# Patient Record
Sex: Female | Born: 1990 | Race: Black or African American | Hispanic: No | Marital: Married | Smoking: Never smoker
Health system: Southern US, Community
[De-identification: ages and names within clinical notes are randomized; demographics above are authoritative.]

## PROBLEM LIST (undated history)

## (undated) DIAGNOSIS — O039 Complete or unspecified spontaneous abortion without complication: Secondary | ICD-10-CM

## (undated) DIAGNOSIS — F419 Anxiety disorder, unspecified: Secondary | ICD-10-CM

## (undated) DIAGNOSIS — D649 Anemia, unspecified: Secondary | ICD-10-CM

## (undated) DIAGNOSIS — R7303 Prediabetes: Secondary | ICD-10-CM

## (undated) DIAGNOSIS — I10 Essential (primary) hypertension: Secondary | ICD-10-CM

## (undated) HISTORY — DX: Complete or unspecified spontaneous abortion without complication: O03.9

## (undated) HISTORY — DX: Anemia, unspecified: D64.9

## (undated) HISTORY — PX: TOOTH EXTRACTION: SUR596

## (undated) HISTORY — DX: Essential (primary) hypertension: I10

---

## 2012-05-14 ENCOUNTER — Emergency Department (HOSPITAL_COMMUNITY)
Admission: EM | Admit: 2012-05-14 | Discharge: 2012-05-15 | Disposition: A | Discharge status: Home / Self Care | Payer: BC Managed Care – PPO | Attending: Emergency Medicine | Admitting: Emergency Medicine

## 2012-05-14 ENCOUNTER — Emergency Department (HOSPITAL_COMMUNITY): Payer: BC Managed Care – PPO

## 2012-05-14 ENCOUNTER — Encounter (HOSPITAL_COMMUNITY): Payer: Self-pay

## 2012-05-14 DIAGNOSIS — R52 Pain, unspecified: Secondary | ICD-10-CM | POA: Insufficient documentation

## 2012-05-14 DIAGNOSIS — R079 Chest pain, unspecified: Secondary | ICD-10-CM | POA: Insufficient documentation

## 2012-05-14 DIAGNOSIS — J069 Acute upper respiratory infection, unspecified: Secondary | ICD-10-CM

## 2012-05-14 DIAGNOSIS — J029 Acute pharyngitis, unspecified: Secondary | ICD-10-CM | POA: Insufficient documentation

## 2012-05-14 DIAGNOSIS — M542 Cervicalgia: Secondary | ICD-10-CM | POA: Insufficient documentation

## 2012-05-14 DIAGNOSIS — J3489 Other specified disorders of nose and nasal sinuses: Secondary | ICD-10-CM | POA: Insufficient documentation

## 2012-05-14 LAB — BASIC METABOLIC PANEL
BUN: 6 mg/dL (ref 6–23)
Chloride: 101 mEq/L (ref 96–112)
GFR calc non Af Amer: >90 mL/min (ref >90)
Glucose, Bld: 72 mg/dL (ref 70–99)
Potassium: 3.9 mEq/L (ref 3.5–5.1)
Sodium: 135 mEq/L (ref 135–145)

## 2012-05-14 LAB — CBC
HCT: 34.2 % — ABNORMAL LOW (ref 36.0–46.0)
Hemoglobin: 11.1 g/dL — ABNORMAL LOW (ref 12.0–15.0)
RBC: 4.79 MIL/uL (ref 3.87–5.11)
WBC: 6.3 10*3/uL (ref 4.0–10.5)

## 2012-05-14 MED ORDER — IBUPROFEN 800 MG PO TABS
800.0000 mg | ORAL_TABLET | ORAL | Status: AC
  Administered 2012-05-14: 800 mg via ORAL
  Filled 2012-05-14: qty 1

## 2012-05-14 MED ORDER — IBUPROFEN 800 MG PO TABS: 800.0000 mg | ORAL_TABLET | Freq: Three times a day (TID) | ORAL | Status: DC

## 2012-05-14 MED ORDER — ACETAMINOPHEN-CODEINE 120-12 MG/5ML PO SUSP
5.0000 mL | Freq: Four times a day (QID) | ORAL | Status: DC | PRN
Start: 2012-05-14 — End: 2016-12-19

## 2012-05-14 MED ORDER — ACETAMINOPHEN-CODEINE 120-12 MG/5ML PO SUSP: 5.0000 mL | Freq: Four times a day (QID) | ORAL | Status: DC | PRN

## 2012-05-14 MED ORDER — ACETAMINOPHEN 325 MG PO TABS
650.0000 mg | ORAL_TABLET | Freq: Once | ORAL | Status: AC
  Administered 2012-05-14: 650 mg via ORAL
  Filled 2012-05-14: qty 2

## 2012-05-14 NOTE — ED Notes (Signed)
Patient presents with c/o neck pain that radiates down back and left knee as well as a productive cough x 3 days. Reports green sputum. Unsure of any fevers. Endorses sweats, chills and chest tightness. Denies any N/V, diarrhea, constipation, abd pain, headaches or dizziness.

## 2012-05-14 NOTE — ED Provider Notes (Addendum)
History     CSN: [id number]  Arrival date & time 05/14/12  1921   None     Chief Complaint  Patient presents with  . Cough  . Neck Pain    (Consider location/radiation/quality/duration/timing/severity/associated sxs/prior treatment) HPI HX per PT.  Sick x 3 days with sore throat, chills, body caches and not feeling well. Mod in severity. Dry cough, no sputum production.  Has chest pain sharp with cough only.  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 No pertinent past medical history.  History reviewed. No pertinent past surgical history.  No family history on file.  History  Substance Use Topics  . Smoking status: Never Smoker   . Smokeless tobacco: Never Used  . Alcohol Use: Yes    OB History   Grav Para Term Preterm Abortions TAB SAB Ect Mult Living                  Review of Systems  Constitutional: Positive for fever and chills.  HENT: Positive for congestion and sore throat. Negative for neck pain and neck stiffness.   Eyes: Negative for pain.  Respiratory: Positive for cough. Negative for shortness of breath and wheezing.   Cardiovascular: Positive for chest pain.  Gastrointestinal: Negative for abdominal pain.  Genitourinary: Negative for dysuria.  Musculoskeletal: Negative for back pain.  Skin: Negative for rash.  Neurological: Negative for headaches.  All other systems reviewed and are negative.    Allergies  Review of patient's allergies indicates no known allergies.  Home Medications  No current outpatient prescriptions on file.  BP 154/100  Pulse 100  Temp(Src) 101.2 F (38.4 C) (Oral)  Resp 18  SpO2 100%  LMP 05/09/2012  Physical Exam  Constitutional: She is oriented to person, place, and time. She appears well-developed and well-nourished.  HENT:  Head: Normocephalic and atraumatic.  Mouth/Throat: Oropharynx is clear and moist. No oropharyngeal exudate.  Uvula midline, TMS clear, nasal  congestion  Eyes: Conjunctivae and EOM are normal. Pupils are equal, round, and reactive to light. No scleral icterus.  Neck: Normal range of motion. Neck supple. No tracheal deviation present. No thyromegaly present.  Cardiovascular: Normal rate, regular rhythm and intact distal pulses.   Pulmonary/Chest: Effort normal and breath sounds normal. No respiratory distress. She exhibits no tenderness.  Abdominal: Soft. Bowel sounds are normal. She exhibits no distension. There is no tenderness.  Musculoskeletal: Normal range of motion. She exhibits no edema and no tenderness.  Lymphadenopathy:    She has no cervical adenopathy.  Neurological: She is alert and oriented to person, place, and time.  Skin: Skin is warm and dry. No rash noted.    ED Course  Procedures (including critical care time)  Results for orders placed during the hospital encounter of 05/14/12  CBC      Result Value Range   WBC 6.3  4.0 - 10.5 K/uL   RBC 4.79  3.87 - 5.11 MIL/uL   Hemoglobin 11.1 (*) 12.0 - 15.0 g/dL   HCT 57.8 (*) 46.9 - 62.9 %   MCV 71.4 (*) 78.0 - 100.0 fL   MCH 23.2 (*) 26.0 - 34.0 pg   MCHC 32.5  30.0 - 36.0 g/dL   RDW 52.8  41.3 - 24.4 %   Platelets 353  150 - 400 K/uL  BASIC METABOLIC PANEL      Result Value Range   Sodium 135  135 - 145 mEq/L   Potassium 3.9  3.5 - 5.1 mEq/L   Chloride 101  96 - 112 mEq/L   CO2 21  19 - 32 mEq/L   Glucose, Bld 72  70 - 99 mg/dL   BUN 6  6 - 23 mg/dL   Creatinine, Ser 1.61  0.50 - 1.10 mg/dL   Calcium 9.0  8.4 - 09.6 mg/dL   GFR calc non Af Amer >90  >90 mL/min   GFR calc Af Amer >90  >90 mL/min   Dg Chest 2 View  05/14/2012  *RADIOLOGY REPORT*  Clinical Data:  Cough, chest pain and back pain.  CHEST - 2 VIEW  Comparison: None  Findings: The heart size and mediastinal contours are within normal limits.  Both lungs are clear.  The visualized skeletal structures are unremarkable.  IMPRESSION: No active disease.   Original Report Authenticated By: Irish Lack, M.D.       Date: 05/14/2012  Rate: 92  Rhythm: normal sinus rhythm  QRS Axis: normal  Intervals: normal  ST/T Wave abnormalities: nonspecific ST changes  Conduction Disutrbances:none  Narrative Interpretation:   Old EKG Reviewed: none available  Motrin and tylenol  MDM  URI symptoms  Medications provided  CXR, labs  WN and precautions provided  VS and nursing notes reviewed        Sunnie Nielsen, MD 05/14/12 2324  Sunnie Nielsen, MD 05/16/12 [phone]

## 2012-05-15 NOTE — ED Notes (Signed)
Rx x 2.  Pt voiced understanding to f/u with PCP and return for worsening condition.  

## 2014-01-27 ENCOUNTER — Other Ambulatory Visit: Payer: Self-pay | Admitting: Obstetrics and Gynecology

## 2014-01-27 DIAGNOSIS — N644 Mastodynia: Secondary | ICD-10-CM

## 2014-01-29 ENCOUNTER — Ambulatory Visit
Admission: RE | Admit: 2014-01-29 | Discharge: 2014-01-29 | Disposition: A | Discharge status: Home / Self Care | Payer: BC Managed Care – PPO | Source: Ambulatory Visit | Attending: Obstetrics and Gynecology | Admitting: Obstetrics and Gynecology

## 2014-01-29 DIAGNOSIS — N644 Mastodynia: Secondary | ICD-10-CM

## 2014-12-29 ENCOUNTER — Encounter (HOSPITAL_COMMUNITY): Payer: Self-pay | Admitting: *Deleted

## 2014-12-29 ENCOUNTER — Emergency Department (HOSPITAL_COMMUNITY)
Admission: EM | Admit: 2014-12-29 | Discharge: 2014-12-29 | Disposition: A | Discharge status: Home / Self Care | Payer: Self-pay | Attending: Emergency Medicine | Admitting: Emergency Medicine

## 2014-12-29 DIAGNOSIS — S99922A Unspecified injury of left foot, initial encounter: Secondary | ICD-10-CM | POA: Insufficient documentation

## 2014-12-29 DIAGNOSIS — Y9389 Activity, other specified: Secondary | ICD-10-CM | POA: Insufficient documentation

## 2014-12-29 DIAGNOSIS — S3991XA Unspecified injury of abdomen, initial encounter: Secondary | ICD-10-CM | POA: Insufficient documentation

## 2014-12-29 DIAGNOSIS — Y999 Unspecified external cause status: Secondary | ICD-10-CM | POA: Insufficient documentation

## 2014-12-29 DIAGNOSIS — Y92410 Unspecified street and highway as the place of occurrence of the external cause: Secondary | ICD-10-CM | POA: Insufficient documentation

## 2014-12-29 DIAGNOSIS — E669 Obesity, unspecified: Secondary | ICD-10-CM | POA: Insufficient documentation

## 2014-12-29 MED ORDER — METHOCARBAMOL 500 MG PO TABS: 500.0000 mg | ORAL_TABLET | Freq: Two times a day (BID) | ORAL | Status: DC

## 2014-12-29 MED ORDER — IBUPROFEN 600 MG PO TABS: 600.0000 mg | ORAL_TABLET | Freq: Four times a day (QID) | ORAL | Status: DC | PRN
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
## 2014-12-29 NOTE — ED Notes (Signed)
Cartner, PA at bedside.
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
## 2016-12-18 NOTE — Progress Notes (Signed)
Subjective:    Patient ID: Shelby Chandler, female    DOB: [birth year], 26 y.o.   MRN: [id number] Chief Complaint  Patient presents with  . Annual Exam    HPI  Shelby Chandler is a 26 yo woman who is new to our office (and Epic system) here today for a complete physical. Moved here from [place], [place] 6 yrs ago and never found a dr and has not had a dr since. Is not fasting - had a smoothie.  Primary Preventative Screenings: Cervical Cancer:  No h/o abnml - last pap was 2016 (off of [facility]) Family Planning: currently not active, uses condoms.  Used OCPs when mom made her around 89-16 yo for <1 mo but none since. Periods reg -4d heavy, 5th light, scheduled. No vag d/c.  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 Does not have rx for valtrex at home avail but would like to discuss. Would like STI screening today. Breast Cancer: NA due to age Colorectal Cancer: NA due to age Tobacco use/EtOH/substances: none, occ glass of wine. 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 BMI Readings from Last 3 Encounters:  12/29/14 59.88 kg/m   No results found for: HGBA1C OTC/Vit/Supp/Herbal: b12, biotin. Gets plentry of calcium sources in her diet.  Dentist/Optho: due for optho in next few months. Dentist in ~ 1 yr Immunizations: unsure if she received the HPV series - got all standard vaccines up until 11-20 yo. Remembers getting a vaccine at her pre-college CPE - likely TDaP.   There is  no immunization history on file for this patient.  Father w/ DM and CAD. Mother w/ lupus, RA, and mental illness.  Chronic Medical Conditions: ? IDA - pt not sure. Is vegetarian. H/o low iron when dontes blood but never started supplementing.   History reviewed. No pertinent past medical history. History reviewed. No pertinent surgical history. No current outpatient medications on file prior to visit.   No current facility-administered medications on file prior to visit.    No Known Allergies History reviewed. No pertinent family history. Social History   Socioeconomic History  . Marital status: Single    Spouse name: None  . Number of children: None  . Years of education: None  . Highest education level: None  Social Needs  . Financial resource strain: None  . Food insecurity - worry: None  . Food insecurity - inability: None  . Transportation needs - medical: None  . Transportation needs - non-medical: None  Occupational History  . None  Tobacco Use  . Smoking status: Never Smoker  . Smokeless tobacco: Never Used  Substance and Sexual Activity  . Alcohol use: Yes    Comment: social  . Drug use: No  . Sexual activity: Not Currently    Birth control/protection: None  Other Topics Concern  . None  Social History Narrative  . None   No flowsheet data found.    Review of Systems See  hpi    Objective:   Physical Exam  Constitutional: She is oriented to person, place, and time. She appears well-developed and well-nourished. No distress.  HENT:  Head: Normocephalic and atraumatic.  Right Ear: Tympanic membrane, external ear and ear canal normal.  Left Ear: Tympanic membrane, external ear and ear canal normal.  Nose: Mucosal edema present. No rhinorrhea.  Mouth/Throat: Uvula is midline, oropharynx is clear and moist and mucous membranes are normal. No posterior oropharyngeal erythema.  Eyes: Conjunctivae and EOM are normal. Pupils are equal, round, and reactive  to light. Right eye exhibits no discharge. Left eye exhibits no discharge. No scleral icterus.  Neck: Normal range of motion. Neck supple. No thyromegaly present.  Cardiovascular: Normal rate, regular rhythm, normal heart sounds and intact distal pulses.  Pulmonary/Chest: Effort normal and breath sounds normal. No respiratory distress.  Abdominal: Soft. Bowel sounds are normal. There is no tenderness.  Genitourinary: Vagina normal and uterus normal. No breast swelling, tenderness, discharge or bleeding. Cervix exhibits no motion tenderness and no friability. Right adnexum displays no mass and no tenderness. Left adnexum displays no mass and no tenderness.  Musculoskeletal: She exhibits no edema.  Lymphadenopathy:    She has no cervical adenopathy.  Neurological: She is alert and oriented to person, place, and time. She has normal reflexes.  Skin: Skin is warm and dry. She is not diaphoretic. No erythema.  Psychiatric: She has a normal mood and affect. Her behavior is normal.      Pulse 80   Temp 99 F (37.2 C)   Resp 16   Ht 5\' 6"  (1.676 m)   Wt (!) 383 lb (173.7 kg)   SpO2 100%   BMI 61.82 kg/m      Assessment & Plan:  Ua, tsh, cbc, cmp, lipid, a1c, ferritin  Pap w/ reflex to HPV, std testing Refuses Flu shot, but will do tdap - pt did not get today - left before it was done - do next visit.  1. Annual physical exam   2. Routine screening for STI (sexually transmitted infection)   3. Screening for cardiovascular, respiratory, and genitourinary diseases   4. Screening for cervical cancer   5. Screening for deficiency anemia   6. Screening for thyroid disorder   7. Class 3 severe obesity due to excess calories without serious comorbidity with body mass index (BMI) of 60.0 to 69.9 in adult ([place])   8. Anemia, unspecified type   9. HSV-2 infection     Orders Placed This Encounter  Procedures  . Tdap vaccine greater than or equal to 7yo IM  . TSH  . CBC with  Differential/Platelet  . Comprehensive metabolic panel    Order Specific Question:   Has the patient fasted?    Answer:   Yes  . Ferritin  . Hemoglobin A1c  . HIV antibody  . HCV Ab w/Rflx to Verification  . RPR  . RPR  . HIV antibody  . HCV Ab w/Rflx to Verification  . Interpretation:  . POCT urinalysis dipstick    Meds ordered this encounter  Medications  . valACYclovir (VALTREX) 1000 MG tablet    Sig: Take 1 tablet (1,000 mg total) daily for 5 days by mouth. At first sign of outbreak    Dispense:  20 tablet    Refill:  1  . triamterene-hydrochlorothiazide (MAXZIDE-25) 37.5-25 MG tablet    Sig: Take 1 tablet daily by mouth.    Dispense:  30 tablet    Refill:  [place], M.D.  [facility] [address] [place], [place] [address] [phone] phone [phone] fax  12/21/16 11:11 PM

## 2016-12-19 ENCOUNTER — Other Ambulatory Visit: Payer: Self-pay

## 2016-12-19 ENCOUNTER — Ambulatory Visit (INDEPENDENT_AMBULATORY_CARE_PROVIDER_SITE_OTHER): Payer: Managed Care, Other (non HMO) | Admitting: Family Medicine

## 2016-12-19 ENCOUNTER — Encounter: Payer: Self-pay | Admitting: Family Medicine

## 2016-12-19 VITALS — BP 164/108 | HR 80 | Temp 99.0°F | Resp 16 | Ht 66.0 in | Wt 383.0 lb

## 2016-12-19 DIAGNOSIS — Z1389 Encounter for screening for other disorder: Secondary | ICD-10-CM

## 2016-12-19 DIAGNOSIS — Z0000 Encounter for general adult medical examination without abnormal findings: Secondary | ICD-10-CM | POA: Diagnosis not present

## 2016-12-19 DIAGNOSIS — Z1329 Encounter for screening for other suspected endocrine disorder: Secondary | ICD-10-CM

## 2016-12-19 DIAGNOSIS — Z130 Encounter for screening for diseases of the blood and blood-forming organs and certain disorders involving the immune mechanism: Secondary | ICD-10-CM

## 2016-12-19 DIAGNOSIS — Z6841 Body Mass Index (BMI) 40.0 and over, adult: Secondary | ICD-10-CM

## 2016-12-19 DIAGNOSIS — Z1383 Encounter for screening for respiratory disorder NEC: Secondary | ICD-10-CM | POA: Diagnosis not present

## 2016-12-19 DIAGNOSIS — Z124 Encounter for screening for malignant neoplasm of cervix: Secondary | ICD-10-CM

## 2016-12-19 DIAGNOSIS — E66813 Obesity, class 3: Secondary | ICD-10-CM

## 2016-12-19 DIAGNOSIS — Z113 Encounter for screening for infections with a predominantly sexual mode of transmission: Secondary | ICD-10-CM | POA: Diagnosis not present

## 2016-12-19 DIAGNOSIS — Z136 Encounter for screening for cardiovascular disorders: Secondary | ICD-10-CM | POA: Diagnosis not present

## 2016-12-19 DIAGNOSIS — D649 Anemia, unspecified: Secondary | ICD-10-CM

## 2016-12-19 DIAGNOSIS — B009 Herpesviral infection, unspecified: Secondary | ICD-10-CM

## 2016-12-19 LAB — POCT URINALYSIS DIP (MANUAL ENTRY)
BILIRUBIN UA: NEGATIVE mg/dL
Bilirubin, UA: NEGATIVE
GLUCOSE UA: NEGATIVE mg/dL
Leukocytes, UA: NEGATIVE
Nitrite, UA: NEGATIVE
PROTEIN UA: NEGATIVE mg/dL
RBC UA: NEGATIVE
Spec Grav, UA: 1.02 (ref 1.010–1.025)
UROBILINOGEN UA: 0.2 U/dL
pH, UA: 6.5 (ref 5.0–8.0)

## 2016-12-19 MED ORDER — TRIAMTERENE-HCTZ 37.5-25 MG PO TABS: 1.0000 | ORAL_TABLET | Freq: Every day | ORAL | 1 refills | Status: DC

## 2016-12-19 MED ORDER — VALACYCLOVIR HCL 1 G PO TABS: 1000.0000 mg | ORAL_TABLET | Freq: Every day | ORAL | 1 refills | Status: AC

## 2016-12-19 NOTE — Patient Instructions (Addendum)
If you would like to talk more and address contraception, your back pain, weight loss or anything else we BRIEFLY touched on/mentioned today - come back to see me at any time. We do have evening and weekend appointments available.   Start the fluid pill once a day in the morning. Recheck with me 3 weeks to discuss your response, make sure it is the right pill for you in the right dose and check an EKG to ensure there has been no signs of strain in your heart. Please come FASTING to that appointment - nothing to eat for 8-12 hours before hand, but DO take your medication that morning with water.   Hypertension Hypertension, commonly called high blood pressure, is when the force of blood pumping through the arteries is too strong. The arteries are the blood vessels that carry blood from the heart throughout the body. Hypertension forces the heart to work harder to pump blood and may cause arteries to become narrow or stiff. Having untreated or uncontrolled hypertension can cause heart attacks, strokes, kidney disease, and other problems. A blood pressure reading consists of a higher number over a lower number. Ideally, your blood pressure should be below 120/80. The first ("top") number is called the systolic pressure. It is a measure of the pressure in your arteries as your heart beats. The second ("bottom") number is called the diastolic pressure. It is a measure of the pressure in your arteries as the heart relaxes. What are the causes? The cause of this condition is not known. What increases the risk? Some risk factors for high blood pressure are under your control. Others are not. Factors you can change  Smoking.  Having type 2 diabetes mellitus, high cholesterol, or both.  Not getting enough exercise or physical activity.  Being overweight.  Having too much fat, sugar, calories, or salt (sodium) in your diet.  Drinking too much alcohol. Factors that are difficult or impossible to  change  Having chronic kidney disease.  Having a family history of high blood pressure.  Age. Risk increases with age.  Race. You may be at higher risk if you are African-American.  Gender. Men are at higher risk than women before age 94. After age 46, women are at higher risk than men.  Having obstructive sleep apnea.  Stress. What are the signs or symptoms? Extremely high blood pressure (hypertensive crisis) may cause:  Headache.  Anxiety.  Shortness of breath.  Nosebleed.  Nausea and vomiting.  Severe chest pain.  Jerky movements you cannot control (seizures).  How is this diagnosed? This condition is diagnosed by measuring your blood pressure while you are seated, with your arm resting on a surface. The cuff of the blood pressure monitor will be placed directly against the skin of your upper arm at the level of your heart. It should be measured at least twice using the same arm. Certain conditions can cause a difference in blood pressure between your right and left arms. Certain factors can cause blood pressure readings to be lower or higher than normal (elevated) for a short period of time:  When your blood pressure is higher when you are in a health care provider's office than when you are at home, this is called white coat hypertension. Most people with this condition do not need medicines.  When your blood pressure is higher at home than when you are in a health care provider's office, this is called masked hypertension. Most people with this condition may need  medicines to control blood pressure.  If you have a high blood pressure reading during one visit or you have normal blood pressure with other risk factors:  You may be asked to return on a different day to have your blood pressure checked again.  You may be asked to monitor your blood pressure at home for 1 week or longer.  If you are diagnosed with hypertension, you may have other blood or imaging tests  to help your health care provider understand your overall risk for other conditions. How is this treated? This condition is treated by making healthy lifestyle changes, such as eating healthy foods, exercising more, and reducing your alcohol intake. Your health care provider may prescribe medicine if lifestyle changes are not enough to get your blood pressure under control, and if:  Your systolic blood pressure is above 130.  Your diastolic blood pressure is above 80.  Your personal target blood pressure may vary depending on your medical conditions, your age, and other factors. Follow these instructions at home: Eating and drinking  Eat a diet that is high in fiber and potassium, and low in sodium, added sugar, and fat. An example eating plan is called the DASH (Dietary Approaches to Stop Hypertension) diet. To eat this way: ? Eat plenty of fresh fruits and vegetables. Try to fill half of your plate at each meal with fruits and vegetables. ? Eat whole grains, such as whole wheat pasta, brown rice, or whole grain bread. Fill about one quarter of your plate with whole grains. ? Eat or drink low-fat dairy products, such as skim milk or low-fat yogurt. ? Avoid fatty cuts of meat, processed or cured meats, and poultry with skin. Fill about one quarter of your plate with lean proteins, such as fish, chicken without skin, beans, eggs, and tofu. ? Avoid premade and processed foods. These tend to be higher in sodium, added sugar, and fat.  Reduce your daily sodium intake. Most people with hypertension should eat less than 1,500 mg of sodium a day.  Limit alcohol intake to no more than 1 drink a day for nonpregnant women and 2 drinks a day for men. One drink equals 12 oz of beer, 5 oz of wine, or 1 oz of hard liquor. Lifestyle  Work with your health care provider to maintain a healthy body weight or to lose weight. Ask what an ideal weight is for you.  Get at least 30 minutes of exercise that  causes your heart to beat faster (aerobic exercise) most days of the week. Activities may include walking, swimming, or biking.  Include exercise to strengthen your muscles (resistance exercise), such as pilates or lifting weights, as part of your weekly exercise routine. Try to do these types of exercises for 30 minutes at least 3 days a week.  Do not use any products that contain nicotine or tobacco, such as cigarettes and e-cigarettes. If you need help quitting, ask your health care provider.  Monitor your blood pressure at home as told by your health care provider.  Keep all follow-up visits as told by your health care provider. This is important. Medicines  Take over-the-counter and prescription medicines only as told by your health care provider. Follow directions carefully. Blood pressure medicines must be taken as prescribed.  Do not skip doses of blood pressure medicine. Doing this puts you at risk for problems and can make the medicine less effective.  Ask your health care provider about side effects or reactions to medicines  that you should watch for. Contact a health care provider if:  You think you are having a reaction to a medicine you are taking.  You have headaches that keep coming back (recurring).  You feel dizzy.  You have swelling in your ankles.  You have trouble with your vision. Get help right away if:  You develop a severe headache or confusion.  You have unusual weakness or numbness.  You feel faint.  You have severe pain in your chest or abdomen.  You vomit repeatedly.  You have trouble breathing. Summary  Hypertension is when the force of blood pumping through your arteries is too strong. If this condition is not controlled, it may put you at risk for serious complications.  Your personal target blood pressure may vary depending on your medical conditions, your age, and other factors. For most people, a normal blood pressure is less than  120/80.  Hypertension is treated with lifestyle changes, medicines, or a combination of both. Lifestyle changes include weight loss, eating a healthy, low-sodium diet, exercising more, and limiting alcohol. This information is not intended to replace advice given to you by your health care provider. Make sure you discuss any questions you have with your health care provider. Document Released: 01/24/2005 Document Revised: 12/23/2015 Document Reviewed: 12/23/2015 Elsevier Interactive Patient Education  2018 [place] Maintenance, Female Adopting a healthy lifestyle and getting preventive care can go a long way to promote health and wellness. Talk with your health care provider about what schedule of regular examinations is right for you. This is a good chance for you to check in with your provider about disease prevention and staying healthy. In between checkups, there are plenty of things you can do on your own. Experts have done a lot of research about which lifestyle changes and preventive measures are most likely to keep you healthy. Ask your health care provider for more information. Weight and diet Eat a healthy diet  Be sure to include plenty of vegetables, fruits, low-fat dairy products, and lean protein.  Do not eat a lot of foods high in solid fats, added sugars, or salt.  Get regular exercise. This is one of the most important things you can do for your health. ? Most adults should exercise for at least 150 minutes each week. The exercise should increase your heart rate and make you sweat (moderate-intensity exercise). ? Most adults should also do strengthening exercises at least twice a week. This is in addition to the moderate-intensity exercise.  Maintain a healthy weight  Body mass index (BMI) is a measurement that can be used to identify possible weight problems. It estimates body fat based on height and weight. Your health care provider can help determine your  BMI and help you achieve or maintain a healthy weight.  For females 76 years of age and older: ? A BMI below 18.5 is considered underweight. ? A BMI of 18.5 to 24.9 is normal. ? A BMI of 25 to 29.9 is considered overweight. ? A BMI of 30 and above is considered obese.  Watch levels of cholesterol and blood lipids  You should start having your blood tested for lipids and cholesterol at 27 years of age, then have this test every 5 years.  You may need to have your cholesterol levels checked more often if: ? Your lipid or cholesterol levels are high. ? You are older than 27 years of age. ? You are at high risk for heart disease.  Cancer screening Lung Cancer  Lung cancer screening is recommended for adults 55-28 years old who are at high risk for lung cancer because of a history of smoking.  A yearly low-dose CT scan of the lungs is recommended for people who: ? Currently smoke. ? Have quit within the past 15 years. ? Have at least a 30-pack-year history of smoking. A pack year is smoking an average of one pack of cigarettes a day for 1 year.  Yearly screening should continue until it has been 15 years since you quit.  Yearly screening should stop if you develop a health problem that would prevent you from having lung cancer treatment.  Breast Cancer  Practice breast self-awareness. This means understanding how your breasts normally appear and feel.  It also means doing regular breast self-exams. Let your health care provider know about any changes, no matter how small.  If you are in your 20s or 30s, you should have a clinical breast exam (CBE) by a health care provider every 1-3 years as part of a regular health exam.  If you are 30 or older, have a CBE every year. Also consider having a breast X-ray (mammogram) every year.  If you have a family history of breast cancer, talk to your health care provider about genetic screening.  If you are at high risk for breast cancer,  talk to your health care provider about having an MRI and a mammogram every year.  Breast cancer gene (BRCA) assessment is recommended for women who have family members with BRCA-related cancers. BRCA-related cancers include: ? Breast. ? Ovarian. ? Tubal. ? Peritoneal cancers.  Results of the assessment will determine the need for genetic counseling and BRCA1 and BRCA2 testing.  Cervical Cancer Your health care provider may recommend that you be screened regularly for cancer of the pelvic organs (ovaries, uterus, and vagina). This screening involves a pelvic examination, including checking for microscopic changes to the surface of your cervix (Pap test). You may be encouraged to have this screening done every 3 years, beginning at age 62.  For women ages 1-65, health care providers may recommend pelvic exams and Pap testing every 3 years, or they may recommend the Pap and pelvic exam, combined with testing for human papilloma virus (HPV), every 5 years. Some types of HPV increase your risk of cervical cancer. Testing for HPV may also be done on women of any age with unclear Pap test results.  Other health care providers may not recommend any screening for nonpregnant women who are considered low risk for pelvic cancer and who do not have symptoms. Ask your health care provider if a screening pelvic exam is right for you.  If you have had past treatment for cervical cancer or a condition that could lead to cancer, you need Pap tests and screening for cancer for at least 20 years after your treatment. If Pap tests have been discontinued, your risk factors (such as having a new sexual partner) need to be reassessed to determine if screening should resume. Some women have medical problems that increase the chance of getting cervical cancer. In these cases, your health care provider may recommend more frequent screening and Pap tests.  Colorectal Cancer  This type of cancer can be detected and often  prevented.  Routine colorectal cancer screening usually begins at 27 years of age and continues through 27 years of age.  Your health care provider may recommend screening at an earlier age if you have risk factors  for colon cancer.  Your health care provider may also recommend using home test kits to check for hidden blood in the stool.  A small camera at the end of a tube can be used to examine your colon directly (sigmoidoscopy or colonoscopy). This is done to check for the earliest forms of colorectal cancer.  Routine screening usually begins at age 19.  Direct examination of the colon should be repeated every 5-10 years through 27 years of age. However, you may need to be screened more often if early forms of precancerous polyps or small growths are found.  Skin Cancer  Check your skin from head to toe regularly.  Tell your health care provider about any new moles or changes in moles, especially if there is a change in a mole's shape or color.  Also tell your health care provider if you have a mole that is larger than the size of a pencil eraser.  Always use sunscreen. Apply sunscreen liberally and repeatedly throughout the day.  Protect yourself by wearing long sleeves, pants, a wide-brimmed hat, and sunglasses whenever you are outside.  Heart disease, diabetes, and high blood pressure  High blood pressure causes heart disease and increases the risk of stroke. High blood pressure is more likely to develop in: ? People who have blood pressure in the high end of the normal range (130-139/85-89 mm Hg). ? People who are overweight or obese. ? People who are African American.  If you are 76-46 years of age, have your blood pressure checked every 3-5 years. If you are 64 years of age or older, have your blood pressure checked every year. You should have your blood pressure measured twice-once when you are at a hospital or clinic, and once when you are not at a hospital or clinic.  Record the average of the two measurements. To check your blood pressure when you are not at a hospital or clinic, you can use: ? An automated blood pressure machine at a pharmacy. ? A home blood pressure monitor.  If you are between 60 years and 54 years old, ask your health care provider if you should take aspirin to prevent strokes.  Have regular diabetes screenings. This involves taking a blood sample to check your fasting blood sugar level. ? If you are at a normal weight and have a low risk for diabetes, have this test once every three years after 27 years of age. ? If you are overweight and have a high risk for diabetes, consider being tested at a younger age or more often. Preventing infection Hepatitis B  If you have a higher risk for hepatitis B, you should be screened for this virus. You are considered at high risk for hepatitis B if: ? You were born in a country where hepatitis B is common. Ask your health care provider which countries are considered high risk. ? Your parents were born in a high-risk country, and you have not been immunized against hepatitis B (hepatitis B vaccine). ? You have HIV or AIDS. ? You use needles to inject street drugs. ? You live with someone who has hepatitis B. ? You have had sex with someone who has hepatitis B. ? You get hemodialysis treatment. ? You take certain medicines for conditions, including cancer, organ transplantation, and autoimmune conditions.  Hepatitis C  Blood testing is recommended for: ? Everyone born from [birth year] through 1966. ? Anyone with known risk factors for hepatitis C.  Sexually transmitted infections (STIs)  You  should be screened for sexually transmitted infections (STIs) including gonorrhea and chlamydia if: ? You are sexually active and are younger than 27 years of age. ? You are older than 27 years of age and your health care provider tells you that you are at risk for this type of infection. ? Your sexual  activity has changed since you were last screened and you are at an increased risk for chlamydia or gonorrhea. Ask your health care provider if you are at risk.  If you do not have HIV, but are at risk, it may be recommended that you take a prescription medicine daily to prevent HIV infection. This is called pre-exposure prophylaxis (PrEP). You are considered at risk if: ? You are sexually active and do not regularly use condoms or know the HIV status of your partner(s). ? You take drugs by injection. ? You are sexually active with a partner who has HIV.  Talk with your health care provider about whether you are at high risk of being infected with HIV. If you choose to begin PrEP, you should first be tested for HIV. You should then be tested every 3 months for as long as you are taking PrEP. Pregnancy  If you are premenopausal and you may become pregnant, ask your health care provider about preconception counseling.  If you may become pregnant, take 400 to 800 micrograms (mcg) of folic acid every day.  If you want to prevent pregnancy, talk to your health care provider about birth control (contraception). Osteoporosis and menopause  Osteoporosis is a disease in which the bones lose minerals and strength with aging. This can result in serious bone fractures. Your risk for osteoporosis can be identified using a bone density scan.  If you are 44 years of age or older, or if you are at risk for osteoporosis and fractures, ask your health care provider if you should be screened.  Ask your health care provider whether you should take a calcium or vitamin D supplement to lower your risk for osteoporosis.  Menopause may have certain physical symptoms and risks.  Hormone replacement therapy may reduce some of these symptoms and risks. Talk to your health care provider about whether hormone replacement therapy is right for you. Follow these instructions at home:  Schedule regular health, dental,  and eye exams.  Stay current with your immunizations.  Do not use any tobacco products including cigarettes, chewing tobacco, or electronic cigarettes.  If you are pregnant, do not drink alcohol.  If you are breastfeeding, limit how much and how often you drink alcohol.  Limit alcohol intake to no more than 1 drink per day for nonpregnant women. One drink equals 12 ounces of beer, 5 ounces of wine, or 1 ounces of hard liquor.  Do not use street drugs.  Do not share needles.  Ask your health care provider for help if you need support or information about quitting drugs.  Tell your health care provider if you often feel depressed.  Tell your health care provider if you have ever been abused or do not feel safe at home. This information is not intended to replace advice given to you by your health care provider. Make sure you discuss any questions you have with your health care provider. Document Released: 08/09/2010 Document Revised: 07/02/2015 Document Reviewed: 10/28/2014 Elsevier Interactive Patient Education  2018 [employer]. Contraception Choices Contraception (birth control) is the use of any methods or devices to prevent pregnancy. Below are some methods to help  avoid pregnancy. Hormonal methods  Contraceptive implant. This is a thin, plastic tube containing progesterone hormone. It does not contain estrogen hormone. Your health care provider inserts the tube in the inner part of the upper arm. The tube can remain in place for up to 3 years. After 3 years, the implant must be removed. The implant prevents the ovaries from releasing an egg (ovulation), thickens the cervical mucus to prevent sperm from entering the uterus, and thins the lining of the inside of the uterus.  Progesterone-only injections. These injections are given every 3 months by your health care provider to prevent pregnancy. This synthetic progesterone hormone stops the ovaries from releasing eggs. It also  thickens cervical mucus and changes the uterine lining. This makes it harder for sperm to survive in the uterus.  Birth control pills. These pills contain estrogen and progesterone hormone. They work by preventing the ovaries from releasing eggs (ovulation). They also cause the cervical mucus to thicken, preventing the sperm from entering the uterus. Birth control pills are prescribed by a health care provider.Birth control pills can also be used to treat heavy periods.  Minipill. This type of birth control pill contains only the progesterone hormone. They are taken every day of each month and must be prescribed by your health care provider.  Birth control patch. The patch contains hormones similar to those in birth control pills. It must be changed once a week and is prescribed by a health care provider.  Vaginal ring. The ring contains hormones similar to those in birth control pills. It is left in the vagina for 3 weeks, removed for 1 week, and then a new one is put back in place. The patient must be comfortable inserting and removing the ring from the vagina.A health care provider's prescription is necessary.  Emergency contraception. Emergency contraceptives prevent pregnancy after unprotected sexual intercourse. This pill can be taken right after sex or up to 5 days after unprotected sex. It is most effective the sooner you take the pills after having sexual intercourse. Most emergency contraceptive pills are available without a prescription. Check with your pharmacist. Do not use emergency contraception as your only form of birth control. Barrier methods  Female condom. This is a thin sheath (latex or rubber) that is worn over the penis during sexual intercourse. It can be used with spermicide to increase effectiveness.  Female condom. This is a soft, loose-fitting sheath that is put into the vagina before sexual intercourse.  Diaphragm. This is a soft, latex, dome-shaped barrier that must be  fitted by a health care provider. It is inserted into the vagina, along with a spermicidal jelly. It is inserted before intercourse. The diaphragm should be left in the vagina for 6 to 8 hours after intercourse.  Cervical cap. This is a round, soft, latex or plastic cup that fits over the cervix and must be fitted by a health care provider. The cap can be left in place for up to 48 hours after intercourse.  Sponge. This is a soft, circular piece of polyurethane foam. The sponge has spermicide in it. It is inserted into the vagina after wetting it and before sexual intercourse.  Spermicides. These are chemicals that kill or block sperm from entering the cervix and uterus. They come in the form of creams, jellies, suppositories, foam, or tablets. They do not require a prescription. They are inserted into the vagina with an applicator before having sexual intercourse. The process must be repeated every time you have  sexual intercourse. Intrauterine contraception  Intrauterine device (IUD). This is a T-shaped device that is put in a woman's uterus during a menstrual period to prevent pregnancy. There are 2 types: ? Copper IUD. This type of IUD is wrapped in copper wire and is placed inside the uterus. Copper makes the uterus and fallopian tubes produce a fluid that kills sperm. It can stay in place for 10 years. ? Hormone IUD. This type of IUD contains the hormone progestin (synthetic progesterone). The hormone thickens the cervical mucus and prevents sperm from entering the uterus, and it also thins the uterine lining to prevent implantation of a fertilized egg. The hormone can weaken or kill the sperm that get into the uterus. It can stay in place for 3-5 years, depending on which type of IUD is used. Permanent methods of contraception  Female tubal ligation. This is when the woman's fallopian tubes are surgically sealed, tied, or blocked to prevent the egg from traveling to the uterus.  Hysteroscopic  sterilization. This involves placing a small coil or insert into each fallopian tube. Your doctor uses a technique called hysteroscopy to do the procedure. The device causes scar tissue to form. This results in permanent blockage of the fallopian tubes, so the sperm cannot fertilize the egg. It takes about 3 months after the procedure for the tubes to become blocked. You must use another form of birth control for these 3 months.  Female sterilization. This is when the female has the tubes that carry sperm tied off (vasectomy).This blocks sperm from entering the vagina during sexual intercourse. After the procedure, the man can still ejaculate fluid (semen). Natural planning methods  Natural family planning. This is not having sexual intercourse or using a barrier method (condom, diaphragm, cervical cap) on days the woman could become pregnant.  Calendar method. This is keeping track of the length of each menstrual cycle and identifying when you are fertile.  Ovulation method. This is avoiding sexual intercourse during ovulation.  Symptothermal method. This is avoiding sexual intercourse during ovulation, using a thermometer and ovulation symptoms.  Post-ovulation method. This is timing sexual intercourse after you have ovulated. Regardless of which type or method of contraception you choose, it is important that you use condoms to protect against the transmission of sexually transmitted infections (STIs). Talk with your health care provider about which form of contraception is most appropriate for you. This information is not intended to replace advice given to you by your health care provider. Make sure you discuss any questions you have with your health care provider. Document Released: 01/24/2005 Document Revised: 07/02/2015 Document Reviewed: 07/19/2012 Elsevier Interactive Patient Education  2017 [employer].  Exercising to [employer] Exercising can help you to lose weight. In order to lose  weight through exercise, you need to do vigorous-intensity exercise. You can tell that you are exercising with vigorous intensity if you are breathing very hard and fast and cannot hold a conversation while exercising. Moderate-intensity exercise helps to maintain your current weight. You can tell that you are exercising at a moderate level if you have a higher heart rate and faster breathing, but you are still able to hold a conversation. How often should I exercise? Choose an activity that you enjoy and set realistic goals. Your health care provider can help you to make an activity plan that works for you. Exercise regularly as directed by your health care provider. This may include:  Doing resistance training twice each week, such as: ? Push-ups. ?  Sit-ups. ? Lifting weights. ? Using resistance bands.  Doing a given intensity of exercise for a given amount of time. Choose from these options: ? 150 minutes of moderate-intensity exercise every week. ? 75 minutes of vigorous-intensity exercise every week. ? A mix of moderate-intensity and vigorous-intensity exercise every week.  Children, pregnant women, people who are out of shape, people who are overweight, and older adults may need to consult a health care provider for individual recommendations. If you have any sort of medical condition, be sure to consult your health care provider before starting a new exercise program. What are some activities that can help me to lose weight?  Walking at a rate of at least 4.5 miles an hour.  Jogging or running at a rate of 5 miles per hour.  Biking at a rate of at least 10 miles per hour.  Lap swimming.  Roller-skating or in-line skating.  Cross-country skiing.  Vigorous competitive sports, such as football, basketball, and soccer.  Jumping rope.  Aerobic dancing. How can I be more active in my day-to-day activities?  Use the stairs instead of the elevator.  Take a walk during your  lunch break.  If you drive, park your car farther away from work or school.  If you take public transportation, get off one stop early and walk the rest of the way.  Make all of your phone calls while standing up and walking around.  Get up, stretch, and walk around every 30 minutes throughout the day. What guidelines should I follow while exercising?  Do not exercise so much that you hurt yourself, feel dizzy, or get very short of breath.  Consult your health care provider prior to starting a new exercise program.  Wear comfortable clothes and shoes with good support.  Drink plenty of water while you exercise to prevent dehydration or heat stroke. Body water is lost during exercise and must be replaced.  Work out until you breathe faster and your heart beats faster. This information is not intended to replace advice given to you by your health care provider. Make sure you discuss any questions you have with your health care provider. Document Released: 02/26/2010 Document Revised: 07/02/2015 Document Reviewed: 06/27/2013 Elsevier Interactive Patient Education  [employer].

## 2016-12-20 LAB — HEMOGLOBIN A1C
Est. average glucose Bld gHb Est-mCnc: 111 mg/dL
Hgb A1c MFr Bld: 5.5 % (ref 4.8–5.6)

## 2016-12-20 LAB — COMPREHENSIVE METABOLIC PANEL
A/G RATIO: 1.2 (ref 1.2–2.2)
ALK PHOS: 61 IU/L (ref 39–117)
ALT: 9 IU/L (ref 0–32)
AST: 11 IU/L (ref 0–40)
Albumin: 4.2 g/dL (ref 3.5–5.5)
BILIRUBIN TOTAL: 0.2 mg/dL (ref 0.0–1.2)
BUN/Creatinine Ratio: 8 — ABNORMAL LOW (ref 9–23)
BUN: 6 mg/dL (ref 6–20)
CO2: 24 mmol/L (ref 20–29)
Calcium: 9.3 mg/dL (ref 8.7–10.2)
Chloride: 100 mmol/L (ref 96–106)
Creatinine, Ser: 0.79 mg/dL (ref 0.57–1.00)
GFR calc Af Amer: 119 mL/min/{1.73_m2} (ref >59)
GFR, EST NON AFRICAN AMERICAN: 103 mL/min/{1.73_m2} (ref >59)
GLOBULIN, TOTAL: 3.6 g/dL (ref 1.5–4.5)
Glucose: 77 mg/dL (ref 65–99)
POTASSIUM: 3.9 mmol/L (ref 3.5–5.2)
SODIUM: 141 mmol/L (ref 134–144)
Total Protein: 7.8 g/dL (ref 6.0–8.5)

## 2016-12-20 LAB — CBC WITH DIFFERENTIAL/PLATELET
BASOS: 0 %
Basophils Absolute: 0 10*3/uL (ref 0.0–0.2)
EOS (ABSOLUTE): 0.1 10*3/uL (ref 0.0–0.4)
EOS: 1 %
HEMATOCRIT: 33.7 % — AB (ref 34.0–46.6)
Hemoglobin: 10.6 g/dL — ABNORMAL LOW (ref 11.1–15.9)
Immature Grans (Abs): 0 10*3/uL (ref 0.0–0.1)
Immature Granulocytes: 0 %
LYMPHS ABS: 2.1 10*3/uL (ref 0.7–3.1)
Lymphs: 37 %
MCH: 21.2 pg — AB (ref 26.6–33.0)
MCHC: 31.5 g/dL (ref 31.5–35.7)
MCV: 68 fL — AB (ref 79–97)
MONOS ABS: 0.5 10*3/uL (ref 0.1–0.9)
Monocytes: 8 %
Neutrophils Absolute: 3 10*3/uL (ref 1.4–7.0)
Neutrophils: 54 %
Platelets: 512 10*3/uL — ABNORMAL HIGH (ref 150–379)
RBC: 4.99 x10E6/uL (ref 3.77–5.28)
RDW: 17 % — AB (ref 12.3–15.4)
WBC: 5.6 10*3/uL (ref 3.4–10.8)

## 2016-12-20 LAB — RPR: RPR: NONREACTIVE

## 2016-12-20 LAB — FERRITIN: FERRITIN: 9 ng/mL — AB (ref 15–150)

## 2016-12-20 LAB — HIV ANTIBODY (ROUTINE TESTING W REFLEX): HIV Screen 4th Generation wRfx: NONREACTIVE

## 2016-12-20 LAB — HCV AB W/RFLX TO VERIFICATION: HCV Ab: <0.1 s/co ratio (ref 0.0–0.9)

## 2016-12-20 LAB — TSH: TSH: 2.5 u[IU]/mL (ref 0.450–4.500)

## 2016-12-20 LAB — HCV INTERPRETATION

## 2016-12-21 LAB — PAP IG, CT-NG, RFX HPV ASCU
CHLAMYDIA, NUC. ACID AMP: NEGATIVE
GONOCOCCUS BY NUCLEIC ACID AMP: NEGATIVE
PAP Smear Comment: 0

## 2016-12-26 ENCOUNTER — Ambulatory Visit: Payer: Self-pay | Admitting: *Deleted

## 2016-12-26 NOTE — Telephone Encounter (Signed)
I was in on [place] (Nov 12th) and had a pelvic exam by Dr. Brigitte Pulse.   During the exam I felt something pop and I was hurting.   Ever since then about every day I get a sharp abd pain that radiates from left to right across my abd that doubles me over.  It lasts about 5 minutes then goes away. I got an appt with Ivar Drape, PA-C for Nov. 21st (at pt request) at 9:20am.  Instructed to call back if she becomes worse.   She verbalized understanding.  Reason for Disposition . [1] MODERATE pain (e.g., interferes with normal activities) AND [2] pain comes and goes (cramps) AND [3] present > 24 hours  (Exception: pain with Vomiting or Diarrhea - see that Guideline)  Answer Assessment - Initial Assessment Questions 1. LOCATION: "Where does it hurt?"      I had a PAP smear done on Nov. 12th.  Ever since she did the 2 finger exam I felt like something popped and it has hurt on and off since.   It's a sharp pain that goes across my abd and makes me double over in pain.   2. RADIATION: "Does the pain shoot anywhere else?" (e.g., chest, back)     Just in my abd from left to right across my abd. 3. ONSET: "When did the pain begin?" (e.g., minutes, hours or days ago)      Nov 12th during my vaginal exam 4. SUDDEN: "Gradual or sudden onset?"     Sudden pain that comes and goes.  It's sharp for about 5 minutes then it goes away. 5. PATTERN "Does the pain come and go, or is it constant?"    - If constant: "Is it getting better, staying the same, or worsening?"      (Note: Constant means the pain never goes away completely; most serious pain is constant and it progresses)     - If intermittent: "How long does it last?" "Do you have pain now?"     (Note: Intermittent means the pain goes away completely between bouts)     Comes and and go  Happens about once a day since the exam.  It like a twisting pain. 6. SEVERITY: "How bad is the pain?"  (e.g., Scale 1-10; mild, moderate, or severe)   - MILD (1-3):  doesn't interfere with normal activities, abdomen soft and not tender to touch    - MODERATE (4-7): interferes with normal activities or awakens from sleep, tender to touch    - SEVERE (8-10): excruciating pain, doubled over, unable to do any normal activities      Doubled over with pain a 10 for about 5 minutes then gradually subsides. 7. RECURRENT SYMPTOM: "Have you ever had this type of abdominal pain before?" If so, ask: "When was the last time?" and "What happened that time?"      No 8. CAUSE: "What do you think is causing the abdominal pain?"     The pelvic exam 9. RELIEVING/AGGRAVATING FACTORS: "What makes it better or worse?" (e.g., movement, antacids, bowel movement)     If I'm standing straight it hurts worse.  If I bend over it helps it not to hurt so bad.    10. OTHER SYMPTOMS: "Has there been any vomiting, diarrhea, constipation, or urine problems?"       No symptoms 11. PREGNANCY: "Is there any chance you are pregnant?" "When was your last menstrual period?"       I don't  think so.  Slime chance.   Oct 29th last period.  Protocols used: ABDOMINAL PAIN [facility]

## 2016-12-28 ENCOUNTER — Ambulatory Visit: Payer: Managed Care, Other (non HMO) | Admitting: Physician Assistant

## 2016-12-28 ENCOUNTER — Encounter: Payer: Self-pay | Admitting: Physician Assistant

## 2016-12-28 ENCOUNTER — Other Ambulatory Visit: Payer: Self-pay

## 2016-12-28 ENCOUNTER — Ambulatory Visit (HOSPITAL_COMMUNITY)
Admission: RE | Admit: 2016-12-28 | Discharge: 2016-12-28 | Disposition: A | Discharge status: Home / Self Care | Payer: 59 | Source: Ambulatory Visit | Attending: Physician Assistant | Admitting: Physician Assistant

## 2016-12-28 ENCOUNTER — Other Ambulatory Visit: Payer: Self-pay | Admitting: Physician Assistant

## 2016-12-28 VITALS — BP 127/85 | HR 71 | Temp 98.0°F | Resp 16 | Ht 65.5 in | Wt 377.2 lb

## 2016-12-28 DIAGNOSIS — R1033 Periumbilical pain: Secondary | ICD-10-CM | POA: Diagnosis not present

## 2016-12-28 DIAGNOSIS — R109 Unspecified abdominal pain: Secondary | ICD-10-CM | POA: Diagnosis not present

## 2016-12-28 DIAGNOSIS — R82998 Other abnormal findings in urine: Secondary | ICD-10-CM

## 2016-12-28 LAB — POCT CBC
GRANULOCYTE PERCENT: 50.9 % (ref 37–80)
HEMATOCRIT: 34 % — AB (ref 37.7–47.9)
Hemoglobin: 10.8 g/dL — AB (ref 12.2–16.2)
Lymph, poc: 2 (ref 0.6–3.4)
MCH: 21.7 pg — AB (ref 27–31.2)
MCHC: 31.7 g/dL — AB (ref 31.8–35.4)
MCV: 68.7 fL — AB (ref 80–97)
MID (CBC): 0.5 (ref 0–0.9)
MPV: 7.2 fL (ref 0–99.8)
PLATELET COUNT, POC: 499 10*3/uL — AB (ref 142–424)
POC Granulocyte: 2.5 (ref 2–6.9)
POC LYMPH %: 39.8 % (ref 10–50)
POC MID %: 9.3 %M (ref 0–12)
RBC: 4.95 M/uL (ref 4.04–5.48)
RDW, POC: 16.3 %
WBC: 5 10*3/uL (ref 4.6–10.2)

## 2016-12-28 LAB — POCT URINALYSIS DIP (MANUAL ENTRY)
BILIRUBIN UA: NEGATIVE
BILIRUBIN UA: NEGATIVE mg/dL
Blood, UA: NEGATIVE
GLUCOSE UA: NEGATIVE mg/dL
NITRITE UA: NEGATIVE
Protein Ur, POC: NEGATIVE mg/dL
Spec Grav, UA: 1.015 (ref 1.010–1.025)
Urobilinogen, UA: 0.2 E.U./dL
pH, UA: 7 (ref 5.0–8.0)

## 2016-12-28 LAB — POC MICROSCOPIC URINALYSIS (UMFC): Mucus: ABSENT

## 2016-12-28 LAB — POCT URINE PREGNANCY: Preg Test, Ur: NEGATIVE

## 2016-12-28 NOTE — Patient Instructions (Addendum)
You are scheduled for an Ultrasound at [facility]. Please go directly to the main entrance of the hospital and they will guide you to the radiology department to have your scan.     IF you received an x-ray today, you will receive an invoice from [facility]. Please contact [facility] at [phone] with questions or concerns regarding your invoice.   IF you received labwork today, you will receive an invoice from [place]. Please contact [facility] at [phone] with questions or concerns regarding your invoice.   Our billing staff will not be able to assist you with questions regarding bills from these companies.  You will be contacted with the lab results as soon as they are available. The fastest way to get your results is to activate your My Chart account. Instructions are located on the last page of this paperwork. If you have not heard from [place] regarding the results in 2 weeks, please contact this office.

## 2016-12-28 NOTE — Progress Notes (Signed)
PRIMARY CARE AT [facility] [address], [place] [address] [phone]  Date:  12/28/2016   Name:  Shelby Chandler   DOB:  May 05, 1990   MRN:  [id number]  PCP:  Darcus Austin, RN    History of Present Illness:  Shelby Chandler is a 26 y.o. female patient who presents to PCP with  Chief Complaint  Patient presents with  . Abdominal Pain    "sharp pain" straight across belly button, since last vs on 12/19/16      She states that during the bi-manual exam, she felt a sharp pain.  She felt a pain on the left side when applied pressure during the bimanual exam.  The next day she developed. Sharp pain one minute, and then doubled over with pain for 5 minutes, that then goes away.  10/10 when it is its worse.  Starts at 5-10/10.  Pressure or bedning over will help the relief.  No nausea.  No swelling to the abdomen.  No abnormal vaginal bleeding.  No dysuria, hematuria, or frequency.  No trouble with bowel movements--blood or melena.   Sexually active protected 3-4 weeks ago.  12/05/2016 She has no abominal cramps generally with her periods.  She has    There are no active problems to display for this patient.   History reviewed. No pertinent past medical history.  History reviewed. No pertinent surgical history.  Social History   Tobacco Use  . Smoking status: Never Smoker  . Smokeless tobacco: Never Used  Substance Use Topics  . Alcohol use: Yes    Comment: social  . Drug use: No    History reviewed. No pertinent family history.  No Known Allergies  Medication list has been reviewed and updated.  Current Outpatient Medications on File Prior to Visit  Medication Sig Dispense Refill  . triamterene-hydrochlorothiazide (MAXZIDE-25) 37.5-25 MG tablet Take 1 tablet daily by mouth. 30 tablet 1   No current facility-administered medications on file prior to visit.     ROS ROS otherwise unremarkable unless listed above.  Physical Examination: BP 127/85    Pulse 71   Temp 98 F (36.7 C) (Oral)   Resp 16   Ht 5' 5.5" (1.664 m)   Wt (!) 377 lb 3.2 oz (171.1 kg)   LMP 12/05/2016   SpO2 100%   BMI 61.81 kg/m  Ideal Body Weight: Weight in (lb) to have BMI = 25: 152.2  Physical Exam  Results for orders placed or performed in visit on 12/28/16  POCT urine pregnancy  Result Value Ref Range   Preg Test, Ur Negative Negative  POCT urinalysis dipstick  Result Value Ref Range   Color, UA yellow yellow   Clarity, UA clear clear   Glucose, UA negative negative mg/dL   Bilirubin, UA negative negative   Ketones, POC UA negative negative mg/dL   Spec Grav, UA 1.015 1.010 - 1.025   Blood, UA negative negative   pH, UA 7.0 5.0 - 8.0   Protein Ur, POC negative negative mg/dL   Urobilinogen, UA 0.2 0.2 or 1.0 E.U./dL   Nitrite, UA Negative Negative   Leukocytes, UA Trace (A) Negative  POCT CBC  Result Value Ref Range   WBC 5.0 4.6 - 10.2 K/uL   Lymph, poc 2.0 0.6 - 3.4   POC LYMPH PERCENT 39.8 10 - 50 %L   MID (cbc) 0.5 0 - 0.9   POC MID % 9.3 0 - 12 %M   POC Granulocyte 2.5  2 - 6.9   Granulocyte percent 50.9 37 - 80 %G   RBC 4.95 4.04 - 5.48 M/uL   Hemoglobin 10.8 (A) 12.2 - 16.2 g/dL   HCT, POC 34.0 (A) 37.7 - 47.9 %   MCV 68.7 (A) 80 - 97 fL   MCH, POC 21.7 (A) 27 - 31.2 pg   MCHC 31.7 (A) 31.8 - 35.4 g/dL   RDW, POC 16.3 %   Platelet Count, POC 499 (A) 142 - 424 K/uL   MPV 7.2 0 - 99.8 fL    Assessment and Plan: Shelby Chandler is a 26 y.o. female who is here today for cc of  Chief Complaint  Patient presents with  . Abdominal Pain    "sharp pain" straight across belly button, since last vs on 12/19/16  ultrasound obtained.  Normal.  Advised anti-inflammatory Abdominal cramping - Plan: POCT urine pregnancy, POCT Microscopic Urinalysis ([facility])  Periumbilical abdominal pain - Plan: POCT urinalysis dipstick, POCT CBC, CANCELED: US Abdomen Complete  Leukocytes in urine - Plan: POCT Microscopic Urinalysis  ([facility])  Ivar Drape, PA-C [facility] 11/27/20187:25 AM

## 2017-01-03 ENCOUNTER — Telehealth: Payer: Self-pay | Admitting: Physician Assistant

## 2017-01-03 NOTE — Telephone Encounter (Signed)
Copied from [place] #[id number]. Topic: Quick Communication - See Telephone Encounter >> Jan 03, 2017 11:43 AM Ahmed Prima L wrote: CRM for notification. See Telephone encounter for Patient said she would like someone to call her with her results from the 11/21 appt. Call back @ [phone]  01/03/17.

## 2017-01-03 NOTE — Telephone Encounter (Signed)
Please advise The imaging was not telling. Advise ibuprofen 600 my every 8 hours. If she still had pain I will obtain a cat scan, the labs that were obtained at our visit were given during visit. If she still has pain, she should return

## 2017-01-03 NOTE — Telephone Encounter (Signed)
pl

## 2017-01-03 NOTE — Telephone Encounter (Signed)
Please review

## 2017-01-04 ENCOUNTER — Encounter: Payer: Self-pay | Admitting: Physician Assistant

## 2017-01-04 ENCOUNTER — Ambulatory Visit: Payer: Managed Care, Other (non HMO) | Admitting: Physician Assistant

## 2017-01-04 ENCOUNTER — Ambulatory Visit (INDEPENDENT_AMBULATORY_CARE_PROVIDER_SITE_OTHER): Payer: Managed Care, Other (non HMO)

## 2017-01-04 ENCOUNTER — Other Ambulatory Visit: Payer: Self-pay

## 2017-01-04 VITALS — BP 130/78 | HR 85 | Temp 98.5°F | Resp 18 | Ht 65.5 in | Wt 376.8 lb

## 2017-01-04 DIAGNOSIS — R1084 Generalized abdominal pain: Secondary | ICD-10-CM | POA: Diagnosis not present

## 2017-01-04 LAB — POCT URINALYSIS DIP (MANUAL ENTRY)
Bilirubin, UA: NEGATIVE
Glucose, UA: NEGATIVE mg/dL
Ketones, POC UA: NEGATIVE mg/dL
NITRITE UA: NEGATIVE
PH UA: 6.5 (ref 5.0–8.0)
PROTEIN UA: NEGATIVE mg/dL
Spec Grav, UA: 1.015 (ref 1.010–1.025)
Urobilinogen, UA: 0.2 E.U./dL

## 2017-01-04 MED ORDER — POLYETHYLENE GLYCOL 3350 17 GM/SCOOP PO POWD: 17.0000 g | Freq: Two times a day (BID) | ORAL | 1 refills | Status: DC | PRN

## 2017-01-04 NOTE — Progress Notes (Signed)
PRIMARY CARE AT [facility] [address] [place] [address] [phone]  Date:  01/04/2017   Name:  Shelby Chandler   DOB:  October 29, 1990   MRN:  [id number]  PCP:  Darcus Austin, RN    History of Present Illness:  Shelby Chandler is a 26 y.o. female patient who presents to PCP with  Chief Complaint  Patient presents with  . Abdominal Pain    follow up pt states it is getting worse      Patient continues to have the sharp abdominal pain from last week.  She complains of a left sided periumbical pain that radiates toward the right.  wihtout associated symptoms of blood in stool, abnormal vaginal bleeding, fever, dysuria.  She reports that the pain still comes on intermittently.  She has noticed that the pain increased in duration when they do end Reaching the end of menstrual cycle at this time Reports  A normal bowel movement about 2 hours ago. She is a vegetarian.  Drinks <64oz.  Gets a lot of greens. There are no active problems to display for this patient.   No past medical history on file.  No past surgical history on file.  Social History   Tobacco Use  . Smoking status: Never Smoker  . Smokeless tobacco: Never Used  Substance Use Topics  . Alcohol use: Yes    Comment: social  . Drug use: No    No family history on file.  No Known Allergies  Medication list has been reviewed and updated.  Current Outpatient Medications on File Prior to Visit  Medication Sig Dispense Refill  . triamterene-hydrochlorothiazide (MAXZIDE-25) 37.5-25 MG tablet Take 1 tablet daily by mouth. 30 tablet 1   No current facility-administered medications on file prior to visit.     ROS ROS otherwise unremarkable unless listed above.  Physical Examination: BP 130/78   Pulse 85   Temp 98.5 F (36.9 C) (Oral)   Resp 18   Ht 5' 5.5" (1.664 m)   Wt (!) 376 lb 12.8 oz (170.9 kg)   LMP 12/05/2016   SpO2 100%   BMI 61.75 kg/m  Ideal Body Weight: Weight in (lb) to have  BMI = 25: 152.2  Physical Exam  Constitutional: She is oriented to person, place, and time. She appears well-developed and well-nourished. No distress.  HENT:  Head: Normocephalic and atraumatic.  Right Ear: External ear normal.  Left Ear: External ear normal.  Eyes: Conjunctivae and EOM are normal. Pupils are equal, round, and reactive to light.  Cardiovascular: Normal rate.  Pulmonary/Chest: Effort normal. No respiratory distress.  Abdominal: Soft. Normal appearance and bowel sounds are normal. There is tenderness in the periumbilical area and left upper quadrant.  Neurological: She is alert and oriented to person, place, and time.  Skin: She is not diaphoretic.  Psychiatric: She has a normal mood and affect. Her behavior is normal.   Dg Abd 1 View  Result Date: 01/04/2017 CLINICAL DATA:  Abdominal pain for 2 weeks EXAM: ABDOMEN - 1 VIEW COMPARISON:  None. FINDINGS: There are no disproportionally dilated loops of bowel. There is no obvious free intraperitoneal gas. No definitive abnormal calcifications. IMPRESSION: Nonobstructive bowel gas pattern. Electronically Signed   By: Marybelle Killings M.D.   On: 01/04/2017 08:49   US Abdomen Limited  Result Date: 12/28/2016 CLINICAL DATA:  Periumbilical abdominal pain. Palpable mass with tenderness. EXAM: ULTRASOUND ABDOMEN LIMITED COMPARISON:  None. FINDINGS: No ultrasound evidence of mass or hernia at  the site of periumbilical left-sided abdominal pain. IMPRESSION: No ultrasound correlate for the area of pain. Electronically Signed   By: Abigail Miyamoto M.D.   On: 12/28/2016 12:43     Assessment and Plan: Shelby Chandler is a 26 y.o. female who is here today for cc of  Chief Complaint  Patient presents with  . Abdominal Pain    follow up pt states it is getting worse   bowel gas apparent at the area of the abdominal pain. I have advised using miralax at this time.  She will do less than 1 week.  Follow up with Dr. Brigitte Pulse on Monday.  I  have advised her to return for alarming symptoms which we had discussed.  Generalized abdominal pain - Plan: DG Abd 1 View, POCT urinalysis dipstick, polyethylene glycol powder (GLYCOLAX/MIRALAX) powder  Ivar Drape, PA-C [facility] 11/28/20186:25 PM

## 2017-01-04 NOTE — Patient Instructions (Addendum)
   Constipation, Adult Constipation is when a person:  Poops (has a bowel movement) fewer times in a week than normal.  Has a hard time pooping.  Has poop that is dry, hard, or bigger than normal.  Follow these instructions at home: Eating and drinking   Eat foods that have a lot of fiber, such as: ? Fresh fruits and vegetables. ? Whole grains. ? Beans.  Eat less of foods that are high in fat, low in fiber, or overly processed, such as: ? [place] fries. ? Hamburgers. ? Cookies. ? Candy. ? Soda.  Drink enough fluid to keep your pee (urine) clear or pale yellow. General instructions  Exercise regularly or as told by your doctor.  Go to the restroom when you feel like you need to poop. Do not hold it in.  Take over-the-counter and prescription medicines only as told by your doctor. These include any fiber supplements.  Do pelvic floor retraining exercises, such as: ? Doing deep breathing while relaxing your lower belly (abdomen). ? Relaxing your pelvic floor while pooping.  Watch your condition for any changes.  Keep all follow-up visits as told by your doctor. This is important. Contact a doctor if:  You have pain that gets worse.  You have a fever.  You have not pooped for 4 days.  You throw up (vomit).  You are not hungry.  You lose weight.  You are bleeding from the anus.  You have thin, pencil-like poop (stool). Get help right away if:  You have a fever, and your symptoms suddenly get worse.  You leak poop or have blood in your poop.  Your belly feels hard or bigger than normal (is bloated).  You have very bad belly pain.  You feel dizzy or you faint. This information is not intended to replace advice given to you by your health care provider. Make sure you discuss any questions you have with your health care provider. Document Released: 07/13/2007 Document Revised: 08/14/2015 Document Reviewed: 07/15/2015 Elsevier Interactive Patient Education   2017 [employer].    IF you received an x-ray today, you will receive an invoice from [facility]. Please contact [facility] at [phone] with questions or concerns regarding your invoice.   IF you received labwork today, you will receive an invoice from [place]. Please contact [facility] at [phone] with questions or concerns regarding your invoice.   Our billing staff will not be able to assist you with questions regarding bills from these companies.  You will be contacted with the lab results as soon as they are available. The fastest way to get your results is to activate your My Chart account. Instructions are located on the last page of this paperwork. If you have not heard from [place] regarding the results in 2 weeks, please contact this office.

## 2017-01-06 ENCOUNTER — Encounter: Payer: Self-pay | Admitting: Physician Assistant

## 2017-01-09 ENCOUNTER — Ambulatory Visit: Payer: Managed Care, Other (non HMO) | Admitting: Family Medicine

## 2017-01-09 ENCOUNTER — Encounter: Payer: Self-pay | Admitting: Family Medicine

## 2017-01-09 ENCOUNTER — Telehealth: Payer: Self-pay | Admitting: Family Medicine

## 2017-01-09 ENCOUNTER — Other Ambulatory Visit: Payer: Self-pay

## 2017-01-09 VITALS — BP 162/108 | HR 80 | Temp 98.3°F | Resp 16 | Ht 65.5 in | Wt 377.4 lb

## 2017-01-09 DIAGNOSIS — I10 Essential (primary) hypertension: Secondary | ICD-10-CM | POA: Diagnosis not present

## 2017-01-09 DIAGNOSIS — R1031 Right lower quadrant pain: Secondary | ICD-10-CM | POA: Diagnosis not present

## 2017-01-09 DIAGNOSIS — D473 Essential (hemorrhagic) thrombocythemia: Secondary | ICD-10-CM | POA: Diagnosis not present

## 2017-01-09 DIAGNOSIS — Z5181 Encounter for therapeutic drug level monitoring: Secondary | ICD-10-CM

## 2017-01-09 DIAGNOSIS — Z23 Encounter for immunization: Secondary | ICD-10-CM | POA: Diagnosis not present

## 2017-01-09 DIAGNOSIS — R3129 Other microscopic hematuria: Secondary | ICD-10-CM | POA: Diagnosis not present

## 2017-01-09 DIAGNOSIS — D500 Iron deficiency anemia secondary to blood loss (chronic): Secondary | ICD-10-CM | POA: Insufficient documentation

## 2017-01-09 DIAGNOSIS — D75839 Thrombocytosis, unspecified: Secondary | ICD-10-CM

## 2017-01-09 DIAGNOSIS — Z6841 Body Mass Index (BMI) 40.0 and over, adult: Secondary | ICD-10-CM | POA: Diagnosis not present

## 2017-01-09 LAB — POC MICROSCOPIC URINALYSIS (UMFC): Mucus: ABSENT

## 2017-01-09 LAB — POCT URINALYSIS DIP (MANUAL ENTRY)
BILIRUBIN UA: NEGATIVE mg/dL
Bilirubin, UA: NEGATIVE
Glucose, UA: NEGATIVE mg/dL
LEUKOCYTES UA: NEGATIVE
Nitrite, UA: NEGATIVE
PH UA: 7 (ref 5.0–8.0)
PROTEIN UA: NEGATIVE mg/dL
RBC UA: NEGATIVE
SPEC GRAV UA: 1.015 (ref 1.010–1.025)
Urobilinogen, UA: 0.2 E.U./dL

## 2017-01-09 LAB — POCT CBC
GRANULOCYTE PERCENT: 54.3 % (ref 37–80)
HCT, POC: 32.8 % — AB (ref 37.7–47.9)
HEMOGLOBIN: 10.5 g/dL — AB (ref 12.2–16.2)
Lymph, poc: 2.2 (ref 0.6–3.4)
MCH: 21.4 pg — AB (ref 27–31.2)
MCHC: 32 g/dL (ref 31.8–35.4)
MCV: 67 fL — AB (ref 80–97)
MID (cbc): 0.6 (ref 0–0.9)
MPV: 7.3 fL (ref 0–99.8)
PLATELET COUNT, POC: 559 10*3/uL — AB (ref 142–424)
POC Granulocyte: 3.4 (ref 2–6.9)
POC LYMPH PERCENT: 36 %L (ref 10–50)
POC MID %: 9.7 % (ref 0–12)
RBC: 4.89 M/uL (ref 4.04–5.48)
RDW, POC: 16.9 %
WBC: 6.2 10*3/uL (ref 4.6–10.2)

## 2017-01-09 MED ORDER — AMLODIPINE BESYLATE 5 MG PO TABS: 5.0000 mg | ORAL_TABLET | Freq: Every day | ORAL | 0 refills | Status: DC

## 2017-01-09 NOTE — Progress Notes (Signed)
[name]   

## 2017-01-09 NOTE — Telephone Encounter (Addendum)
THIS MESSAGE IS FOR DR. SHAW.  PATIENT SAW YOU ON 01/09/2017. I TOLD HER ABOUT A $25.00 BALANCE SHE HAD FROM 12/19/2016. SHE SAID SHE HAD HER PHYSICAL DONE THAT DAY AND THAT THERE SHOULD NOT BE A CHARGE. SHE SAID DR. SHAW DID NOT DO ANYTHING OUTSIDE OF HER NORMAL ANNUAL. PAUL IN BILLING LOOKED AT HER CHART AND SAW THAT DR. SHAW DID DISCUSS OTHER THINGS WITH HER THAT DAY. SHE PAID THE $25.00 CO-PAY, BUT SHE SAID SHE DID NOT WANT TO SCHEDULE HER FOLLOW-UP VISIT FOR SAT. 01/21/2017. DR. Brigitte Pulse -  I JUST WANTED YOU TO BE AWARE. [place]

## 2017-01-09 NOTE — Telephone Encounter (Signed)
Number is disconnected.

## 2017-01-09 NOTE — Progress Notes (Signed)
Subjective:    Patient ID: Shelby Chandler, female    DOB: 05/10/1990, 26 y.o.   MRN: [id number] Chief Complaint  Patient presents with  . Follow-up    3 wk f/u from physical     HPI  Started on maxzide at last visit Still haven't pooped normally but can feel it moving down - 1 before work and 1 sev hrs before bed - and hasn't really noticed anything bid. Is vegetarian so eats a lot of cheese which is likely making things worse - does do a lot of yogurt, propel water -> trying to swtich to regular water.  Is regular - food runs right trhough her usually but since the 12th pelvic exam she has been more bloated - abd more wide - usually 4-5x/d at her baseline which has improved since she became a vegetarian.  Her stomach is cramping and she has the urge to defeicate but nothing happened despite 30 min of attempt.  Consider drinking coffee.  Is faasting today. Drank 4 bottles of water and yest just had a bag of cheezeit - no nausea but no appetite. Can hear the gas in her stomach but not passing any, no burping, no  No pain with pooping, no hemorrhoids, no blood, no melena.   Used to wake w/ a severe HA and those resolved since starting the maxzide but those resolved - used to feel like head was going to explose.   History reviewed. No pertinent past medical history. History reviewed. No pertinent surgical history. Current Outpatient Medications on File Prior to Visit  Medication Sig Dispense Refill  . polyethylene glycol powder (GLYCOLAX/MIRALAX) powder Take 17 g by mouth 2 (two) times daily as needed. 289 g 1   No current facility-administered medications on file prior to visit.    No Known Allergies History reviewed. No pertinent family history. Social History   Socioeconomic History  . Marital status: Single    Spouse name: None  . Number of children: None  . Years of education: None  . Highest education level: None  Social Needs  . Financial resource strain: None    . Food insecurity - worry: None  . Food insecurity - inability: None  . Transportation needs - medical: None  . Transportation needs - non-medical: None  Occupational History  . None  Tobacco Use  . Smoking status: Never Smoker  . Smokeless tobacco: Never Used  Substance and Sexual Activity  . Alcohol use: Yes    Comment: social  . Drug use: No  . Sexual activity: Not Currently    Birth control/protection: None  Other Topics Concern  . None  Social History Narrative  . None   Depression screen [facility] 2/9 01/09/2017 12/28/2016  Decreased Interest 0 0  Down, Depressed, Hopeless 0 0  PHQ - 2 Score 0 0    Review of Systems See hpi    Objective:   Physical Exam  Constitutional: She is oriented to person, place, and time. She appears well-developed and well-nourished. No distress.  HENT:  Head: Normocephalic and atraumatic.  Right Ear: External ear normal.  Left Ear: External ear normal.  Eyes: Conjunctivae are normal. No scleral icterus.  Neck: Normal range of motion. Neck supple. No thyromegaly present.  Cardiovascular: Normal rate, regular rhythm, normal heart sounds and intact distal pulses.  Pulmonary/Chest: Effort normal and breath sounds normal. No respiratory distress.  Abdominal: Soft. Bowel sounds are normal. She exhibits no mass. There is no tenderness.  Musculoskeletal: She  exhibits no edema.  Lymphadenopathy:    She has no cervical adenopathy.  Neurological: She is alert and oriented to person, place, and time.  Skin: Skin is warm and dry. She is not diaphoretic. No erythema.  Psychiatric: She has a normal mood and affect. Her behavior is normal.         BP (!) 142/78   Pulse 80   Temp 98.3 F (36.8 C)   Resp 16   Ht 5' 5.5" (1.664 m)   Wt (!) 377 lb 6.4 oz (171.2 kg)   SpO2 100%   BMI 61.85 kg/m   Results for orders placed or performed in visit on 01/09/17  POCT urinalysis dipstick  Result Value Ref Range   Color, UA yellow yellow   Clarity, UA  cloudy (A) clear   Glucose, UA negative negative mg/dL   Bilirubin, UA negative negative   Ketones, POC UA negative negative mg/dL   Spec Grav, UA 1.015 1.010 - 1.025   Blood, UA negative negative   pH, UA 7.0 5.0 - 8.0   Protein Ur, POC negative negative mg/dL   Urobilinogen, UA 0.2 0.2 or 1.0 E.U./dL   Nitrite, UA Negative Negative   Leukocytes, UA Negative Negative  POCT Microscopic Urinalysis ([facility])  Result Value Ref Range   WBC,UR,HPF,POC None None WBC/hpf   RBC,UR,HPF,POC None None RBC/hpf   Bacteria Many (A) None, Too numerous to count   Mucus Absent Absent   Epithelial Cells, UR Per Microscopy Moderate (A) None, Too numerous to count cells/hpf  POCT CBC  Result Value Ref Range   WBC 6.2 4.6 - 10.2 K/uL   Lymph, poc 2.2 0.6 - 3.4   POC LYMPH PERCENT 36.0 10 - 50 %L   MID (cbc) 0.6 0 - 0.9   POC MID % 9.7 0 - 12 %M   POC Granulocyte 3.4 2 - 6.9   Granulocyte percent 54.3 37 - 80 %G   RBC 4.89 4.04 - 5.48 M/uL   Hemoglobin 10.5 (A) 12.2 - 16.2 g/dL   HCT, POC 32.8 (A) 37.7 - 47.9 %   MCV 67.0 (A) 80 - 97 fL   MCH, POC 21.4 (A) 27 - 31.2 pg   MCHC 32.0 31.8 - 35.4 g/dL   RDW, POC 16.9 %   Platelet Count, POC 559 (A) 142 - 424 K/uL   MPV 7.3 0 - 99.8 fL    Assessment & Plan:  Thrombocytosis  1. Essential hypertension   2. Other microscopic hematuria   3. Need for tetanus, diphtheria, and acellular pertussis (Tdap) vaccine in patient of adolescent age or older   60. Medication monitoring encounter   5. Iron deficiency anemia due to chronic blood loss   6. Right lower quadrant abdominal pain   7. Class 3 severe obesity due to excess calories without serious comorbidity with body mass index (BMI) of 60.0 to 69.9 in adult ([place])   8. Thrombocytosis ([place])    Poss maxzide causing constipation so will d/c and try amlodipine insteead. Avoid acei/arb since childbearing age and birth control is condoms.  Wonder if she could have had an ovarian cyst rupture and cause a  little ilius. Try mag citrate bottle today, the a miralax cleanout this weekend.  FOR SECOND VISIT IN A ROW WITH ME, TDAP WAS ORDERED BUT APPEARS TO HAVE NOT BEEN ADMINISTERED BEFORE PT LEFT.  Orders Placed This Encounter  Procedures  . Tdap vaccine greater than or equal to 7yo IM  . Basic metabolic  panel    Order Specific Question:   Has the patient fasted?    Answer:   No  . Lipid panel    Order Specific Question:   Has the patient fasted?    Answer:   Yes  . Lipid panel  . POCT urinalysis dipstick  . POCT Microscopic Urinalysis ([facility])  . POCT CBC    Meds ordered this encounter  Medications  . amLODipine (NORVASC) 5 MG tablet    Sig: Take 1 tablet (5 mg total) by mouth daily.    Dispense:  30 tablet    Refill:  0    Delman Cheadle, M.D.  Primary Care at [facility] [address] [place], [place] [address] [phone] phone [phone] fax  01/12/17 6:50 AM

## 2017-01-09 NOTE — Patient Instructions (Addendum)
Drink a bottle of magnesium citrate today.  Continue the twice daily miralax.  Go ahead a try a cup of coffee and some prunes - I think that is a great idea.  On Saturday morning- right before, during, after work, I recommend doing a miralax clean-out. Put 14 (not kidding) doses of miralax (polyethylene glycol) into 64 oz of any clear, non-carbonated liquid (apple juice, gatorade, water) and drink this WITHIN 24 HOURS!!!! For the next 2d, plan to stay home and just hang around the toilet as you will hopefully be having 8 BM/day of LIQUID stool.  If the diarrhea occurs soon after starting process without passing a significant stool volume or if you are having any fecal incontinence you should keep going as this may be the initial miralax washing around the larger stools.  Stop the maxzide. Start amlodipine.   IF you received an x-ray today, you will receive an invoice from [facility]. Please contact [facility] at [phone] with questions or concerns regarding your invoice.   IF you received labwork today, you will receive an invoice from [facility]. Please contact [facility] at [phone] with questions or concerns regarding your invoice.   Our billing staff will not be able to assist you with questions regarding bills from these companies.  You will be contacted with the lab results as soon as they are available. The fastest way to get your results is to activate your My Chart account. Instructions are located on the last page of this paperwork. If you have not heard from [place] regarding the results in 2 weeks, please contact this office.      Constipation, Adult Constipation is when a person has fewer bowel movements in a week than normal, has difficulty having a bowel movement, or has stools that are dry, hard, or larger than normal. Constipation may be caused by an underlying condition. It may become worse with age if a person takes certain medicines and does not take in  enough fluids. Follow these instructions at home: Eating and drinking   Eat foods that have a lot of fiber, such as fresh fruits and vegetables, whole grains, and beans.  Limit foods that are high in fat, low in fiber, or overly processed, such as french fries, hamburgers, cookies, candies, and soda.  Drink enough fluid to keep your urine clear or pale yellow. General instructions  Exercise regularly or as told by your health care provider.  Go to the restroom when you have the urge to go. Do not hold it in.  Take over-the-counter and prescription medicines only as told by your health care provider. These include any fiber supplements.  Practice pelvic floor retraining exercises, such as deep breathing while relaxing the lower abdomen and pelvic floor relaxation during bowel movements.  Watch your condition for any changes.  Keep all follow-up visits as told by your health care provider. This is important. Contact a health care provider if:  You have pain that gets worse.  You have a fever.  You do not have a bowel movement after 4 days.  You vomit.  You are not hungry.  You lose weight.  You are bleeding from the anus.  You have thin, pencil-like stools. Get help right away if:  You have a fever and your symptoms suddenly get worse.  You leak stool or have blood in your stool.  Your abdomen is bloated.  You have severe pain in your abdomen.  You feel dizzy or you faint. This information is  not intended to replace advice given to you by your health care provider. Make sure you discuss any questions you have with your health care provider. Document Released: 10/23/2003 Document Revised: 08/14/2015 Document Reviewed: 07/15/2015 Elsevier Interactive Patient Education  2017 [employer].  About Constipation  Constipation Overview Constipation is the most common gastrointestinal complaint - about 4 million Americans experience constipation and make 2.5 million  physician visits a year to get help for the problem.  Constipation can occur when the colon absorbs too much water, the colon's muscle contraction is slow or sluggish, and/or there is delayed transit time through the colon.  The result is stool that is hard and dry.  Indicators of constipation include straining during bowel movements greater than 25% of the time, having fewer than three bowel movements per week, and/or the feeling of incomplete evacuation.  There are established guidelines ([place] II ) for defining constipation. A person needs to have two or more of the following symptoms for at least 12 weeks (not necessarily consecutive) in the preceding 12 months: . Straining in  greater than 25% of bowel movements . Lumpy or hard stools in greater than 25% of bowel movements . Sensation of incomplete emptying in greater than 25% of bowel movements . Sensation of anorectal obstruction/blockade in greater than 25% of bowel movements . Manual maneuvers to help empty greater than 25% of bowel movements (e.g., digital evacuation, support of the pelvic floor)  . Less than  3 bowel movements/week . Loose stools are not present, and criteria for irritable bowel syndrome are insufficient  Common Causes of Constipation . Lack of fiber in your diet . Lack of physical activity . Medications, including iron and calcium supplements  . Dairy intake . Dehydration . Abuse of laxatives  Travel  Irritable Bowel Syndrome  Pregnancy  Luteal phase of menstruation (after ovulation and before menses)  Colorectal problems  Intestinal Dysfunction  Treating Constipation  There are several ways of treating constipation, including changes to diet and exercise, use of laxatives, adjustments to the pelvic floor, and scheduled toileting.  These treatments include: . increasing fiber and fluids in the diet  . increasing physical activity . learning muscle coordination   learning proper toileting techniques  and toileting modifications   designing and sticking  to a toileting schedule     2007, [employer] Doc.22

## 2017-01-10 LAB — BASIC METABOLIC PANEL
BUN/Creatinine Ratio: 10 (ref 9–23)
BUN: 9 mg/dL (ref 6–20)
CALCIUM: 9.7 mg/dL (ref 8.7–10.2)
CO2: 23 mmol/L (ref 20–29)
CREATININE: 0.94 mg/dL (ref 0.57–1.00)
Chloride: 100 mmol/L (ref 96–106)
GFR calc Af Amer: 96 mL/min/{1.73_m2} (ref >59)
GFR, EST NON AFRICAN AMERICAN: 83 mL/min/{1.73_m2} (ref >59)
GLUCOSE: 70 mg/dL (ref 65–99)
Potassium: 4.3 mmol/L (ref 3.5–5.2)
SODIUM: 141 mmol/L (ref 134–144)

## 2017-01-10 LAB — LIPID PANEL
CHOL/HDL RATIO: 3.4 ratio (ref 0.0–4.4)
Cholesterol, Total: 174 mg/dL (ref 100–199)
HDL: 51 mg/dL (ref >39)
LDL Calculated: 109 mg/dL — ABNORMAL HIGH (ref 0–99)
TRIGLYCERIDES: 72 mg/dL (ref 0–149)
VLDL Cholesterol Cal: 14 mg/dL (ref 5–40)

## 2017-01-12 ENCOUNTER — Ambulatory Visit: Payer: Managed Care, Other (non HMO) | Admitting: Family Medicine

## 2017-02-05 ENCOUNTER — Other Ambulatory Visit: Payer: Self-pay | Admitting: Family Medicine

## 2017-02-14 ENCOUNTER — Telehealth: Payer: Self-pay | Admitting: Family Medicine

## 2017-02-14 ENCOUNTER — Other Ambulatory Visit: Payer: Self-pay | Admitting: *Deleted

## 2017-02-14 ENCOUNTER — Other Ambulatory Visit: Payer: Self-pay | Admitting: Family Medicine

## 2017-02-14 NOTE — Telephone Encounter (Signed)
Was not sure whether to approve this or not.   Says it was discontinued on 01/09/17 but the notes indicate she was changed over to this medication on 01/09/17.

## 2017-02-14 NOTE — Telephone Encounter (Signed)
Copied from [place] [id number]. Topic: Inquiry >> Feb 14, 2017  8:14 AM Pricilla Handler wrote: Reason for CRM: Patient called requesting a refill of AmLODipine (NORVASC) 5 MG tablet. Patient is completely out of the medication. Patient's preferred pharmacy is [id number] - [place], [place] [phone] (Phone)  [phone] (Fax).       Thank You!!!

## 2017-02-14 NOTE — Telephone Encounter (Signed)
Copied from [place] [id number]. Topic: Quick Communication - Rx Refill/Question >> Feb 14, 2017  3:00 PM Patrice Paradise wrote: Has the patient contacted their pharmacy? Yes.   amLODipine (NORVASC) 5 MG tablet (PATIENT HAVE BEEN OUT OF HER MED FOR 3-5 DAYS)  (Agent: If no, request that the patient contact the pharmacy for the refill.)  Preferred Pharmacy (with phone number or street name):  [id number] [address], [place] [address] Phone: [phone] Fax: [phone]   Agent: Please be advised that RX refills may take up to 3 business days. We ask that you follow-up with your pharmacy.

## 2017-02-15 NOTE — Telephone Encounter (Signed)
Called pt to get clarification on which pharmacy she is using. Let message for her to give [place] a call back.

## 2017-02-16 NOTE — Telephone Encounter (Signed)
Med refill for [facility]lodipine [facility]  [place]. Pt should contact pharm

## 2017-03-14 ENCOUNTER — Other Ambulatory Visit: Payer: Self-pay

## 2017-03-14 MED ORDER — AMLODIPINE BESYLATE 5 MG PO TABS: 5.0000 mg | ORAL_TABLET | Freq: Every day | ORAL | 0 refills | Status: DC

## 2017-03-15 NOTE — Telephone Encounter (Signed)
I called the pharmacy due to multiple faxes requesting 90 day supply for Norvasc. They said to disregard it because they have a 30 day supply from 03/14/17 that's been filled and hasn't been picked up yet

## 2017-04-19 ENCOUNTER — Other Ambulatory Visit: Payer: Self-pay | Admitting: Family Medicine

## 2017-04-19 NOTE — Telephone Encounter (Signed)
Amlodipine refill request  LOV 01/09/17     Dr. Brigitte Pulse  No refills ordered.  No future appts seen.   Has note no refills until seen at office even though LOV was 01/09/17.

## 2017-05-10 ENCOUNTER — Encounter: Payer: Self-pay | Admitting: Physician Assistant

## 2017-08-28 ENCOUNTER — Ambulatory Visit (INDEPENDENT_AMBULATORY_CARE_PROVIDER_SITE_OTHER): Payer: 59 | Admitting: Family Medicine

## 2017-08-28 ENCOUNTER — Encounter: Payer: Self-pay | Admitting: Family Medicine

## 2017-08-28 VITALS — BP 142/98 | HR 64 | Temp 98.3°F | Ht 65.0 in | Wt 399.0 lb

## 2017-08-28 DIAGNOSIS — F329 Major depressive disorder, single episode, unspecified: Secondary | ICD-10-CM | POA: Diagnosis not present

## 2017-08-28 DIAGNOSIS — R635 Abnormal weight gain: Secondary | ICD-10-CM

## 2017-08-28 DIAGNOSIS — Z7689 Persons encountering health services in other specified circumstances: Secondary | ICD-10-CM | POA: Diagnosis not present

## 2017-08-28 DIAGNOSIS — Z131 Encounter for screening for diabetes mellitus: Secondary | ICD-10-CM

## 2017-08-28 DIAGNOSIS — R5383 Other fatigue: Secondary | ICD-10-CM

## 2017-08-28 DIAGNOSIS — I10 Essential (primary) hypertension: Secondary | ICD-10-CM

## 2017-08-28 DIAGNOSIS — M545 Low back pain, unspecified: Secondary | ICD-10-CM

## 2017-08-28 DIAGNOSIS — Z862 Personal history of diseases of the blood and blood-forming organs and certain disorders involving the immune mechanism: Secondary | ICD-10-CM

## 2017-08-28 DIAGNOSIS — G8929 Other chronic pain: Secondary | ICD-10-CM

## 2017-08-28 DIAGNOSIS — F419 Anxiety disorder, unspecified: Secondary | ICD-10-CM | POA: Diagnosis not present

## 2017-08-28 DIAGNOSIS — F32A Depression, unspecified: Secondary | ICD-10-CM

## 2017-08-28 LAB — CBC WITH DIFFERENTIAL/PLATELET
Basophils Absolute: 0 10*3/uL (ref 0.0–0.1)
Basophils Relative: 0.9 % (ref 0.0–3.0)
EOS PCT: 1.4 % (ref 0.0–5.0)
Eosinophils Absolute: 0.1 10*3/uL (ref 0.0–0.7)
HCT: 33.2 % — ABNORMAL LOW (ref 36.0–46.0)
HEMOGLOBIN: 10.4 g/dL — AB (ref 12.0–15.0)
LYMPHS ABS: 1.9 10*3/uL (ref 0.7–4.0)
Lymphocytes Relative: 40.7 % (ref 12.0–46.0)
MCHC: 31.5 g/dL (ref 30.0–36.0)
MONO ABS: 0.4 10*3/uL (ref 0.1–1.0)
MONOS PCT: 8.1 % (ref 3.0–12.0)
Neutro Abs: 2.3 10*3/uL (ref 1.4–7.7)
Neutrophils Relative %: 48.9 % (ref 43.0–77.0)
Platelets: 489 10*3/uL — ABNORMAL HIGH (ref 150.0–400.0)
RBC: 4.96 Mil/uL (ref 3.87–5.11)
RDW: 18.3 % — ABNORMAL HIGH (ref 11.5–15.5)
WBC: 4.8 10*3/uL (ref 4.0–10.5)

## 2017-08-28 LAB — BASIC METABOLIC PANEL
BUN: 5 mg/dL — ABNORMAL LOW (ref 6–23)
CALCIUM: 9.3 mg/dL (ref 8.4–10.5)
CO2: 26 mEq/L (ref 19–32)
CREATININE: 0.85 mg/dL (ref 0.40–1.20)
Chloride: 100 mEq/L (ref 96–112)
GFR: 102.41 mL/min (ref >60.00)
Glucose, Bld: 80 mg/dL (ref 70–99)
Potassium: 4.2 mEq/L (ref 3.5–5.1)
SODIUM: 136 meq/L (ref 135–145)

## 2017-08-28 LAB — VITAMIN D 25 HYDROXY (VIT D DEFICIENCY, FRACTURES): VITD: 5.81 ng/mL — AB (ref 30.00–100.00)

## 2017-08-28 LAB — T4, FREE: Free T4: 0.82 ng/dL (ref 0.60–1.60)

## 2017-08-28 LAB — HEMOGLOBIN A1C: Hgb A1c MFr Bld: 5.7 % (ref 4.6–6.5)

## 2017-08-28 LAB — TSH: TSH: 2.6 u[IU]/mL (ref 0.35–4.50)

## 2017-08-28 MED ORDER — AMLODIPINE BESYLATE 5 MG PO TABS: 5.0000 mg | ORAL_TABLET | Freq: Every day | ORAL | 1 refills | Status: DC
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
## 2017-08-28 NOTE — Patient Instructions (Signed)
Back Exercises If you have pain in your back, do these exercises 2-3 times each day or as told by your doctor. When the pain goes away, do the exercises once each day, but repeat the steps more times for each exercise (do more repetitions). If you do not have pain in your back, do these exercises once each day or as told by your doctor. Exercises Single Knee to Chest  Do these steps 3-5 times in a row for each leg: 1. Lie on your back on a firm bed or the floor with your legs stretched out. 2. Bring one knee to your chest. 3. Hold your knee to your chest by grabbing your knee or thigh. 4. Pull on your knee until you feel a gentle stretch in your lower back. 5. Keep doing the stretch for 10-30 seconds. 6. Slowly let go of your leg and straighten it.  Pelvic Tilt  Do these steps 5-10 times in a row: 1. Lie on your back on a firm bed or the floor with your legs stretched out. 2. Bend your knees so they point up to the ceiling. Your feet should be flat on the floor. 3. Tighten your lower belly (abdomen) muscles to press your lower back against the floor. This will make your tailbone point up to the ceiling instead of pointing down to your feet or the floor. 4. Stay in this position for 5-10 seconds while you gently tighten your muscles and breathe evenly.  Cat-Cow  Do these steps until your lower back bends more easily: 1. Get on your hands and knees on a firm surface. Keep your hands under your shoulders, and keep your knees under your hips. You may put padding under your knees. 2. Let your head hang down, and make your tailbone point down to the floor so your lower back is round like the back of a cat. 3. Stay in this position for 5 seconds. 4. Slowly lift your head and make your tailbone point up to the ceiling so your back hangs low (sags) like the back of a cow. 5. Stay in this position for 5 seconds.  Press-Ups  Do these steps 5-10 times in a row: 1. Lie on your belly (face-down)  on the floor. 2. Place your hands near your head, about shoulder-width apart. 3. While you keep your back relaxed and keep your hips on the floor, slowly straighten your arms to raise the top half of your body and lift your shoulders. Do not use your back muscles. To make yourself more comfortable, you may change where you place your hands. 4. Stay in this position for 5 seconds. 5. Slowly return to lying flat on the floor.  Bridges  Do these steps 10 times in a row: 1. Lie on your back on a firm surface. 2. Bend your knees so they point up to the ceiling. Your feet should be flat on the floor. 3. Tighten your butt muscles and lift your butt off of the floor until your waist is almost as high as your knees. If you do not feel the muscles working in your butt and the back of your thighs, slide your feet 1-2 inches farther away from your butt. 4. Stay in this position for 3-5 seconds. 5. Slowly lower your butt to the floor, and let your butt muscles relax.  If this exercise is too easy, try doing it with your arms crossed over your chest. Belly Crunches  Do these steps 5-10 times in   a row: 1. Lie on your back on a firm bed or the floor with your legs stretched out. 2. Bend your knees so they point up to the ceiling. Your feet should be flat on the floor. 3. Cross your arms over your chest. 4. Tip your chin a little bit toward your chest but do not bend your neck. 5. Tighten your belly muscles and slowly raise your chest just enough to lift your shoulder blades a tiny bit off of the floor. 6. Slowly lower your chest and your head to the floor.  Back Lifts Do these steps 5-10 times in a row: 1. Lie on your belly (face-down) with your arms at your sides, and rest your forehead on the floor. 2. Tighten the muscles in your legs and your butt. 3. Slowly lift your chest off of the floor while you keep your hips on the floor. Keep the back of your head in line with the curve in your back. Look at  the floor while you do this. 4. Stay in this position for 3-5 seconds. 5. Slowly lower your chest and your face to the floor.  Contact a doctor if:  Your back pain gets a lot worse when you do an exercise.  Your back pain does not lessen 2 hours after you exercise. If you have any of these problems, stop doing the exercises. Do not do them again unless your doctor says it is okay. Get help right away if:  You have sudden, very bad back pain. If this happens, stop doing the exercises. Do not do them again unless your doctor says it is okay. This information is not intended to replace advice given to you by your health care provider. Make sure you discuss any questions you have with your health care provider. Document Released: 02/26/2010 Document Revised: 07/02/2015 Document Reviewed: 03/20/2014 Elsevier Interactive Patient Education  2018 [employer].  Exercising to [employer] Exercising can help you to lose weight. In order to lose weight through exercise, you need to do vigorous-intensity exercise. You can tell that you are exercising with vigorous intensity if you are breathing very hard and fast and cannot hold a conversation while exercising. Moderate-intensity exercise helps to maintain your current weight. You can tell that you are exercising at a moderate level if you have a higher heart rate and faster breathing, but you are still able to hold a conversation. How often should I exercise? Choose an activity that you enjoy and set realistic goals. Your health care provider can help you to make an activity plan that works for you. Exercise regularly as directed by your health care provider. This may include:  Doing resistance training twice each week, such as: ? Push-ups. ? Sit-ups. ? Lifting weights. ? Using resistance bands.  Doing a given intensity of exercise for a given amount of time. Choose from these options: ? 150 minutes of moderate-intensity exercise every  week. ? 75 minutes of vigorous-intensity exercise every week. ? A mix of moderate-intensity and vigorous-intensity exercise every week.  Children, pregnant women, people who are out of shape, people who are overweight, and older adults may need to consult a health care provider for individual recommendations. If you have any sort of medical condition, be sure to consult your health care provider before starting a new exercise program. What are some activities that can help me to lose weight?  Walking at a rate of at least 4.5 miles an hour.  Jogging or running at a  rate of 5 miles per hour.  Biking at a rate of at least 10 miles per hour.  Lap swimming.  Roller-skating or in-line skating.  Cross-country skiing.  Vigorous competitive sports, such as football, basketball, and soccer.  Jumping rope.  Aerobic dancing. How can I be more active in my day-to-day activities?  Use the stairs instead of the elevator.  Take a walk during your lunch break.  If you drive, park your car farther away from work or school.  If you take public transportation, get off one stop early and walk the rest of the way.  Make all of your phone calls while standing up and walking around.  Get up, stretch, and walk around every 30 minutes throughout the day. What guidelines should I follow while exercising?  Do not exercise so much that you hurt yourself, feel dizzy, or get very short of breath.  Consult your health care provider prior to starting a new exercise program.  Wear comfortable clothes and shoes with good support.  Drink plenty of water while you exercise to prevent dehydration or heat stroke. Body water is lost during exercise and must be replaced.  Work out until you breathe faster and your heart beats faster. This information is not intended to replace advice given to you by your health care provider. Make sure you discuss any questions you have with your health care  provider. Document Released: 02/26/2010 Document Revised: 07/02/2015 Document Reviewed: 06/27/2013 Elsevier Interactive Patient Education  2018 [employer].  How to Take Your Blood Pressure You can take your blood pressure at home with a machine. You may need to check your blood pressure at home:  To check if you have high blood pressure (hypertension).  To check your blood pressure over time.  To make sure your blood pressure medicine is working.  Supplies needed: You will need a blood pressure machine, or monitor. You can buy one at a drugstore or online. When choosing one:  Choose one with an arm cuff.  Choose one that wraps around your upper arm. Only one finger should fit between your arm and the cuff.  Do not choose one that measures your blood pressure from your wrist or finger.  Your doctor can suggest a monitor. How to prepare Avoid these things for 30 minutes before checking your blood pressure:  Drinking caffeine.  Drinking alcohol.  Eating.  Smoking.  Exercising.  Five minutes before checking your blood pressure:  Pee.  Sit in a dining chair. Avoid sitting in a soft couch or armchair.  Be quiet. Do not talk.  How to take your blood pressure Follow the instructions that came with your machine. If you have a digital blood pressure monitor, these may be the instructions: 1. Sit up straight. 2. Place your feet on the floor. Do not cross your ankles or legs. 3. Rest your left arm at the level of your heart. You may rest it on a table, desk, or chair. 4. Pull up your shirt sleeve. 5. Wrap the blood pressure cuff around the upper part of your left arm. The cuff should be 1 inch (2.5 cm) above your elbow. It is best to wrap the cuff around bare skin. 6. Fit the cuff snugly around your arm. You should be able to place only one finger between the cuff and your arm. 7. Put the cord inside the groove of your elbow. 8. Press the power button. 9. Sit quietly  while the cuff fills with air and  loses air. 10. Write down the numbers on the screen. 11. Wait 2-3 minutes and then repeat steps 1-10.  What do the numbers mean? Two numbers make up your blood pressure. The first number is called systolic pressure. The second is called diastolic pressure. An example of a blood pressure reading is "120 over 80" (or 120/80). If you are an adult and do not have a medical condition, use this guide to find out if your blood pressure is normal: Normal  First number: below 120.  Second number: below 80. Elevated  First number: 120-129.  Second number: below 80. Hypertension stage 1  First number: 130-139.  Second number: 80-89. Hypertension stage 2  First number: 140 or above.  Second number: 31 or above. Your blood pressure is above normal even if only the top or bottom number is above normal. Follow these instructions at home:  Check your blood pressure as often as your doctor tells you to.  Take your monitor to your next doctor's appointment. Your doctor will: ? Make sure you are using it correctly. ? Make sure it is working right.  Make sure you understand what your blood pressure numbers should be.  Tell your doctor if your medicines are causing side effects. Contact a doctor if:  Your blood pressure keeps being high. Get help right away if:  Your first blood pressure number is higher than 180.  Your second blood pressure number is higher than 120. This information is not intended to replace advice given to you by your health care provider. Make sure you discuss any questions you have with your health care provider. Document Released: 01/07/2008 Document Revised: 12/23/2015 Document Reviewed: 07/03/2015 Elsevier Interactive Patient Education  2018 [place] After being diagnosed with an anxiety disorder, you may be relieved to know why you have felt or behaved a certain way. It is natural to also feel  overwhelmed about the treatment ahead and what it will mean for your life. With care and support, you can manage this condition and recover from it. How to cope with anxiety Dealing with stress Stress is your body's reaction to life changes and events, both good and bad. Stress can last just a few hours or it can be ongoing. Stress can play a major role in anxiety, so it is important to learn both how to cope with stress and how to think about it differently. Talk with your health care provider or a counselor to learn more about stress reduction. He or she may suggest some stress reduction techniques, such as:  Music therapy. This can include creating or listening to music that you enjoy and that inspires you.  Mindfulness-based meditation. This involves being aware of your normal breaths, rather than trying to control your breathing. It can be done while sitting or walking.  Centering prayer. This is a kind of meditation that involves focusing on a word, phrase, or sacred image that is meaningful to you and that brings you peace.  Deep breathing. To do this, expand your stomach and inhale slowly through your nose. Hold your breath for 3-5 seconds. Then exhale slowly, allowing your stomach muscles to relax.  Self-talk. This is a skill where you identify thought patterns that lead to anxiety reactions and correct those thoughts.  Muscle relaxation. This involves tensing muscles then relaxing them.  Choose a stress reduction technique that fits your lifestyle and personality. Stress reduction techniques take time and practice. Set aside 5-15 minutes a day  to do them. Therapists can offer training in these techniques. The training may be covered by some insurance plans. Other things you can do to manage stress include:  Keeping a stress diary. This can help you learn what triggers your stress and ways to control your response.  Thinking about how you respond to certain situations. You may not be  able to control everything, but you can control your reaction.  Making time for activities that help you relax, and not feeling guilty about spending your time in this way.  Therapy combined with coping and stress-reduction skills provides the best chance for successful treatment. Medicines Medicines can help ease symptoms. Medicines for anxiety include:  Anti-anxiety drugs.  Antidepressants.  Beta-blockers.  Medicines may be used as the main treatment for anxiety disorder, along with therapy, or if other treatments are not working. Medicines should be prescribed by a health care provider. Relationships Relationships can play a big part in helping you recover. Try to spend more time connecting with trusted friends and family members. Consider going to couples counseling, taking family education classes, or going to family therapy. Therapy can help you and others better understand the condition. How to recognize changes in your condition Everyone has a different response to treatment for anxiety. Recovery from anxiety happens when symptoms decrease and stop interfering with your daily activities at home or work. This may mean that you will start to:  Have better concentration and focus.  Sleep better.  Be less irritable.  Have more energy.  Have improved memory.  It is important to recognize when your condition is getting worse. Contact your health care provider if your symptoms interfere with home or work and you do not feel like your condition is improving. Where to find help and support: You can get help and support from these sources:  Self-help groups.  Online and [employer].  A trusted spiritual leader.  Couples counseling.  Family education classes.  Family therapy.  Follow these instructions at home:  Eat a healthy diet that includes plenty of vegetables, fruits, whole grains, low-fat dairy products, and lean protein. Do not eat a lot of foods that  are high in solid fats, added sugars, or salt.  Exercise. Most adults should do the following: ? Exercise for at least 150 minutes each week. The exercise should increase your heart rate and make you sweat (moderate-intensity exercise). ? Strengthening exercises at least twice a week.  Cut down on caffeine, tobacco, alcohol, and other potentially harmful substances.  Get the right amount and quality of sleep. Most adults need 7-9 hours of sleep each night.  Make choices that simplify your life.  Take over-the-counter and prescription medicines only as told by your health care provider.  Avoid caffeine, alcohol, and certain over-the-counter cold medicines. These may make you feel worse. Ask your pharmacist which medicines to avoid.  Keep all follow-up visits as told by your health care provider. This is important. Questions to ask your health care provider  Would I benefit from therapy?  How often should I follow up with a health care provider?  How long do I need to take medicine?  Are there any long-term side effects of my medicine?  Are there any alternatives to taking medicine? Contact a health care provider if:  You have a hard time staying focused or finishing daily tasks.  You spend many hours a day feeling worried about everyday life.  You become exhausted by worry.  You start to have  headaches, feel tense, or have nausea.  You urinate more than normal.  You have diarrhea. Get help right away if:  You have a racing heart and shortness of breath.  You have thoughts of hurting yourself or others. If you ever feel like you may hurt yourself or others, or have thoughts about taking your own life, get help right away. You can go to your nearest emergency department or call:  Your local emergency services (911 in the [place]  A suicide crisis helpline, such as the [place] at 406-831-6036. This is open 24-hours a  day.  Summary  Taking steps to deal with stress can help calm you.  Medicines cannot cure anxiety disorders, but they can help ease symptoms.  Family, friends, and partners can play a big part in helping you recover from an anxiety disorder. This information is not intended to replace advice given to you by your health care provider. Make sure you discuss any questions you have with your health care provider. Document Released: 01/19/2016 Document Revised: 01/19/2016 Document Reviewed: 01/19/2016 Elsevier Interactive Patient Education  [employer].

## 2017-08-29 ENCOUNTER — Other Ambulatory Visit: Payer: Self-pay | Admitting: Family Medicine

## 2017-08-29 DIAGNOSIS — D509 Iron deficiency anemia, unspecified: Secondary | ICD-10-CM

## 2017-08-29 DIAGNOSIS — E559 Vitamin D deficiency, unspecified: Secondary | ICD-10-CM

## 2017-08-29 LAB — VITAMIN B12: VITAMIN B 12: 611 pg/mL (ref 211–911)

## 2017-08-29 MED ORDER — FERROUS SULFATE 325 (65 FE) MG PO TABS: 325.0000 mg | ORAL_TABLET | Freq: Every day | ORAL | 3 refills | Status: DC

## 2017-08-29 MED ORDER — VITAMIN D (ERGOCALCIFEROL) 1.25 MG (50000 UNIT) PO CAPS: 50000.0000 [IU] | ORAL_CAPSULE | ORAL | 0 refills | Status: DC

## 2017-09-28 ENCOUNTER — Ambulatory Visit: Payer: Managed Care, Other (non HMO) | Admitting: Family Medicine

## 2017-09-28 ENCOUNTER — Encounter: Payer: Self-pay | Admitting: Family Medicine

## 2017-09-28 VITALS — BP 132/80 | HR 82 | Temp 98.7°F | Wt >= 6400 oz

## 2017-09-28 DIAGNOSIS — I10 Essential (primary) hypertension: Secondary | ICD-10-CM | POA: Diagnosis not present

## 2017-09-28 DIAGNOSIS — R635 Abnormal weight gain: Secondary | ICD-10-CM | POA: Diagnosis not present

## 2017-09-28 NOTE — Progress Notes (Signed)
Subjective:    Patient ID: Shelby Chandler, female    DOB: Jul 26, 1990, 27 y.o.   MRN: [id number]  No chief complaint on file.   HPI Patient was seen today for follow-up on BP.  Since taking Norvasc 5 mg daily patient states she feels much better.  Pt no longer having headaches.  Pt is hopeful she will be able to get dental work done now that her blood pressure is better.  Pt notes weight gain.  States she wants to get control with this.  Pt to start working out with a trainer next week.  She is being more mindful of her sodium intake and drinking more water.  Past Medical History:  Diagnosis Date  . Hypertension     No Known Allergies  ROS General: Denies fever, chills, night sweats, changes in appetite   +weight gain  HEENT: Denies headaches, ear pain, changes in vision, rhinorrhea, sore throat CV: Denies CP, palpitations, SOB, orthopnea Pulm: Denies SOB, cough, wheezing GI: Denies abdominal pain, nausea, vomiting, diarrhea, constipation GU: Denies dysuria, hematuria, frequency, vaginal discharge Msk: Denies muscle cramps, joint pains Neuro: Denies weakness, numbness, tingling Skin: Denies rashes, bruising Psych: Denies depression, anxiety, hallucinations     Objective:    Blood pressure 132/80, pulse 82, temperature 98.7 F (37.1 C), temperature source Oral, weight (!) 406 lb (184.2 kg), SpO2 96 %.   Gen. Pleasant, well-nourished, in no distress, normal affect   HEENT: [place]/AT, face symmetric, no scleral icterus, PERRLA, nares patent without drainage Lungs: no accessory muscle use, CTAB, no wheezes or rales Cardiovascular: RRR, no m/r/g, no peripheral edema Neuro:  A&Ox3, CN II-XII intact, normal gait    Wt Readings from Last 3 Encounters:  09/28/17 (!) 406 lb (184.2 kg)  08/28/17 (!) 399 lb (181 kg)  01/09/17 (!) 377 lb 6.4 oz (171.2 kg)    Lab Results  Component Value Date   WBC 4.8 08/28/2017   HGB 10.4 (L) 08/28/2017   HCT 33.2 (L) 08/28/2017   PLT  489.0 (H) 08/28/2017   GLUCOSE 80 08/28/2017   CHOL 174 01/09/2017   TRIG 72 01/09/2017   HDL 51 01/09/2017   LDLCALC 109 (H) 01/09/2017   ALT 9 12/19/2016   AST 11 12/19/2016   NA 136 08/28/2017   K 4.2 08/28/2017   CL 100 08/28/2017   CREATININE 0.85 08/28/2017   BUN 5 (L) 08/28/2017   CO2 26 08/28/2017   TSH 2.60 08/28/2017   HGBA1C 5.7 08/28/2017    Assessment/Plan:  Essential hypertension -Improving -Now 132/80 was 142/98 -Lifestyle modifications encouraged including monitoring sodium intake and increasing physical activity -Continue Norvasc 5 mg daily  Weight gain -Discussed making lifestyle modifications -Patient to start working out with a trainer next week -Advised to be mindful of portions, sodium intake, calories. -Might be beneficial to eat several small meals per day or 3 meals with healthy snacks in between. -Given handout  Follow-up PRN in the next few months.  Grier Mitts, MD

## 2017-11-26 ENCOUNTER — Other Ambulatory Visit: Payer: Self-pay | Admitting: Family Medicine

## 2017-11-26 DIAGNOSIS — E559 Vitamin D deficiency, unspecified: Secondary | ICD-10-CM

## 2018-04-10 ENCOUNTER — Other Ambulatory Visit: Payer: Self-pay | Admitting: Family Medicine

## 2018-04-10 DIAGNOSIS — I10 Essential (primary) hypertension: Secondary | ICD-10-CM

## 2018-05-04 ENCOUNTER — Telehealth: Payer: Self-pay

## 2018-05-04 MED ORDER — SULFAMETHOXAZOLE-TRIMETHOPRIM 800-160 MG PO TABS: 1.0000 | ORAL_TABLET | Freq: Two times a day (BID) | ORAL | 0 refills | Status: AC

## 2018-05-04 NOTE — Telephone Encounter (Signed)
Antibiotic for Bactrim 800 mg sent to pt pharmacy, pt is aware

## 2018-05-04 NOTE — Telephone Encounter (Signed)
Resolved, nothing further needed

## 2018-06-14 ENCOUNTER — Other Ambulatory Visit: Payer: Self-pay

## 2018-06-14 ENCOUNTER — Ambulatory Visit (INDEPENDENT_AMBULATORY_CARE_PROVIDER_SITE_OTHER): Payer: Managed Care, Other (non HMO) | Admitting: Family Medicine

## 2018-06-14 ENCOUNTER — Encounter: Payer: Self-pay | Admitting: Family Medicine

## 2018-06-14 DIAGNOSIS — Z113 Encounter for screening for infections with a predominantly sexual mode of transmission: Secondary | ICD-10-CM | POA: Diagnosis not present

## 2018-06-14 DIAGNOSIS — R591 Generalized enlarged lymph nodes: Secondary | ICD-10-CM | POA: Diagnosis not present

## 2018-06-14 LAB — POCT RAPID STREP A (OFFICE): Rapid Strep A Screen: NEGATIVE

## 2018-06-14 LAB — POCT MONO (EPSTEIN BARR VIRUS): Mono, POC: NEGATIVE

## 2018-06-14 NOTE — Progress Notes (Signed)
Virtual Visit via Video Note  I connected with Shelby Chandler on 06/14/18 at  2:30 PM EDT by a video enabled telemedicine application and verified that I am speaking with the correct person using two identifiers.  Location patient: home Location provider:work or home office Persons participating in the virtual visit: patient, provider  I discussed the limitations of evaluation and management by telemedicine and the availability of in person appointments. The patient expressed understanding and agreed to proceed.   HPI: Pt requesting STD testing.  Endorses recent oral sex with a known partner several days ago.  States swallowing feels uncomfortable, "like wearing a turtle neck", and lymphadenopathy.  Pt states the back of her throat looks "lumpy, like a reptile's skin".  Pt denies d/c or lesions on partner.  Taking advil.   Denies fever, chills, cough, HAs, fatigue, vaginal irritation or d/c.  Pt notes h/o HSV.  ROS: See pertinent positives and negatives per HPI.  Past Medical History:  Diagnosis Date  . Hypertension     No past surgical history on file.  Family History  Problem Relation Age of Onset  . Alcohol abuse Mother   . Arthritis Mother   . Depression Mother   . Drug abuse Mother   . Heart attack Mother   . Hypertension Mother   . Hyperlipidemia Mother   . Alcohol abuse Father   . Diabetes Father   . Drug abuse Father   . Hypertension Father   . Heart attack Father   . Stroke Father   . Hypertension Sister   . Arthritis Maternal Grandmother   . Arthritis Maternal Grandfather   . Diabetes Paternal Grandmother     SOCIAL HX:    Current Outpatient Medications:  .  amLODipine (NORVASC) 5 MG tablet, TAKE 1 TABLET BY MOUTH EVERY DAY, Disp: 90 tablet, Rfl: 1 .  ferrous sulfate 325 (65 FE) MG tablet, Take 1 tablet (325 mg total) by mouth daily with breakfast., Disp: 90 tablet, Rfl: 3 .  polyethylene glycol powder (GLYCOLAX/MIRALAX) powder, Take 17 g by mouth 2 (two)  times daily as needed., Disp: 289 g, Rfl: 1 .  Vitamin D, Ergocalciferol, (DRISDOL) 50000 units CAPS capsule, Take 1 capsule (50,000 Units total) by mouth every 7 (seven) days., Disp: 12 capsule, Rfl: 0  EXAM:  VITALS per patient if applicable: RR between 63-14 bpm  GENERAL: alert, oriented, appears well and in no acute distress  HEENT: atraumatic, conjunctiva clear, no obvious abnormalities on inspection of external nose and ears.  Mild enlargement of R tonsil.  Unable to see clearly given lighting.  NECK: normal movements of the head and neck  LUNGS: on inspection no signs of respiratory distress, breathing rate appears normal, no obvious gross SOB, gasping or wheezing  CV: no obvious cyanosis  MS: moves all visible extremities without noticeable abnormality  PSYCH/NEURO: pleasant and cooperative, no obvious depression or anxiety, speech and thought processing grossly intact  ASSESSMENT AND PLAN:  Discussed the following assessment and plan:  Routine screening for STI (sexually transmitted infection)  -counseled on safe sex practices.   - Plan: HIV antibody (with reflex), RPR, GC/CT Probe, Amp (Throat)  Lymphadenopathy  - Plan: CBC with Differential/Platelet, TSH, POC Mono (Epstein Barr Virus), POC Rapid Strep A, CBC with Differential/Platelet    I discussed the assessment and treatment plan with the patient. The patient was provided an opportunity to ask questions and all were answered. The patient agreed with the plan and demonstrated an understanding of the  instructions.   The patient was advised to call back or seek an in-person evaluation if the symptoms worsen or if the condition fails to improve as anticipated.   Billie Ruddy, MD

## 2018-06-15 LAB — CBC WITH DIFFERENTIAL/PLATELET
Basophils Absolute: 0.1 10*3/uL (ref 0.0–0.1)
Basophils Relative: 1.2 % (ref 0.0–3.0)
Eosinophils Absolute: 0.1 10*3/uL (ref 0.0–0.7)
Eosinophils Relative: 2.8 % (ref 0.0–5.0)
HCT: 31.2 % — ABNORMAL LOW (ref 36.0–46.0)
Hemoglobin: 9.9 g/dL — ABNORMAL LOW (ref 12.0–15.0)
Lymphocytes Relative: 38.8 % (ref 12.0–46.0)
Lymphs Abs: 1.9 10*3/uL (ref 0.7–4.0)
MCHC: 31.6 g/dL (ref 30.0–36.0)
MCV: 67.5 fl — ABNORMAL LOW (ref 78.0–100.0)
Monocytes Absolute: 0.4 10*3/uL (ref 0.1–1.0)
Monocytes Relative: 8.8 % (ref 3.0–12.0)
Neutro Abs: 2.4 10*3/uL (ref 1.4–7.7)
Neutrophils Relative %: 48.4 % (ref 43.0–77.0)
Platelets: 550 10*3/uL — ABNORMAL HIGH (ref 150.0–400.0)
RBC: 4.63 Mil/uL (ref 3.87–5.11)
RDW: 18.2 % — ABNORMAL HIGH (ref 11.5–15.5)
WBC: 4.9 10*3/uL (ref 4.0–10.5)

## 2018-06-15 LAB — TSH: TSH: 3.6 u[IU]/mL (ref 0.35–4.50)

## 2018-06-16 LAB — GC/CHLAMYDIA PROBE, AMP (THROAT)
Chlamydia trachomatis RNA: NOT DETECTED
Neisseria gonorrhoeae RNA: NOT DETECTED

## 2018-06-16 LAB — HIV ANTIBODY (ROUTINE TESTING W REFLEX): HIV 1&2 Ab, 4th Generation: NONREACTIVE

## 2018-06-16 LAB — RPR: RPR Ser Ql: NONREACTIVE

## 2018-06-19 ENCOUNTER — Telehealth: Payer: Self-pay | Admitting: Family Medicine

## 2018-06-19 NOTE — Telephone Encounter (Signed)
Result note read to patient. Pt is questioning if there is a possibility of B12 injections instead of po supplements. States it is hard for her to remember to take supplements and get to pharmacy. Also requesting Iron injections if possible. States may respond via [place].  Telephone Encounter as result note was not routed to [facility].

## 2018-06-20 NOTE — Telephone Encounter (Signed)
Please Advise

## 2018-06-20 NOTE — Telephone Encounter (Signed)
Pt can set an alarm in her phone and use a pill organizer to help her to remember to take the supplements.  She can also try taking them in the evening if that is easier.

## 2018-06-25 NOTE — Telephone Encounter (Signed)
Replied to pt through MyChart portal.

## 2018-06-25 NOTE — Telephone Encounter (Signed)
Pt calling back to check status. Would like a call back from the nurse. Please advise   [phone]

## 2019-04-02 ENCOUNTER — Ambulatory Visit: Payer: Managed Care, Other (non HMO)

## 2019-04-03 ENCOUNTER — Ambulatory Visit: Payer: Managed Care, Other (non HMO) | Attending: Internal Medicine

## 2019-04-03 DIAGNOSIS — Z20822 Contact with and (suspected) exposure to covid-19: Secondary | ICD-10-CM

## 2019-04-04 LAB — NOVEL CORONAVIRUS, NAA: SARS-CoV-2, NAA: NOT DETECTED

## 2019-04-06 ENCOUNTER — Ambulatory Visit: Payer: Managed Care, Other (non HMO) | Attending: Internal Medicine

## 2019-04-06 DIAGNOSIS — Z23 Encounter for immunization: Secondary | ICD-10-CM

## 2019-04-06 NOTE — Progress Notes (Signed)
   Covid-19 Vaccination Clinic  Name:  Shelby Chandler    MRN: [id number] DOB: 05-Feb-1991  04/06/2019  Shelby Chandler was observed post Covid-19 immunization for 15 minutes without incidence. She was provided with Vaccine Information Sheet and instruction to access the V-Safe system.   Shelby Chandler was instructed to call 911 with any severe reactions post vaccine: Marland Kitchen Difficulty breathing  . Swelling of your face and throat  . A fast heartbeat  . A bad rash all over your body  . Dizziness and weakness    Immunizations Administered    Name Date Dose VIS Date Route   Pfizer COVID-19 Vaccine 04/06/2019 12:00 PM 0.3 mL 01/18/2019 Intramuscular   Manufacturer: [place]   Lot: [id number]   [place]: [id number]

## 2019-04-27 ENCOUNTER — Ambulatory Visit: Payer: Managed Care, Other (non HMO) | Attending: Internal Medicine

## 2019-04-27 DIAGNOSIS — Z23 Encounter for immunization: Secondary | ICD-10-CM

## 2019-04-27 NOTE — Progress Notes (Signed)
   Covid-19 Vaccination Clinic  Name:  Shelby Chandler    MRN: [id number] DOB: 07-29-1990  04/27/2019  [name] was observed post Covid-19 immunization for 15 minutes without incident. She was provided with Vaccine Information Sheet and instruction to access the V-Safe system.   [name] was instructed to call 911 with any severe reactions post vaccine: Marland Kitchen Difficulty breathing  . Swelling of face and throat  . A fast heartbeat  . A bad rash all over body  . Dizziness and weakness   Immunizations Administered    Name Date Dose VIS Date Route   Pfizer COVID-19 Vaccine 04/27/2019 10:50 AM 0.3 mL 01/18/2019 Intramuscular   Manufacturer: [place]   Lot: [id number]   [place]: [id number]

## 2019-05-01 ENCOUNTER — Ambulatory Visit: Payer: Managed Care, Other (non HMO)

## 2019-05-15 ENCOUNTER — Encounter: Payer: Self-pay | Admitting: Family Medicine

## 2019-05-15 ENCOUNTER — Telehealth (INDEPENDENT_AMBULATORY_CARE_PROVIDER_SITE_OTHER): Payer: Managed Care, Other (non HMO) | Admitting: Family Medicine

## 2019-05-15 DIAGNOSIS — B009 Herpesviral infection, unspecified: Secondary | ICD-10-CM

## 2019-05-15 DIAGNOSIS — Z3009 Encounter for other general counseling and advice on contraception: Secondary | ICD-10-CM

## 2019-05-15 DIAGNOSIS — B372 Candidiasis of skin and nail: Secondary | ICD-10-CM

## 2019-05-15 MED ORDER — FLUCONAZOLE 150 MG PO TABS: ORAL_TABLET | ORAL | 0 refills | Status: DC

## 2019-05-15 MED ORDER — NYSTATIN 100000 UNIT/GM EX CREA: 1.0000 "application " | TOPICAL_CREAM | Freq: Two times a day (BID) | CUTANEOUS | 2 refills | Status: DC

## 2019-05-15 NOTE — Patient Instructions (Signed)
Skin Yeast Infection  A skin yeast infection is a condition in which there is an overgrowth of yeast (candida) that normally lives on the skin. This condition usually occurs in areas of the skin that are constantly warm and moist, such as the armpits or the groin. What are the causes? This condition is caused by a change in the normal balance of the yeast and bacteria that live on the skin. What increases the risk? You are more likely to develop this condition if you:  Are obese.  Are pregnant.  Take birth control pills.  Have diabetes.  Take antibiotic medicines.  Take steroid medicines.  Are malnourished.  Have a weak body defense system (immune system).  Are 30 years of age or older.  Wear tight clothing. What are the signs or symptoms? The most common symptom of this condition is itchiness in the affected area. Other symptoms include:  Red, swollen area of the skin.  Bumps on the skin. How is this diagnosed?  This condition is diagnosed with a medical history and physical exam.  Your health care provider may check for yeast by taking light scrapings of the skin to be viewed under a microscope. How is this treated? This condition is treated with medicine. Medicines may be prescribed or be available over the counter. The medicines may be:  Taken by mouth (orally).  Applied as a cream or powder to your skin. Follow these instructions at home:   Take or apply over-the-counter and prescription medicines only as told by your health care provider.  Maintain a healthy weight. If you need help losing weight, talk with your health care provider.  Keep your skin clean and dry.  If you have diabetes, keep your blood sugar under control.  Keep all follow-up visits as told by your health care provider. This is important. Contact a health care provider if:  Your symptoms go away and then return.  Your symptoms do not get better with treatment.  Your symptoms get  worse.  Your rash spreads.  You have a fever or chills.  You have new symptoms.  You have new warmth or redness of your skin. Summary  A skin yeast infection is a condition in which there is an overgrowth of yeast (candida) that normally lives on the skin. This condition is caused by a change in the normal balance of the yeast and bacteria that live on the skin.  Take or apply over-the-counter and prescription medicines only as told by your health care provider.  Keep your skin clean and dry.  Contact a health care provider if your symptoms do not get better with treatment. This information is not intended to replace advice given to you by your health care provider. Make sure you discuss any questions you have with your health care provider. Document Revised: 06/13/2017 Document Reviewed: 06/13/2017 Elsevier Patient Education  [place] is skin irritation or inflammation (dermatitis) that occurs when folds of skin rub together. The irritation can cause a rash and make skin raw and itchy. This condition most commonly occurs in the skin folds of these areas:  Toes.  Armpits.  Groin.  Under the belly.  Under the breasts.  Buttocks. Intertrigo is not passed from person to person (is not contagious). What are the causes? This condition is caused by heat, moisture, rubbing (friction), and not enough air circulation. The condition can be made worse by:  Sweat.  Bacteria.  A fungus, such as yeast. What  increases the risk? This condition is more likely to occur if you have moisture in your skin folds. You are more likely to develop this condition if you:  Have diabetes.  Are overweight.  Are not able to move around or are not active.  Live in a warm and moist climate.  Wear splints, braces, or other medical devices.  Are not able to control your bowels or bladder (have incontinence). What are the signs or symptoms? Symptoms of this  condition include:  A pink or red skin rash in the skin fold or near the skin fold.  Raw or scaly skin.  Itchiness.  A burning feeling.  Bleeding.  Leaking fluid.  A bad smell. How is this diagnosed? This condition is diagnosed with a medical history and physical exam. You may also have a skin swab to test for bacteria or a fungus. How is this treated? This condition may be treated by:  Cleaning and drying your skin.  Taking an antibiotic medicine or using an antibiotic skin cream for a bacterial infection.  Using an antifungal cream on your skin or taking pills for an infection that was caused by a fungus, such as yeast.  Using a steroid ointment to relieve itchiness and irritation.  Separating the skin fold with a clean cotton cloth to absorb moisture and allow air to flow into the area. Follow these instructions at home:  Keep the affected area clean and dry.  Do not scratch your skin.  Stay in a cool environment as much as possible. Use an air conditioner or fan, if available.  Apply over-the-counter and prescription medicines only as told by your health care provider.  If you were prescribed an antibiotic medicine, use it as told by your health care provider. Do not stop using the antibiotic even if your condition improves.  Keep all follow-up visits as told by your health care provider. This is important. How is this prevented?   Maintain a healthy weight.  Take care of your feet, especially if you have diabetes. Foot care includes: ? Wearing shoes that fit well. ? Keeping your feet dry. ? Wearing clean, breathable socks.  Protect the skin around your groin and buttocks, especially if you have incontinence. Skin protection includes: ? Following a regular cleaning routine. ? Using skin protectant creams, powders, or ointments. ? Changing protection pads frequently.  Do not wear tight clothes. Wear clothes that are loose, absorbent, and made of  cotton.  Wear a bra that gives good support, if needed.  Shower and dry yourself well after activity or exercise. Use a hair dryer on a cool setting to dry between skin folds, especially after you bathe.  If you have diabetes, keep your blood sugar under control. Contact a health care provider if:  Your symptoms do not improve with treatment.  Your symptoms get worse or they spread.  You notice increased redness and warmth.  You have a fever. Summary  Intertrigo is skin irritation or inflammation (dermatitis) that occurs when folds of skin rub together.  This condition is caused by heat, moisture, rubbing (friction), and not enough air circulation.  This condition may be treated by cleaning and drying your skin and with medicines.  Apply over-the-counter and prescription medicines only as told by your health care provider.  Keep all follow-up visits as told by your health care provider. This is important. This information is not intended to replace advice given to you by your health care provider. Make sure you discuss  any questions you have with your health care provider. Document Revised: 06/26/2017 Document Reviewed: 06/26/2017 Elsevier Patient Education  [place].

## 2019-05-15 NOTE — Progress Notes (Signed)
Virtual Visit via Video Note  I connected with Shelby Chandler on 05/15/19 at  4:30 PM EDT by a video enabled telemedicine application 2/2 [id number] pandemic and verified that I am speaking with the correct person using two identifiers.  Location patient: home Location provider:work or home office Persons participating in the virtual visit: patient, provider  I discussed the limitations of evaluation and management by telemedicine and the availability of in person appointments. The patient expressed understanding and agreed to proceed.   HPI: Pt with pruritic rash underneath b/l breast.  Started off as small dots that turned into a dark area.  Rash appeared last wk when the weather got warmer.  Pt inquires about h/o HSV2 and pregnancy.  Last outbreak was several yrs ago.    ROS: See pertinent positives and negatives per HPI.  Past Medical History:  Diagnosis Date  . Hypertension     History reviewed. No pertinent surgical history.  Family History  Problem Relation Age of Onset  . Alcohol abuse Mother   . Arthritis Mother   . Depression Mother   . Drug abuse Mother   . Heart attack Mother   . Hypertension Mother   . Hyperlipidemia Mother   . Alcohol abuse Father   . Diabetes Father   . Drug abuse Father   . Hypertension Father   . Heart attack Father   . Stroke Father   . Hypertension Sister   . Arthritis Maternal Grandmother   . Arthritis Maternal Grandfather   . 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  ferrous sulfate 325 (65 FE) MG tablet, Take 1 tablet (325 mg total) by mouth daily with breakfast., Disp: 90 tablet, Rfl: 3 .  amLODipine (NORVASC) 5 MG tablet, TAKE 1 TABLET BY MOUTH EVERY DAY (Patient not taking: Reported on 05/15/2019), Disp: 90 tablet, Rfl: 1  EXAM:  VITALS per patient if applicable:  RR between 12-20 bpm  GENERAL: alert, oriented,  appears well and in no acute distress  HEENT: atraumatic, conjunctiva clear, no obvious abnormalities on inspection of external nose and ears  NECK: normal movements of the head and neck  LUNGS: on inspection no signs of respiratory distress, breathing rate appears normal, no obvious gross SOB, gasping or wheezing  CV: no obvious cyanosis  SKIN:.  A plaque of hyperpigmentation underneath and between bilateral breast with small satellite lesions on outer edge of plaque.  MS: moves all visible extremities without noticeable abnormality  PSYCH/NEURO: pleasant and cooperative, no obvious depression or anxiety, speech and thought processing grossly intact  ASSESSMENT AND PLAN:  Discussed the following assessment and plan:  Candidal dermatitis -Satellite lesions noted on exam -Discussed causes of lesions including increased moisture trapped against skin - Plan: fluconazole (DIFLUCAN) 150 MG tablet, nystatin cream (MYCOSTATIN)  Family-planning counseling -h/o HSV is not a limitation to becoming pregnant. 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 -Continue to monitor  F/u prn   I discussed the assessment and treatment plan with the patient. The patient was provided an opportunity to ask questions and all were answered. The patient agreed with the plan and demonstrated an understanding of the instructions.   The patient was advised to call back or seek an in-person evaluation if the symptoms  worsen or if the condition fails to improve as anticipated.   Billie Ruddy, MD

## 2019-07-01 ENCOUNTER — Ambulatory Visit: Payer: Managed Care, Other (non HMO) | Admitting: Family Medicine

## 2019-07-01 ENCOUNTER — Encounter: Payer: Self-pay | Admitting: Family Medicine

## 2019-07-01 ENCOUNTER — Other Ambulatory Visit: Payer: Self-pay

## 2019-07-01 ENCOUNTER — Ambulatory Visit (INDEPENDENT_AMBULATORY_CARE_PROVIDER_SITE_OTHER): Payer: Managed Care, Other (non HMO) | Admitting: Family Medicine

## 2019-07-01 VITALS — BP 132/84 | HR 84 | Temp 97.9°F | Wt >= 6400 oz

## 2019-07-01 DIAGNOSIS — R635 Abnormal weight gain: Secondary | ICD-10-CM

## 2019-07-01 DIAGNOSIS — D649 Anemia, unspecified: Secondary | ICD-10-CM

## 2019-07-01 DIAGNOSIS — M545 Low back pain, unspecified: Secondary | ICD-10-CM

## 2019-07-01 DIAGNOSIS — L680 Hirsutism: Secondary | ICD-10-CM | POA: Diagnosis not present

## 2019-07-01 DIAGNOSIS — L659 Nonscarring hair loss, unspecified: Secondary | ICD-10-CM

## 2019-07-01 DIAGNOSIS — I10 Essential (primary) hypertension: Secondary | ICD-10-CM

## 2019-07-01 DIAGNOSIS — G8929 Other chronic pain: Secondary | ICD-10-CM

## 2019-07-01 NOTE — Progress Notes (Signed)
Subjective:    Patient ID: Shelby Chandler, female    DOB: 05/24/1990, 29 y.o.   MRN: [id number]  No chief complaint on file.   HPI Pt is a 29 yo female with pmh sig for HTN, iron deficiency anemia, obesity, anxiety, depression, HSV-2, low back pain who was seen today for ongoing concerns.  Pt interested in weight loss options.  Endorses 40 pound wt gain.  Pt's weighed 400+ lbs in 2017, however lost a significant amount by becoming a vegetarian.  Pt doing intermittent fasting, but gaining wt.  Pt feels "not myself".  Endorses decreased energy, decreased focus, hair loss, hirsutism, normal menses.  Pt notes mom has history of RA and lupus.  Pt interested in lupus testing.  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  Past Medical History:  Diagnosis Date  . Hypertension     No Known Allergies  ROS General: Denies fever, chills, night sweats, changes in appetite +wt. gain, hair loss-coming out in clumps, decreased energy, decreased focus HEENT: Denies headaches, ear pain, changes in vision, rhinorrhea, sore throat CV: Denies CP, palpitations, SOB, orthopnea Pulm: Denies SOB, cough, wheezing GI: Denies abdominal pain, nausea, vomiting, diarrhea, constipation GU: Denies dysuria, hematuria, frequency, vaginal discharge Msk: Denies muscle cramps, joint pains Neuro: Denies weakness, numbness, tingling Skin: Denies rashes, bruising Psych: Denies depression, anxiety, hallucinations     Objective:    Blood pressure 132/84, pulse 84, temperature 97.9 F (36.6 C), temperature source Temporal, weight (!) 435 lb (197.3 kg), SpO2 98 %.   Gen. Pleasant, well-nourished, obese, in no distress, normal affect   HEENT: [place]/AT, face symmetric, no scleral icterus, PERRLA, EOMI, nares patent without drainage Lungs: no accessory muscle use, CTAB, no wheezes or rales Cardiovascular: RRR, no m/r/g, no peripheral edema Abdomen: BS present, soft,  NT/ND Musculoskeletal: No deformities, no cyanosis or clubbing, normal tone Neuro:  A&Ox3, CN II-XII intact, normal gait Skin:  Warm, no lesions/ rash, hirsutism  Wt Readings from Last 3 Encounters:  07/01/19 (!) 435 lb (197.3 kg)  09/28/17 (!) 406 lb (184.2 kg)  08/28/17 (!) 399 lb (181 kg)    Lab Results  Component Value Date   WBC 4.9 06/14/2018   HGB 9.9 (L) 06/14/2018   HCT 31.2 (L) 06/14/2018   PLT 550.0 (H) 06/14/2018   GLUCOSE 80 08/28/2017   CHOL 174 01/09/2017   TRIG 72 01/09/2017   HDL 51 01/09/2017   LDLCALC 109 (H) 01/09/2017   ALT 9 12/19/2016   AST 11 12/19/2016   NA 136 08/28/2017   K 4.2 08/28/2017   CL 100 08/28/2017   CREATININE 0.85 08/28/2017   BUN 5 (L) 08/28/2017   CO2 26 08/28/2017   TSH 3.60 06/14/2018   HGBA1C 5.7 08/28/2017    Assessment/Plan:  Weight gain  -discussed possible causes including thyroid dysfunction, other metabolic conditions,   - Plan: TSH, T4, Free  Chronic bilateral low back pain without sciatica  - Plan: CBC with Differential/Platelet, Comprehensive metabolic panel, Rheumatoid Factor  Hair loss  -discussed possible causes including thyroid dysfunction, autoimmune disorder, alopecia - Plan: TSH, T4, Free, Lupus (SLE) Analysis, Sedimentation Rate, C-reactive Protein, C3 and C4, Rheumatoid Factor  Hirsutism  -discussed causes including PCOS - Plan: Hemoglobin A1c  Anemia, unspecified type  -may be contributing to fatigue -continue po iron -will obtain labs including B12 as pt is a vegetarian - Plan: CBC with Differential/Platelet, Vitamin B12, Folate, Iron and TIBC,  Ferritin  Morbid obesity (HCC) -BMI 70.24  - Plan: Ambulatory referral to diabetic education, Ambulatory referral to General Surgery  Essential hypertension -Elevated -Lifestyle modifications encouraged -Consider restarting Norvasc 5 mg  F/u prn  Grier Mitts, MD

## 2019-07-01 NOTE — Patient Instructions (Signed)
Bariatric Surgery Information Bariatric surgery, also called weight loss surgery, is a procedure that helps you lose weight. You may consider, or your health care provider may suggest, bariatric surgery if:  You are severely obese and have been unable to lose weight through diet and exercise.  You have health problems related to obesity, such as: ? Type 2 diabetes. ? Heart disease. ? Lung disease. How does bariatric surgery help me lose weight? Bariatric surgery helps you lose weight by:  Decreasing how much food your body absorbs. This is done by closing off part of your stomach to make it smaller. This restricts the amount of food your stomach can hold.  Changing your body's regular digestive process so that food bypasses the parts of your body that absorb calories and nutrients. If you decide to have bariatric surgery, it is important to continue to eat a healthy diet and exercise regularly after the surgery. What are the different kinds of bariatric surgery? There are two kinds of bariatric surgeries:  Restrictive surgery. This procedure makes your stomach smaller. It does not change your digestive process. The smaller the size of your new stomach, the less food you can eat. There are different types of restrictive surgeries.  Malabsorptive surgery. This procedure makes your stomach smaller and alters your digestive process so that your body processes less calories and nutrients. These are the most common kind of bariatric surgery. There are different types of malabsorptive surgeries. What are the different types of restrictive surgery? Adjustable Gastric Banding In this procedure, an inflatable band is placed around your stomach near the upper end. This makes the passageway for food into the rest of your stomach much smaller. The band can be adjusted, making it tighter or looser, by filling it with salt solution. Your surgeon can adjust the band based on how you are feeling and how much  weight you are losing. The band can be removed in the future. This requires another surgery. Sleeve Gastrectomy In this procedure, your stomach is made smaller. This is done by surgically removing a large part of your stomach. When your stomach is smaller, you feel full more quickly and reduce how much you eat. What are the different types of malabsorptive surgery?  Roux-en-Y Gastric Bypass (RGB) This is the most common weight loss surgery. In this procedure, a small stomach pouch (gastric pouch) is created in the upper part of your stomach. Next, this gastric pouch is attached directly to the middle part of your small intestine. The farther down your small intestine the new connection is made, the fewer calories and nutrients you will absorb. This surgery has the highest rate of complications. Biliopancreatic Diversion with Duodenal Switch (BPD/DS) This is a multi-step procedure. First, a large part of your stomach is removed, making your stomach smaller. Next, this smaller stomach is attached to the lower part of your small intestine. Like the RGB surgery, you absorb fewer calories and nutrients the farther down your small intestine the attachment is made. What are the risks of bariatric surgery? As with any surgical procedure, each type of bariatric surgery has its own risks. These risks also depend on your age, your overall health, and any other medical conditions you may have. When deciding on bariatric surgery, it is very important to:  Talk to your health care provider and choose the surgery that is best for you.  Ask your health care provider about specific risks for the surgery you choose. Generally, the risks of bariatric surgery include:  Infection.  Bleeding.  Not getting enough nutrients from food (nutritional deficiencies).  Failure of the device or procedure. This may require another surgery to correct the problem. Where to find more information  American Society for  Metabolic & Bariatric Surgery: www.asmbs.org  Weight-control Information Network (WIN): win.AmenCredit.is Summary  Bariatric surgery, also called weight loss surgery, is a procedure that helps you lose weight.  This surgery may be recommended if you have diabetes, heart disease, or lung disease.  Generally, risks of bariatric surgery include infection, bleeding, and failure of the surgery or device, which may require another surgery to correct the problem. This information is not intended to replace advice given to you by your health care provider. Make sure you discuss any questions you have with your health care provider. Document Revised: 05/15/2018 Document Reviewed: 02/29/2016 Elsevier Patient Education  [place].  Chronic Back Pain When back pain lasts longer than 3 months, it is called chronic back pain.The cause of your back pain may not be known. Some common causes include:  Wear and tear (degenerative disease) of the bones, ligaments, or disks in your back.  Inflammation and stiffness in your back (arthritis). People who have chronic back pain often go through certain periods in which the pain is more intense (flare-ups). Many people can learn to manage the pain with home care. Follow these instructions at home: Pay attention to any changes in your symptoms. Take these actions to help with your pain: Activity   Avoid bending and other activities that make the problem worse.  Maintain a proper position when standing or sitting: ? When standing, keep your upper back and neck straight, with your shoulders pulled back. Avoid slouching. ? When sitting, keep your back straight and relax your shoulders. Do not round your shoulders or pull them backward.  Do not sit or stand in one place for long periods of time.  Take brief periods of rest throughout the day. This will reduce your pain. Resting in a lying or standing position is usually better than sitting to  rest.  When you are resting for longer periods, mix in some mild activity or stretching between periods of rest. This will help to prevent stiffness and pain.  Get regular exercise. Ask your health care provider what activities are safe for you.  Do not lift anything that is heavier than 10 lb (4.5 kg). Always use proper lifting technique, which includes: ? Bending your knees. ? Keeping the load close to your body. ? Avoiding twisting.  Sleep on a firm mattress in a comfortable position. Try lying on your side with your knees slightly bent. If you lie on your back, put a pillow under your knees. Managing pain  If directed, apply ice to the painful area. Your health care provider may recommend applying ice during the first 24-48 hours after a flare-up begins. ? Put ice in a plastic bag. ? Place a towel between your skin and the bag. ? Leave the ice on for 20 minutes, 2-3 times per day.  If directed, apply heat to the affected area as often as told by your health care provider. Use the heat source that your health care provider recommends, such as a moist heat pack or a heating pad. ? Place a towel between your skin and the heat source. ? Leave the heat on for 20-30 minutes. ? Remove the heat if your skin turns bright red. This is especially important if you are unable to feel pain, heat,  or cold. You may have a greater risk of getting burned.  Try soaking in a warm tub.  Take over-the-counter and prescription medicines only as told by your health care provider.  Keep all follow-up visits as told by your health care provider. This is important. Contact a health care provider if:  You have pain that is not relieved with rest or medicine. Get help right away if:  You have weakness or numbness in one or both of your legs or feet.  You have trouble controlling your bladder or your bowels.  You have nausea or vomiting.  You have pain in your abdomen.  You have shortness of breath  or you faint. This information is not intended to replace advice given to you by your health care provider. Make sure you discuss any questions you have with your health care provider. Document Revised: 05/17/2018 Document Reviewed: 08/03/2016 Elsevier Patient Education  [place].  Hirsutism and Masculinization Hirsutism is when a female has an excessive amount of hair in an area where a female would typically have hair growth, such as on the face, chest, or back. Masculinization is when a female's body develops certain characteristics that are like a female's body. What are the causes? Although many things can cause these conditions, the most common cause is polycystic ovary syndrome (PCOS). Taking certain medicines, such as androgens and anabolic steroids, may also cause these conditions. What are the signs or symptoms? Hirsutism Symptoms of this condition include excess hair growth on the:  Face.  Chest.  Thighs.  Back. Masculinization Symptoms of this condition include:  Deepening voice.  Loss of breast tissue.  Increased muscle mass.  Thinning hair on the head (balding).  Irregular or loss of menstrual periods.  Enlarged clitoris. How is this diagnosed? This condition is diagnosed based on a health history, physical exam, and tests. Tests may include blood tests and imaging studies. Your health care provider may recommend that you follow up with specialists to understand the cause of your condition or to help treat your condition. How is this treated? This condition may be treated by:  Taking medicines to help control hair growth.  Making lifestyle changes to help reduce hormone levels that are contributing to your condition.  Removing unwanted hair by shaving, using creams, or waxing. More permanent ways to remove hair include electrolysis and laser treatments. If your symptoms are caused by certain medicines, your health care provider may have you stop  taking them. Follow these instructions at home:  Take over-the-counter and prescription medicines only as told by your health care provider.  Talk to your health care provider about your treatment options and what may be best for you.  Maintain a healthy weight. If needed, talk to your health care provider about how to lose weight.  Keep all follow-up visits as told by your health care provider. This is important. Contact a health care provider if:  Your symptoms suddenly get worse.  You develop acne.  You have irregular menstrual periods.  You stop having your period.  You feel a lump in your lower abdomen. Summary  Hirsutism is when a female has an excessive amount of hair in an area where a female would typically have hair growth, such as on the face, chest, thighs, or back.  Masculinization is when a female's body develops characteristics like a female's body. This may include a deepening voice, loss of breast tissue, increased muscle mass, thinning hair (balding), changes in menstrual periods, and  clitoris growth.  The most common cause of hirsutism is polycystic ovary syndrome (PCOS).  Treatment options include removing unwanted hair, taking medicines, and making lifestyle changes. Talk to your health care provider about which treatments are best for you.  Your health care provider may refer to you to specialists to find the cause of your condition. This information is not intended to replace advice given to you by your health care provider. Make sure you discuss any questions you have with your health care provider. Document Revised: 12/05/2017 Document Reviewed: 12/05/2017 Elsevier Patient Education  [place].  Polycystic Ovarian Syndrome  Polycystic ovarian syndrome (PCOS) is a common hormonal disorder among women of reproductive age. In most women with PCOS, many small fluid-filled sacs (cysts) grow on the ovaries, and the cysts are not part of a normal menstrual  cycle. PCOS can cause problems with your menstrual periods and make it difficult to get pregnant. It can also cause an increased risk of miscarriage with pregnancy. If it is not treated, PCOS can lead to serious health problems, such as diabetes and heart disease. What are the causes? The cause of PCOS is not known, but it may be the result of a combination of certain factors, such as:  Irregular menstrual cycle.  High levels of certain hormones (androgens).  Problems with the hormone that helps to control blood sugar (insulin resistance).  Certain genes. What increases the risk? This condition is more likely to develop in women who have a family history of PCOS. What are the signs or symptoms? Symptoms of PCOS may include:  Multiple ovarian cysts.  Infrequent periods or no periods.  Periods that are too frequent or too heavy.  Unpredictable periods.  Inability to get pregnant (infertility) because of not ovulating.  Increased growth of hair on the face, chest, stomach, back, thumbs, thighs, or toes.  Acne or oily skin. Acne may develop during adulthood, and it may not respond to treatment.  Pelvic pain.  Weight gain or obesity.  Patches of thickened and dark brown or black skin on the neck, arms, breasts, or thighs (acanthosis nigricans).  Excess hair growth on the face, chest, abdomen, or upper thighs (hirsutism). How is this diagnosed? This condition is diagnosed based on:  Your medical history.  A physical exam, including a pelvic exam. Your health care provider may look for areas of increased hair growth on your skin.  Tests, such as: ? Ultrasound. This may be used to examine the ovaries and the lining of the uterus (endometrium) for cysts. ? Blood tests. These may be used to check levels of sugar (glucose), female hormone (testosterone), and female hormones (estrogen and progesterone) in your blood. How is this treated? There is no cure for PCOS, but treatment can  help to manage symptoms and prevent more health problems from developing. Treatment varies depending on:  Your symptoms.  Whether you want to have a baby or whether you need birth control (contraception). Treatment may include nutrition and lifestyle changes along with:  Progesterone hormone to start a menstrual period.  Birth control pills to help you have regular menstrual periods.  Medicines to make you ovulate, if you want to get pregnant.  Medicine to reduce excessive hair growth.  Surgery, in severe cases. This may involve making small holes in one or both of your ovaries. This decreases the amount of testosterone that your body produces. Follow these instructions at home:  Take over-the-counter and prescription medicines only as told by your health  care provider.  Follow a healthy meal plan. This can help you reduce the effects of PCOS. ? Eat a healthy diet that includes lean proteins, complex carbohydrates, fresh fruits and vegetables, low-fat dairy products, and healthy fats. Make sure to eat enough fiber.  If you are overweight, lose weight as told by your health care provider. ? Losing 10% of your body weight may improve symptoms. ? Your health care provider can determine how much weight loss is best for you and can help you lose weight safely.  Keep all follow-up visits as told by your health care provider. This is important. Contact a health care provider if:  Your symptoms do not get better with medicine.  You develop new symptoms. This information is not intended to replace advice given to you by your health care provider. Make sure you discuss any questions you have with your health care provider. Document Revised: 01/06/2017 Document Reviewed: 07/12/2015 Elsevier Patient Education  2020 [employer].

## 2019-07-02 LAB — CBC WITH DIFFERENTIAL/PLATELET
Basophils Absolute: 0.1 10*3/uL (ref 0.0–0.1)
Basophils Relative: 0.7 % (ref 0.0–3.0)
Eosinophils Absolute: 0.1 10*3/uL (ref 0.0–0.7)
Eosinophils Relative: 1.6 % (ref 0.0–5.0)
HCT: 32.5 % — ABNORMAL LOW (ref 36.0–46.0)
Hemoglobin: 9.9 g/dL — ABNORMAL LOW (ref 12.0–15.0)
Lymphocytes Relative: 33.2 % (ref 12.0–46.0)
Lymphs Abs: 2.3 10*3/uL (ref 0.7–4.0)
MCHC: 30.5 g/dL (ref 30.0–36.0)
MCV: 70 fl — ABNORMAL LOW (ref 78.0–100.0)
Monocytes Absolute: 0.6 10*3/uL (ref 0.1–1.0)
Monocytes Relative: 8.4 % (ref 3.0–12.0)
Neutro Abs: 3.9 10*3/uL (ref 1.4–7.7)
Neutrophils Relative %: 56.1 % (ref 43.0–77.0)
Platelets: 441 10*3/uL — ABNORMAL HIGH (ref 150.0–400.0)
RBC: 4.64 Mil/uL (ref 3.87–5.11)
RDW: 17.3 % — ABNORMAL HIGH (ref 11.5–15.5)
WBC: 7 10*3/uL (ref 4.0–10.5)

## 2019-07-02 LAB — COMPREHENSIVE METABOLIC PANEL
ALT: 12 U/L (ref 0–35)
AST: 11 U/L (ref 0–37)
Albumin: 4 g/dL (ref 3.5–5.2)
Alkaline Phosphatase: 53 U/L (ref 39–117)
BUN: 8 mg/dL (ref 6–23)
CO2: 25 mEq/L (ref 19–32)
Calcium: 9.1 mg/dL (ref 8.4–10.5)
Chloride: 104 mEq/L (ref 96–112)
Creatinine, Ser: 0.77 mg/dL (ref 0.40–1.20)
GFR: 106.61 mL/min (ref >60.00)
Glucose, Bld: 81 mg/dL (ref 70–99)
Potassium: 4.2 mEq/L (ref 3.5–5.1)
Sodium: 138 mEq/L (ref 135–145)
Total Bilirubin: 0.2 mg/dL (ref 0.2–1.2)
Total Protein: 7.2 g/dL (ref 6.0–8.3)

## 2019-07-02 LAB — T4, FREE: Free T4: 0.74 ng/dL (ref 0.60–1.60)

## 2019-07-02 LAB — VITAMIN B12: Vitamin B-12: 209 pg/mL — ABNORMAL LOW (ref 211–911)

## 2019-07-02 LAB — SEDIMENTATION RATE: Sed Rate: 66 mm/hr — ABNORMAL HIGH (ref 0–20)

## 2019-07-02 LAB — FERRITIN: Ferritin: 6.4 ng/mL — ABNORMAL LOW (ref 10.0–291.0)

## 2019-07-02 LAB — HEMOGLOBIN A1C: Hgb A1c MFr Bld: 5.6 % (ref 4.6–6.5)

## 2019-07-02 LAB — TSH: TSH: 3.47 u[IU]/mL (ref 0.35–4.50)

## 2019-07-02 LAB — FOLATE: Folate: 10.1 ng/mL (ref >5.9)

## 2019-07-02 LAB — C-REACTIVE PROTEIN: CRP: 1 mg/dL (ref 0.5–20.0)

## 2019-07-04 ENCOUNTER — Encounter: Payer: Self-pay | Admitting: Family Medicine

## 2019-07-04 ENCOUNTER — Telehealth: Payer: Self-pay | Admitting: Family Medicine

## 2019-07-04 DIAGNOSIS — E538 Deficiency of other specified B group vitamins: Secondary | ICD-10-CM | POA: Insufficient documentation

## 2019-07-04 NOTE — Telephone Encounter (Signed)
Pt called back inquiring about how many transfusions she should be having?

## 2019-07-05 LAB — TIQ- AMBIGUOUS ORDER

## 2019-07-05 LAB — LUPUS (SLE) ANALYSIS
Anti Nuclear Antibody (ANA): NEGATIVE
Anti-striation Abs: NEGATIVE
Complement C4, Serum: 36 mg/dL (ref 12–38)
ENA RNP Ab: <0.2 AI (ref 0.0–0.9)
ENA SM Ab Ser-aCnc: <0.2 AI (ref 0.0–0.9)
ENA SSA (RO) Ab: <0.2 AI (ref 0.0–0.9)
ENA SSB (LA) Ab: <0.2 AI (ref 0.0–0.9)
Mitochondrial Ab: <20 Units (ref 0.0–20.0)
Parietal Cell Ab: 14.1 Units (ref 0.0–20.0)
Scleroderma (Scl-70) (ENA) Antibody, IgG: <0.2 AI (ref 0.0–0.9)
Smooth Muscle Ab: 7 Units (ref 0–19)
Thyroperoxidase Ab SerPl-aCnc: 11 IU/mL (ref 0–34)
dsDNA Ab: <1 IU/mL (ref 0–9)

## 2019-07-05 NOTE — Telephone Encounter (Signed)
Attempted to call pt back, left a detailed message regarding pt concerns, advised to all the office

## 2019-07-06 LAB — RHEUMATOID FACTOR: Rheumatoid fact SerPl-aCnc: <14 IU/mL (ref <14)

## 2019-07-06 LAB — C3 AND C4
C3 Complement: 167 mg/dL (ref 83–193)
C4 Complement: 37 mg/dL (ref 15–57)

## 2019-07-06 LAB — IRON, TOTAL/TOTAL IRON BINDING CAP
%SAT: 4 % (calc) — ABNORMAL LOW (ref 16–45)
Iron: 14 ug/dL — ABNORMAL LOW (ref 40–190)
TIBC: 398 mcg/dL (calc) (ref 250–450)

## 2019-07-10 ENCOUNTER — Ambulatory Visit (HOSPITAL_COMMUNITY): Payer: Managed Care, Other (non HMO)

## 2019-07-18 ENCOUNTER — Other Ambulatory Visit: Payer: Self-pay

## 2019-07-19 ENCOUNTER — Encounter: Payer: Self-pay | Admitting: Family Medicine

## 2019-07-19 ENCOUNTER — Telehealth: Payer: Self-pay | Admitting: Family Medicine

## 2019-07-19 ENCOUNTER — Ambulatory Visit (INDEPENDENT_AMBULATORY_CARE_PROVIDER_SITE_OTHER): Payer: Managed Care, Other (non HMO) | Admitting: Family Medicine

## 2019-07-19 ENCOUNTER — Ambulatory Visit (HOSPITAL_COMMUNITY)
Admission: RE | Admit: 2019-07-19 | Discharge: 2019-07-19 | Disposition: A | Discharge status: Home / Self Care | Payer: Managed Care, Other (non HMO) | Source: Ambulatory Visit | Attending: Internal Medicine | Admitting: Internal Medicine

## 2019-07-19 VITALS — BP 138/90 | HR 78 | Temp 97.9°F | Wt >= 6400 oz

## 2019-07-19 DIAGNOSIS — R790 Abnormal level of blood mineral: Secondary | ICD-10-CM | POA: Diagnosis not present

## 2019-07-19 DIAGNOSIS — D649 Anemia, unspecified: Secondary | ICD-10-CM | POA: Insufficient documentation

## 2019-07-19 DIAGNOSIS — I10 Essential (primary) hypertension: Secondary | ICD-10-CM | POA: Diagnosis not present

## 2019-07-19 DIAGNOSIS — E538 Deficiency of other specified B group vitamins: Secondary | ICD-10-CM | POA: Diagnosis not present

## 2019-07-19 MED ORDER — BLOOD PRESSURE CUFF MISC: 0 refills | Status: DC

## 2019-07-19 MED ORDER — SODIUM CHLORIDE 0.9 % IV SOLN
INTRAVENOUS | Status: DC | PRN
  Administered 2019-07-19: 250 mL via INTRAVENOUS

## 2019-07-19 MED ORDER — CYANOCOBALAMIN 1000 MCG/ML IJ SOLN
1000.0000 ug | Freq: Once | INTRAMUSCULAR | Status: AC
  Administered 2019-07-19: 1000 ug via INTRAMUSCULAR

## 2019-07-19 MED ORDER — SODIUM CHLORIDE 0.9 % IV SOLN
510.0000 mg | Freq: Once | INTRAVENOUS | Status: AC
  Administered 2019-07-19: 510 mg via INTRAVENOUS
  Filled 2019-07-19: qty 510

## 2019-07-19 NOTE — Progress Notes (Signed)
Patient received IV Feraheme as ordered by Grier Mitts MD. Pre infusion BP readings were 155/115; 167/92; 157/91. MD's office was contacted. Per MD's assistant who consulted  Grier Mitts MD, RN should proceed with the infusion. Post infusion BP was 152/91. Observed for at least 30 minutes post infusion.Tolerated well, vitals stable, discharge instructions given, verbalized understanding. Patient alert, oriented and ambulatory at the time of discharge.

## 2019-07-19 NOTE — Discharge Instructions (Signed)

## 2019-07-19 NOTE — Telephone Encounter (Signed)
Spoke with [place] with [facility] regarding pt BP which was 155/115. Per  Dr Volanda Napoleon the infusion Center [facility] was advised to have pt drink plenty of water and to get pt at a calm place and then recheck pt BP. After about 15 minutes Infusion Tech called with the recheck of pt BP and reported as 157/91. Per Dr Volanda Napoleon approval pt was able to have the infusion done, pt seen at our clinic for elevated BP and B12 injection this afternoon

## 2019-07-19 NOTE — Progress Notes (Signed)
Subjective:    Patient ID: Shelby Chandler, female    DOB: [birth year], 29 y.o.   MRN: [id number]  No chief complaint on file.   HPI Patient was seen today for f/u and B12 injection.  Pt had Iron infusion this am.  At the appt pt's bp was elevated.  Pt notes rushing and feeling nervous prior to the appt as she was told to go to the wrong location.  Pt also notes a regular sized bp cuff was used to take her bp.    Past Medical History:  Diagnosis Date  . Hypertension     No Known Allergies  ROS General: Denies fever, chills, night sweats, changes in weight, changes in appetite HEENT: Denies headaches, ear pain, changes in vision, rhinorrhea, sore throat CV: Denies CP, palpitations, SOB, orthopnea Pulm: Denies SOB, cough, wheezing GI: Denies abdominal pain, nausea, vomiting, diarrhea, constipation GU: Denies dysuria, hematuria, frequency, vaginal discharge Msk: Denies muscle cramps, joint pains Neuro: Denies weakness, numbness, tingling Skin: Denies rashes, bruising Psych: Denies depression, anxiety, hallucinations    Objective:    Blood pressure 138/90, pulse 78, temperature 97.9 F (36.6 C), temperature source Temporal, weight (!) 431 lb (195.5 kg), SpO2 98 %.  Gen. Pleasant, well-nourished, in no distress, normal affect   HEENT: [place]/AT, face symmetric, no scleral icterus, PERRLA, EOMI, nares patent without drainage Lungs: no accessory muscle use, CTAB, no wheezes or rales Cardiovascular: RRR, no peripheral edema Musculoskeletal: No deformities, no cyanosis or clubbing, normal tone Neuro:  A&Ox3, CN II-XII intact, normal gait Skin:  Warm, no lesions/ rash   Wt Readings from Last 3 Encounters:  07/01/19 (!) 435 lb (197.3 kg)  09/28/17 (!) 406 lb (184.2 kg)  08/28/17 (!) 399 lb (181 kg)    Lab Results  Component Value Date   WBC 7.0 07/01/2019   HGB 9.9 (L) 07/01/2019   HCT 32.5 (L) 07/01/2019   PLT 441.0 (H) 07/01/2019   GLUCOSE 81 07/01/2019   CHOL 174  01/09/2017   TRIG 72 01/09/2017   HDL 51 01/09/2017   LDLCALC 109 (H) 01/09/2017   ALT 12 07/01/2019   AST 11 07/01/2019   NA 138 07/01/2019   K 4.2 07/01/2019   CL 104 07/01/2019   CREATININE 0.77 07/01/2019   BUN 8 07/01/2019   CO2 25 07/01/2019   TSH 3.47 07/01/2019   HGBA1C 5.6 07/01/2019    Assessment/Plan:  Essential hypertension  -bp improved from this am -possibly elevated d/t using incorrect cuff size and pt rushing/feeling anxious -discussed lifestyle modifications -pt to check bp at home -for continued elevation restart norvasc 5 mg - Plan: Blood Pressure Monitoring (BLOOD PRESSURE CUFF) MISC  Vitamin B12 deficiency -B12 inj given in clinic  - Plan: cyanocobalamin ((VITAMIN B-12)) injection 1,000 mcg  F/u in 1 month  Grier Mitts, MD

## 2019-07-19 NOTE — Patient Instructions (Signed)
Managing Your Hypertension Hypertension is commonly called high blood pressure. This is when the force of your blood pressing against the walls of your arteries is too strong. Arteries are blood vessels that carry blood from your heart throughout your body. Hypertension forces the heart to work harder to pump blood, and may cause the arteries to become narrow or stiff. Having untreated or uncontrolled hypertension can cause heart attack, stroke, kidney disease, and other problems. What are blood pressure readings? A blood pressure reading consists of a higher number over a lower number. Ideally, your blood pressure should be below 120/80. The first ("top") number is called the systolic pressure. It is a measure of the pressure in your arteries as your heart beats. The second ("bottom") number is called the diastolic pressure. It is a measure of the pressure in your arteries as the heart relaxes. What does my blood pressure reading mean? Blood pressure is classified into four stages. Based on your blood pressure reading, your health care provider may use the following stages to determine what type of treatment you need, if any. Systolic pressure and diastolic pressure are measured in a unit called mm Hg. Normal  Systolic pressure: below 120.  Diastolic pressure: below 80. Elevated  Systolic pressure: 120-129.  Diastolic pressure: below 80. Hypertension stage 1  Systolic pressure: 130-139.  Diastolic pressure: 80-89. Hypertension stage 2  Systolic pressure: 140 or above.  Diastolic pressure: 90 or above. What health risks are associated with hypertension? Managing your hypertension is an important responsibility. Uncontrolled hypertension can lead to:  A heart attack.  A stroke.  A weakened blood vessel (aneurysm).  Heart failure.  Kidney damage.  Eye damage.  Metabolic syndrome.  Memory and concentration problems. What changes can I make to manage my  hypertension? Hypertension can be managed by making lifestyle changes and possibly by taking medicines. Your health care provider will help you make a plan to bring your blood pressure within a normal range. Eating and drinking   Eat a diet that is high in fiber and potassium, and low in salt (sodium), added sugar, and fat. An example eating plan is called the DASH (Dietary Approaches to Stop Hypertension) diet. To eat this way: ? Eat plenty of fresh fruits and vegetables. Try to fill half of your plate at each meal with fruits and vegetables. ? Eat whole grains, such as whole wheat pasta, brown rice, or whole grain bread. Fill about one quarter of your plate with whole grains. ? Eat low-fat diary products. ? Avoid fatty cuts of meat, processed or cured meats, and poultry with skin. Fill about one quarter of your plate with lean proteins such as fish, chicken without skin, beans, eggs, and tofu. ? Avoid premade and processed foods. These tend to be higher in sodium, added sugar, and fat.  Reduce your daily sodium intake. Most people with hypertension should eat less than 1,500 mg of sodium a day.  Limit alcohol intake to no more than 1 drink a day for nonpregnant women and 2 drinks a day for men. One drink equals 12 oz of beer, 5 oz of wine, or 1 oz of hard liquor. Lifestyle  Work with your health care provider to maintain a healthy body weight, or to lose weight. Ask what an ideal weight is for you.  Get at least 30 minutes of exercise that causes your heart to beat faster (aerobic exercise) most days of the week. Activities may include walking, swimming, or biking.  Include exercise   to strengthen your muscles (resistance exercise), such as weight lifting, as part of your weekly exercise routine. Try to do these types of exercises for 30 minutes at least 3 days a week.  Do not use any products that contain nicotine or tobacco, such as cigarettes and e-cigarettes. If you need help quitting,  ask your health care provider.  Control any long-term (chronic) conditions you have, such as high cholesterol or diabetes. Monitoring  Monitor your blood pressure at home as told by your health care provider. Your personal target blood pressure may vary depending on your medical conditions, your age, and other factors.  Have your blood pressure checked regularly, as often as told by your health care provider. Working with your health care provider  Review all the medicines you take with your health care provider because there may be side effects or interactions.  Talk with your health care provider about your diet, exercise habits, and other lifestyle factors that may be contributing to hypertension.  Visit your health care provider regularly. Your health care provider can help you create and adjust your plan for managing hypertension. Will I need medicine to control my blood pressure? Your health care provider may prescribe medicine if lifestyle changes are not enough to get your blood pressure under control, and if:  Your systolic blood pressure is 130 or higher.  Your diastolic blood pressure is 80 or higher. Take medicines only as told by your health care provider. Follow the directions carefully. Blood pressure medicines must be taken as prescribed. The medicine does not work as well when you skip doses. Skipping doses also puts you at risk for problems. Contact a health care provider if:  You think you are having a reaction to medicines you have taken.  You have repeated (recurrent) headaches.  You feel dizzy.  You have swelling in your ankles.  You have trouble with your vision. Get help right away if:  You develop a severe headache or confusion.  You have unusual weakness or numbness, or you feel faint.  You have severe pain in your chest or abdomen.  You vomit repeatedly.  You have trouble breathing. Summary  Hypertension is when the force of blood pumping  through your arteries is too strong. If this condition is not controlled, it may put you at risk for serious complications.  Your personal target blood pressure may vary depending on your medical conditions, your age, and other factors. For most people, a normal blood pressure is less than 120/80.  Hypertension is managed by lifestyle changes, medicines, or both. Lifestyle changes include weight loss, eating a healthy, low-sodium diet, exercising more, and limiting alcohol. This information is not intended to replace advice given to you by your health care provider. Make sure you discuss any questions you have with your health care provider. Document Revised: 05/18/2018 Document Reviewed: 12/23/2015 Elsevier Patient Education  2020 [place].  Vitamin B12 Deficiency Vitamin B12 deficiency occurs when the body does not have enough vitamin B12, which is an important vitamin. The body needs this vitamin:  To make red blood cells.  To make DNA. This is the genetic material inside cells.  To help the nerves work properly so they can carry messages from the brain to the body. Vitamin B12 deficiency can cause various health problems, such as a low red blood cell count (anemia) or nerve damage. What are the causes? This condition may be caused by:  Not eating enough foods that contain vitamin B12.  Not  having enough stomach acid and digestive fluids to properly absorb vitamin B12 from the food that you eat.  Certain digestive system diseases that make it hard to absorb vitamin B12. These diseases include Crohn's disease, chronic pancreatitis, and cystic fibrosis.  A condition in which the body does not make enough of a protein (intrinsic factor), resulting in too few red blood cells (pernicious anemia).  Having a surgery in which part of the stomach or small intestine is removed.  Taking certain medicines that make it hard for the body to absorb vitamin B12. These medicines  include: ? Heartburn medicines (antacids and proton pump inhibitors). ? Certain antibiotic medicines. ? Some medicines that are used to treat diabetes, tuberculosis, gout, or high cholesterol. What increases the risk? The following factors may make you more likely to develop a B12 deficiency:  Being older than age 66.  Eating a vegetarian or vegan diet, especially while you are pregnant.  Eating a poor diet while you are pregnant.  Taking certain medicines.  Having alcoholism. What are the signs or symptoms? In some cases, there are no symptoms of this condition. If the condition leads to anemia or nerve damage, various symptoms can occur, such as:  Weakness.  Fatigue.  Loss of appetite.  Weight loss.  Numbness or tingling in your hands and feet.  Redness and burning of the tongue.  Confusion or memory problems.  Depression.  Sensory problems, such as color blindness, ringing in the ears, or loss of taste.  Diarrhea or constipation.  Trouble walking. If anemia is severe, symptoms can include:  Shortness of breath.  Dizziness.  Rapid heart rate (tachycardia). How is this diagnosed? This condition may be diagnosed with a blood test to measure the level of vitamin B12 in your blood. You may also have other tests, including:  A group of tests that measure certain characteristics of blood cells (complete blood count, CBC).  A blood test to measure intrinsic factor.  A procedure where a thin tube with a camera on the end is used to look into your stomach or intestines (endoscopy). Other tests may be needed to discover the cause of B12 deficiency. How is this treated? Treatment for this condition depends on the cause. This condition may be treated by:  Changing your eating and drinking habits, such as: ? Eating more foods that contain vitamin B12. ? Drinking less alcohol or no alcohol.  Getting vitamin B12 injections.  Taking vitamin B12 supplements. Your  health care provider will tell you which dosage is best for you. Follow these instructions at home: Eating and drinking   Eat lots of healthy foods that contain vitamin B12, including: ? Meats and poultry. This includes beef, pork, chicken, [place], and organ meats, such as liver. ? Seafood. This includes clams, rainbow trout, salmon, tuna, and haddock. ? Eggs. ? Cereal and dairy products that are fortified. This means that vitamin B12 has been added to the food. Check the label on the package to see if the food is fortified. The items listed above may not be a complete list of recommended foods and beverages. Contact a dietitian for more information. General instructions  Get any injections that are prescribed by your health care provider.  Take supplements only as told by your health care provider. Follow the directions carefully.  Do not drink alcohol if your health care provider tells you not to. In some cases, you may only be asked to limit alcohol use.  Keep all follow-up visits as  told by your health care provider. This is important. Contact a health care provider if:  Your symptoms come back. Get help right away if you:  Develop shortness of breath.  Have a rapid heart rate.  Have chest pain.  Become dizzy or lose consciousness. Summary  Vitamin B12 deficiency occurs when the body does not have enough vitamin B12.  The main causes of vitamin B12 deficiency include dietary deficiency, digestive diseases, pernicious anemia, and having a surgery in which part of the stomach or small intestine is removed.  In some cases, there are no symptoms of this condition. If the condition leads to anemia or nerve damage, various symptoms can occur, such as weakness, shortness of breath, and numbness.  Treatment may include getting vitamin B12 injections or taking vitamin B12 supplements. Eat lots of healthy foods that contain vitamin B12. This information is not intended to replace  advice given to you by your health care provider. Make sure you discuss any questions you have with your health care provider. Document Revised: 07/13/2018 Document Reviewed: 10/03/2017 Elsevier Patient Education  [place].  How to Take Your Blood Pressure You can take your blood pressure at home with a machine. You may need to check your blood pressure at home:  To check if you have high blood pressure (hypertension).  To check your blood pressure over time.  To make sure your blood pressure medicine is working. Supplies needed: You will need a blood pressure machine, or monitor. You can buy one at a drugstore or online. When choosing one:  Choose one with an arm cuff.  Choose one that wraps around your upper arm. Only one finger should fit between your arm and the cuff.  Do not choose one that measures your blood pressure from your wrist or finger. Your doctor can suggest a monitor. How to prepare Avoid these things for 30 minutes before checking your blood pressure:  Drinking caffeine.  Drinking alcohol.  Eating.  Smoking.  Exercising. Five minutes before checking your blood pressure:  Pee.  Sit in a dining chair. Avoid sitting in a soft couch or armchair.  Be quiet. Do not talk. How to take your blood pressure Follow the instructions that came with your machine. If you have a digital blood pressure monitor, these may be the instructions: 1. Sit up straight. 2. Place your feet on the floor. Do not cross your ankles or legs. 3. Rest your left arm at the level of your heart. You may rest it on a table, desk, or chair. 4. Pull up your shirt sleeve. 5. Wrap the blood pressure cuff around the upper part of your left arm. The cuff should be 1 inch (2.5 cm) above your elbow. It is best to wrap the cuff around bare skin. 6. Fit the cuff snugly around your arm. You should be able to place only one finger between the cuff and your arm. 7. Put the cord inside the  groove of your elbow. 8. Press the power button. 9. Sit quietly while the cuff fills with air and loses air. 10. Write down the numbers on the screen. 11. Wait 2-3 minutes and then repeat steps 1-10. What do the numbers mean? Two numbers make up your blood pressure. The first number is called systolic pressure. The second is called diastolic pressure. An example of a blood pressure reading is "120 over 80" (or 120/80). If you are an adult and do not have a medical condition, use this guide to  find out if your blood pressure is normal: Normal  First number: below 120.  Second number: below 80. Elevated  First number: 120-129.  Second number: below 80. Hypertension stage 1  First number: 130-139.  Second number: 80-89. Hypertension stage 2  First number: 140 or above.  Second number: 21 or above. Your blood pressure is above normal even if only the top or bottom number is above normal. Follow these instructions at home:  Check your blood pressure as often as your doctor tells you to.  Take your monitor to your next doctor's appointment. Your doctor will: ? Make sure you are using it correctly. ? Make sure it is working right.  Make sure you understand what your blood pressure numbers should be.  Tell your doctor if your medicines are causing side effects. Contact a doctor if:  Your blood pressure keeps being high. Get help right away if:  Your first blood pressure number is higher than 180.  Your second blood pressure number is higher than 120. This information is not intended to replace advice given to you by your health care provider. Make sure you discuss any questions you have with your health care provider. Document Revised: 01/06/2017 Document Reviewed: 07/03/2015 Elsevier Patient Education  2020 [employer].

## 2019-07-19 NOTE — Telephone Encounter (Signed)
Ofagie from [facility] stated he went to get vital signs from the pt before her IN fusion and her bp was high 155/115 and he is wondering what her PCP wants to do?   He needs a call back ASAP at [phone]  Pt is wondering how many times she needs to get the infusion?

## 2019-08-07 ENCOUNTER — Encounter: Payer: Managed Care, Other (non HMO) | Attending: Family Medicine | Admitting: Skilled Nursing Facility1

## 2019-08-07 ENCOUNTER — Encounter: Payer: Self-pay | Admitting: Skilled Nursing Facility1

## 2019-08-07 ENCOUNTER — Other Ambulatory Visit: Payer: Self-pay

## 2019-08-07 DIAGNOSIS — Z6841 Body Mass Index (BMI) 40.0 and over, adult: Secondary | ICD-10-CM | POA: Insufficient documentation

## 2019-08-07 NOTE — Progress Notes (Signed)
  Assessment:  Primary concerns today: weight managment.   Pt states she stopped eating meat and lost 30 pounds then the pandemic happened and gained her weight back when she was able to maintaine 390 pounds. Pt states she just wants to be healthy. Pt state she has been working on emotional eating with a therapist.  Pt states she works with the [employer] group psychologist. 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  Pt states she logs what she eats and likes to see her exercise.  Pt states she is anemic.  Iron 14 B12 209  Pt is currently working on her relationship with food and her own body.   MEDICATIONS: see list   DIETARY INTAKE:  Usual eating pattern includes 3 meals and 1-2 snacks per day.  Everyday foods include 3.  Avoided foods include none stated.    24-hr recall:  B ( AM): muscle milk and yogurtt and granola bar and cheese or oatmeal Snk ( AM):  L ( PM): egg salad or salad or corn bake or fast food Snk ( PM):  D ( PM): salad or fast food Snk ( PM): popcicle Beverages: water    Intervention:  Nutrition cousneling. Dietitian educated the pt on the importance of mindfulness to help her reach her health goals within the context of eating.  Goals: Use your check list and should I eat sheet to help identify if you are eating due to appetite or hunger  Identify diet nonsense or truth about food thoughts Work on staying present with your meals by getting rid of distractions and eating at the table with no phone or TV Continue to indentify satisfaction verses fulness and listening to [employer] how to trust your own bodies cue and have the confidence to follow them   Teaching Method Utilized:  Visual Auditory Hands on  Handouts given during visit include:  Should I eat sheet  Meal check box  Barriers to learning/adherence to lifestyle change: emotional  eating  Demonstrated degree of understanding via:  Teach Back   Monitoring/Evaluation:  Dietary intake, exercise,  and body weight prn.

## 2019-08-16 ENCOUNTER — Ambulatory Visit (INDEPENDENT_AMBULATORY_CARE_PROVIDER_SITE_OTHER): Payer: Managed Care, Other (non HMO)

## 2019-08-16 ENCOUNTER — Ambulatory Visit: Payer: Managed Care, Other (non HMO) | Admitting: Family Medicine

## 2019-08-16 ENCOUNTER — Other Ambulatory Visit: Payer: Self-pay

## 2019-08-16 DIAGNOSIS — E538 Deficiency of other specified B group vitamins: Secondary | ICD-10-CM

## 2019-08-16 MED ORDER — CYANOCOBALAMIN 1000 MCG/ML IJ SOLN
1000.0000 ug | Freq: Once | INTRAMUSCULAR | Status: AC
  Administered 2019-08-16: 1000 ug via INTRAMUSCULAR

## 2019-08-16 NOTE — Progress Notes (Signed)
Per orders of , injection of B12 given in left deltoid by Franco Collet. Patient tolerated injection well.

## 2019-08-16 NOTE — Patient Instructions (Signed)
Health Maintenance Due  Topic Date Due   TETANUS/TDAP  Never done    Depression screen [facility] 2 2/9 08/28/2017 01/09/2017 12/28/2016  Decreased Interest 3 0 0  Down, Depressed, Hopeless 1 0 0  PHQ - 2 Score 4 0 0  Altered sleeping 2 - -  Tired, decreased energy 3 - -  Change in appetite 3 - -  Feeling bad or failure about yourself  2 - -  Trouble concentrating 1 - -  Moving slowly or fidgety/restless 2 - -  Suicidal thoughts 0 - -  PHQ-9 Score 17 - -

## 2019-09-05 ENCOUNTER — Ambulatory Visit: Payer: Managed Care, Other (non HMO) | Admitting: Skilled Nursing Facility1

## 2019-09-13 ENCOUNTER — Ambulatory Visit (INDEPENDENT_AMBULATORY_CARE_PROVIDER_SITE_OTHER): Payer: Managed Care, Other (non HMO)

## 2019-09-13 ENCOUNTER — Other Ambulatory Visit: Payer: Self-pay

## 2019-09-13 ENCOUNTER — Ambulatory Visit: Payer: Managed Care, Other (non HMO) | Admitting: Family Medicine

## 2019-09-13 DIAGNOSIS — E538 Deficiency of other specified B group vitamins: Secondary | ICD-10-CM | POA: Diagnosis not present

## 2019-09-13 MED ORDER — CYANOCOBALAMIN 1000 MCG/ML IJ SOLN
1000.0000 ug | Freq: Once | INTRAMUSCULAR | Status: AC
  Administered 2019-09-13: 1000 ug via INTRAMUSCULAR

## 2019-09-13 NOTE — Progress Notes (Signed)
Per orders of Dr. Banks, injection of Cyanocobalamin 1,000 mcg/mL given by Violet Cart N Marcena Dias. Patient tolerated injection well.  

## 2019-09-16 ENCOUNTER — Ambulatory Visit: Payer: Managed Care, Other (non HMO)

## 2019-10-10 ENCOUNTER — Other Ambulatory Visit: Payer: Self-pay

## 2019-10-10 ENCOUNTER — Telehealth: Payer: Self-pay

## 2019-10-10 DIAGNOSIS — I10 Essential (primary) hypertension: Secondary | ICD-10-CM

## 2019-10-10 MED ORDER — AMLODIPINE BESYLATE 5 MG PO TABS: 5.0000 mg | ORAL_TABLET | Freq: Every day | ORAL | 1 refills | Status: DC

## 2019-10-10 NOTE — Telephone Encounter (Signed)
Pt came to the clinic this morning due to Elevated BP, Pt bp was taken by our [place] and the reading was 160/100. Dr Volanda Napoleon was notified of pt BP reading and advised pt to start back  taking Amlodipine 5 mg daily. Rx sent to pt pharmacy, pt advised to make a f/u appointment in 1-2 weeks for Bp, PT verbalized understanding to take records of her BP and to bring them at her f/u visit per Dr Volanda Napoleon

## 2019-10-24 ENCOUNTER — Ambulatory Visit: Payer: Managed Care, Other (non HMO) | Admitting: Family Medicine

## 2019-10-24 ENCOUNTER — Other Ambulatory Visit: Payer: Self-pay

## 2019-10-24 ENCOUNTER — Encounter: Payer: Self-pay | Admitting: Family Medicine

## 2019-10-24 VITALS — BP 134/88 | HR 82 | Temp 98.1°F | Wt >= 6400 oz

## 2019-10-24 DIAGNOSIS — R5383 Other fatigue: Secondary | ICD-10-CM

## 2019-10-24 DIAGNOSIS — I10 Essential (primary) hypertension: Secondary | ICD-10-CM | POA: Diagnosis not present

## 2019-10-24 DIAGNOSIS — D508 Other iron deficiency anemias: Secondary | ICD-10-CM

## 2019-10-24 DIAGNOSIS — E538 Deficiency of other specified B group vitamins: Secondary | ICD-10-CM | POA: Diagnosis not present

## 2019-10-24 DIAGNOSIS — Z6841 Body Mass Index (BMI) 40.0 and over, adult: Secondary | ICD-10-CM

## 2019-10-24 NOTE — Progress Notes (Signed)
Subjective:    Patient ID: Shelby Chandler, female    DOB: Jul 28, 1990, 29 y.o.   MRN: [id number]  No chief complaint on file.   HPI Pt is a 29 yo female with pmh sig for HTN, obesity, iron def anemia, HSV, h/o depression, h/o anxiety who was seen for f/u.  Pt taking norvasc 5 mg for bp.  Pt expresses frustration as her insurance plan does not cover gastric surgery.  Pt exercising, eating salads and vegetables, and drinking water but has not seen much change on the scale.  Pt endorses feeling "trapped" in her body 2/2 to her weight.  Pt also endorses fatigue.  Past Medical History:  Diagnosis Date   Hypertension     No Known Allergies  ROS General: Denies fever, chills, night sweats, changes in weight, changes in appetite  +fatigue HEENT: Denies headaches, ear pain, changes in vision, rhinorrhea, sore throat CV: Denies CP, palpitations, SOB, orthopnea Pulm: Denies SOB, cough, wheezing GI: Denies abdominal pain, nausea, vomiting, diarrhea, constipation GU: Denies dysuria, hematuria, frequency, vaginal discharge Msk: Denies muscle cramps, joint pains Neuro: Denies weakness, numbness, tingling Skin: Denies rashes, bruising Psych: Denies depression, anxiety, hallucinations    Objective:    Blood pressure 134/88, pulse 82, temperature 98.1 F (36.7 C), temperature source Oral, weight (!) 425 lb (192.8 kg), SpO2 97 %.  Gen. Pleasant, well-nourished, tearful at times, normal affect   HEENT: [place]/AT, face symmetric, conjunctiva clear, no scleral icterus, PERRLA, EOMI, nares patent without drainage Lungs: no accessory muscle use Cardiovascular: RRR, no peripheral edema Musculoskeletal: No deformities, no cyanosis or clubbing, normal tone Neuro:  A&Ox3, CN II-XII intact, normal gait Skin:  Warm, no lesions/ rash  Wt Readings from Last 3 Encounters:  08/07/19 (!) 426 lb (193.2 kg)  07/19/19 (!) 431 lb (195.5 kg)  07/01/19 (!) 435 lb (197.3 kg)    Lab Results  Component Value  Date   WBC 7.0 07/01/2019   HGB 9.9 (L) 07/01/2019   HCT 32.5 (L) 07/01/2019   PLT 441.0 (H) 07/01/2019   GLUCOSE 81 07/01/2019   CHOL 174 01/09/2017   TRIG 72 01/09/2017   HDL 51 01/09/2017   LDLCALC 109 (H) 01/09/2017   ALT 12 07/01/2019   AST 11 07/01/2019   NA 138 07/01/2019   K 4.2 07/01/2019   CL 104 07/01/2019   CREATININE 0.77 07/01/2019   BUN 8 07/01/2019   CO2 25 07/01/2019   TSH 3.47 07/01/2019   HGBA1C 5.6 07/01/2019    Assessment/Plan:  Essential hypertension  -stable.  Mildly elevated in clinic -will increase norvasc from 5 mg to 10 mg. -advised to monitor for LE edema with increase in norvasc dose. -Will wait to send in rx as just filled rx for 5 mg tabs. -continue lifestyle modifications and checking bp at home - Plan: BMP with eGFR([facility]), PSG Sleep Study, BMP with eGFR([facility])  Other iron deficiency anemia  -hgb 9.9 on 07/01/19.  Pt s/p feraheme infusion -will recheck labs -continue po iron supplements - Plan: Iron and TIBC, CBC with Differential/Platelet, CBC with Differential/Platelet, Iron and TIBC  Vitamin B12 deficiency  -vitamin B12 209 on 07/01/19 -s/p B12 injection x 2 -will recheck level - Plan: Vitamin B12  Fatigue, unspecified type  -discussed possible causes including obesity hypoventilation syndrome/OSA, vitamin deficiency, anemia - Plan: Vitamin D, 25-hydroxy, PSG Sleep Study, Vitamin D, 25-hydroxy  Class 3 severe obesity due to excess calories without serious comorbidity with body mass index (BMI) greater than or equal to  31 in adult [facility])  -pt encouraged to continue lifestyle modifications -discussed various options to aid with weight loss.  Pt advised to contact her insurance company to see what they cover. -will obtain labs and make further recommendations when results available. - Plan: PSG Sleep Study  F/u in 1 month  Grier Mitts, MD

## 2019-10-24 NOTE — Patient Instructions (Signed)
Anemia  Anemia is a condition in which you do not have enough red blood cells or hemoglobin. Hemoglobin is a substance in red blood cells that carries oxygen. When you do not have enough red blood cells or hemoglobin (are anemic), your body cannot get enough oxygen and your organs may not work properly. As a result, you may feel very tired or have other problems. What are the causes? Common causes of anemia include:  Excessive bleeding. Anemia can be caused by excessive bleeding inside or outside the body, including bleeding from the intestine or from periods in women.  Poor nutrition.  Long-lasting (chronic) kidney, thyroid, and liver disease.  Bone marrow disorders.  Cancer and treatments for cancer.  HIV (human immunodeficiency virus) and AIDS (acquired immunodeficiency syndrome).  Treatments for HIV and AIDS.  Spleen problems.  Blood disorders.  Infections, medicines, and autoimmune disorders that destroy red blood cells. What are the signs or symptoms? Symptoms of this condition include:  Minor weakness.  Dizziness.  Headache.  Feeling heartbeats that are irregular or faster than normal (palpitations).  Shortness of breath, especially with exercise.  Paleness.  Cold sensitivity.  Indigestion.  Nausea.  Difficulty sleeping.  Difficulty concentrating. Symptoms may occur suddenly or develop slowly. If your anemia is mild, you may not have symptoms. How is this diagnosed? This condition is diagnosed based on:  Blood tests.  Your medical history.  A physical exam.  Bone marrow biopsy. Your health care provider may also check your stool (feces) for blood and may do additional testing to look for the cause of your bleeding. You may also have other tests, including:  Imaging tests, such as a CT scan or MRI.  Endoscopy.  Colonoscopy. How is this treated? Treatment for this condition depends on the cause. If you continue to lose a lot of blood, you may  need to be treated at a hospital. Treatment may include:  Taking supplements of iron, vitamin S31, or folic acid.  Taking a hormone medicine (erythropoietin) that can help to stimulate red blood cell growth.  Having a blood transfusion. This may be needed if you lose a lot of blood.  Making changes to your diet.  Having surgery to remove your spleen. Follow these instructions at home:  Take over-the-counter and prescription medicines only as told by your health care provider.  Take supplements only as told by your health care provider.  Follow any diet instructions that you were given.  Keep all follow-up visits as told by your health care provider. This is important. Contact a health care provider if:  You develop new bleeding anywhere in the body. Get help right away if:  You are very weak.  You are short of breath.  You have pain in your abdomen or chest.  You are dizzy or feel faint.  You have trouble concentrating.  You have bloody or black, tarry stools.  You vomit repeatedly or you vomit up blood. Summary  Anemia is a condition in which you do not have enough red blood cells or enough of a substance in your red blood cells that carries oxygen (hemoglobin).  Symptoms may occur suddenly or develop slowly.  If your anemia is mild, you may not have symptoms.  This condition is diagnosed with blood tests as well as a medical history and physical exam. Other tests may be needed.  Treatment for this condition depends on the cause of the anemia. This information is not intended to replace advice given to you by  your health care provider. Make sure you discuss any questions you have with your health care provider. Document Revised: 01/06/2017 Document Reviewed: 02/26/2016 Elsevier Patient Education  [place].  Obesity Hypoventilation Syndrome  Obesity hypoventilation syndrome (OHS) means that you are not breathing well enough to get air in and out of  your lungs efficiently (ventilation). This causes a low oxygen level and a high carbon dioxide level in your blood (hypoventilation). Having too much total body fat (obesity) is a significant risk factor for developing OHS. OHS makes it harder for your heart to pump oxygen-rich blood to your body. It can cause sleep disturbances and make you feel sleepy during the day. Over time, OHS can increase your risk for:  Heart disease.  High blood pressure (hypertension).  Reduced ability to absorb sugar from the bloodstream (insulin resistance).  Heart failure. Over time, OHS weakens your heart and can lead to heart failure. What are the causes? The exact cause of OHS is not known. Possible causes include:  Pressure on the lungs from excess body weight.  Obesity-related changes in how much air the lungs can hold (lung capacity) and how much they can expand (lung compliance).  Failure of the brain to regulate oxygen and carbon dioxide levels properly.  Chemicals (hormones) produced by excess fat cells interfering with breathing regulation.  A breathing condition in which breathing pauses or becomes shallow during sleep (sleep apnea). This condition can eventually cause the body to ventilate poorly and to hold onto carbon dioxide during the day. What increases the risk? You may have a greater risk for OHS if you:  Have a BMI of 30 or higher. BMI is an estimate of body fat that is calculated from height and weight. For adults, a BMI of 30 or higher is considered obese.  Are 58?30 years old.  Carry most of your excess weight around your waist.  Experience moderate symptoms of sleep apnea. What are the signs or symptoms? The most common symptoms of OHS are:  Daytime sleepiness.  Lack of energy.  Shortness of breath.  Morning headaches.  Sleep apnea.  Trouble concentrating.  Irritability, mood swings, or depression.  Swollen veins in the neck.  Swelling of the legs. How is this  diagnosed? Your health care provider may suspect OHS if you are obese and have poor breathing during the day and at night. Your health care provider will also do a physical exam. You may have tests to:  Measure your BMI.  Measure your blood oxygen level with a sensor placed on your finger (pulse oximetry).  Measure blood oxygen and carbon dioxide in a blood sample.  Measure the amount of red blood cells in a blood sample. OHS causes the number of red blood cells you have to increase (polycythemia).  Check your breathing ability (pulmonary function testing).  Check your breathing ability, breathing patterns, and oxygen level while you sleep (sleep study). You may also have a chest X-ray to rule out other breathing problems. You may have an electrocardiogram (ECG) and or echocardiogram to check for signs of heart failure. How is this treated? Weight loss is the most important part of treatment for OHS, and it may be the only treatment that you need. Other treatments may include:  Using a device to open your airway while you sleep, such as a continuous positive airway pressure (CPAP) machine that delivers oxygen to your airway through a mask.  Surgery (gastric bypass surgery) to lower your BMI. This may be  needed if: ? You are very obese. ? Other treatments have not worked for you. ? Your OHS is very severe and is causing organ damage, such as heart failure. Follow these instructions at home:  Medicines  Take over-the-counter and prescription medicines only as told by your health care provider.  Ask your health care provider what medicines are safe for you. You may be told to avoid medicines that can impair breathing and make OHS worse, such as sedatives and narcotics. Sleeping habits  If you are prescribed a CPAP machine, make sure you understand and use the machine as directed.  Try to get 8 hours of sleep every night.  Go to bed at the same time every night, and get up at the  same time every day. General instructions  Work with your health care provider to make a diet and exercise plan that helps you reach and maintain a healthy weight.  Eat a healthy diet.  Avoid smoking.  Exercise regularly as told by your health care provider.  During the evening, do not drink caffeine and do not eat heavy meals.  Keep all follow-up visits as told by your health care provider. This is important. Contact a health care provider if:  You experience new or worsening shortness of breath.  You have chest pain.  You have an irregular heartbeat (palpitations).  You have dizziness.  You faint.  You develop a cough.  You have a fever.  You have chest pain when you breathe (pleurisy). This information is not intended to replace advice given to you by your health care provider. Make sure you discuss any questions you have with your health care provider. Document Revised: 05/18/2018 Document Reviewed: 07/06/2015 Elsevier Patient Education  2020 [place].  Vitamin B12 Deficiency Vitamin B12 deficiency occurs when the body does not have enough vitamin B12, which is an important vitamin. The body needs this vitamin:  To make red blood cells.  To make DNA. This is the genetic material inside cells.  To help the nerves work properly so they can carry messages from the brain to the body. Vitamin B12 deficiency can cause various health problems, such as a low red blood cell count (anemia) or nerve damage. What are the causes? This condition may be caused by:  Not eating enough foods that contain vitamin B12.  Not having enough stomach acid and digestive fluids to properly absorb vitamin B12 from the food that you eat.  Certain digestive system diseases that make it hard to absorb vitamin B12. These diseases include Crohn's disease, chronic pancreatitis, and cystic fibrosis.  A condition in which the body does not make enough of a protein (intrinsic factor),  resulting in too few red blood cells (pernicious anemia).  Having a surgery in which part of the stomach or small intestine is removed.  Taking certain medicines that make it hard for the body to absorb vitamin B12. These medicines include: ? Heartburn medicines (antacids and proton pump inhibitors). ? Certain antibiotic medicines. ? Some medicines that are used to treat diabetes, tuberculosis, gout, or high cholesterol. What increases the risk? The following factors may make you more likely to develop a B12 deficiency:  Being older than age 78.  Eating a vegetarian or vegan diet, especially while you are pregnant.  Eating a poor diet while you are pregnant.  Taking certain medicines.  Having alcoholism. What are the signs or symptoms? In some cases, there are no symptoms of this condition. If the condition leads  to anemia or nerve damage, various symptoms can occur, such as:  Weakness.  Fatigue.  Loss of appetite.  Weight loss.  Numbness or tingling in your hands and feet.  Redness and burning of the tongue.  Confusion or memory problems.  Depression.  Sensory problems, such as color blindness, ringing in the ears, or loss of taste.  Diarrhea or constipation.  Trouble walking. If anemia is severe, symptoms can include:  Shortness of breath.  Dizziness.  Rapid heart rate (tachycardia). How is this diagnosed? This condition may be diagnosed with a blood test to measure the level of vitamin B12 in your blood. You may also have other tests, including:  A group of tests that measure certain characteristics of blood cells (complete blood count, CBC).  A blood test to measure intrinsic factor.  A procedure where a thin tube with a camera on the end is used to look into your stomach or intestines (endoscopy). Other tests may be needed to discover the cause of B12 deficiency. How is this treated? Treatment for this condition depends on the cause. This condition  may be treated by:  Changing your eating and drinking habits, such as: ? Eating more foods that contain vitamin B12. ? Drinking less alcohol or no alcohol.  Getting vitamin B12 injections.  Taking vitamin B12 supplements. Your health care provider will tell you which dosage is best for you. Follow these instructions at home: Eating and drinking   Eat lots of healthy foods that contain vitamin B12, including: ? Meats and poultry. This includes beef, pork, chicken, [place], and organ meats, such as liver. ? Seafood. This includes clams, rainbow trout, salmon, tuna, and haddock. ? Eggs. ? Cereal and dairy products that are fortified. This means that vitamin B12 has been added to the food. Check the label on the package to see if the food is fortified. The items listed above may not be a complete list of recommended foods and beverages. Contact a dietitian for more information. General instructions  Get any injections that are prescribed by your health care provider.  Take supplements only as told by your health care provider. Follow the directions carefully.  Do not drink alcohol if your health care provider tells you not to. In some cases, you may only be asked to limit alcohol use.  Keep all follow-up visits as told by your health care provider. This is important. Contact a health care provider if:  Your symptoms come back. Get help right away if you:  Develop shortness of breath.  Have a rapid heart rate.  Have chest pain.  Become dizzy or lose consciousness. Summary  Vitamin B12 deficiency occurs when the body does not have enough vitamin B12.  The main causes of vitamin B12 deficiency include dietary deficiency, digestive diseases, pernicious anemia, and having a surgery in which part of the stomach or small intestine is removed.  In some cases, there are no symptoms of this condition. If the condition leads to anemia or nerve damage, various symptoms can occur, such as  weakness, shortness of breath, and numbness.  Treatment may include getting vitamin B12 injections or taking vitamin B12 supplements. Eat lots of healthy foods that contain vitamin B12. This information is not intended to replace advice given to you by your health care provider. Make sure you discuss any questions you have with your health care provider. Document Revised: 07/13/2018 Document Reviewed: 10/03/2017 Elsevier Patient Education  2020 [employer].

## 2019-10-25 LAB — CBC WITH DIFFERENTIAL/PLATELET
Absolute Monocytes: 490 cells/uL (ref 200–950)
Basophils Absolute: 50 cells/uL (ref 0–200)
Basophils Relative: 0.7 %
Eosinophils Absolute: 178 cells/uL (ref 15–500)
Eosinophils Relative: 2.5 %
HCT: 36 % (ref 35.0–45.0)
Hemoglobin: 11.4 g/dL — ABNORMAL LOW (ref 11.7–15.5)
Lymphs Abs: 2776 cells/uL (ref 850–3900)
MCH: 23 pg — ABNORMAL LOW (ref 27.0–33.0)
MCHC: 31.7 g/dL — ABNORMAL LOW (ref 32.0–36.0)
MCV: 72.6 fL — ABNORMAL LOW (ref 80.0–100.0)
MPV: 9.6 fL (ref 7.5–12.5)
Monocytes Relative: 6.9 %
Neutro Abs: 3607 cells/uL (ref 1500–7800)
Neutrophils Relative %: 50.8 %
Platelets: 509 10*3/uL — ABNORMAL HIGH (ref 140–400)
RBC: 4.96 10*6/uL (ref 3.80–5.10)
RDW: 15.4 % — ABNORMAL HIGH (ref 11.0–15.0)
Total Lymphocyte: 39.1 %
WBC: 7.1 10*3/uL (ref 3.8–10.8)

## 2019-10-25 LAB — VITAMIN D 25 HYDROXY (VIT D DEFICIENCY, FRACTURES): Vit D, 25-Hydroxy: 20 ng/mL — ABNORMAL LOW (ref 30–100)

## 2019-10-25 LAB — BASIC METABOLIC PANEL WITH GFR
BUN: 10 mg/dL (ref 7–25)
CO2: 26 mmol/L (ref 20–32)
Calcium: 9.3 mg/dL (ref 8.6–10.2)
Chloride: 103 mmol/L (ref 98–110)
Creat: 0.81 mg/dL (ref 0.50–1.10)
GFR, Est African American: 113 mL/min/{1.73_m2} (ref >=60)
GFR, Est Non African American: 97 mL/min/{1.73_m2} (ref >=60)
Glucose, Bld: 81 mg/dL (ref 65–99)
Potassium: 4 mmol/L (ref 3.5–5.3)
Sodium: 138 mmol/L (ref 135–146)

## 2019-10-25 LAB — VITAMIN B12: Vitamin B-12: 520 pg/mL (ref 200–1100)

## 2019-10-25 LAB — IRON, TOTAL/TOTAL IRON BINDING CAP
%SAT: 7 % (calc) — ABNORMAL LOW (ref 16–45)
Iron: 29 ug/dL — ABNORMAL LOW (ref 40–190)
TIBC: 409 mcg/dL (calc) (ref 250–450)

## 2019-11-05 ENCOUNTER — Other Ambulatory Visit: Payer: Self-pay | Admitting: Family Medicine

## 2019-11-05 DIAGNOSIS — D500 Iron deficiency anemia secondary to blood loss (chronic): Secondary | ICD-10-CM

## 2019-11-05 DIAGNOSIS — E559 Vitamin D deficiency, unspecified: Secondary | ICD-10-CM

## 2019-11-05 MED ORDER — VITAMIN D (ERGOCALCIFEROL) 1.25 MG (50000 UNIT) PO CAPS: 50000.0000 [IU] | ORAL_CAPSULE | ORAL | 0 refills | Status: DC

## 2019-11-05 MED ORDER — FERROUS SULFATE 220 (44 FE) MG/5ML PO SOLN: 5.0000 mL | ORAL | 2 refills | Status: DC

## 2019-11-05 NOTE — Progress Notes (Signed)
Created future order for Shelby Chandler per PCP/thx dmf

## 2019-11-07 ENCOUNTER — Encounter: Payer: Self-pay | Admitting: Family Medicine

## 2019-11-07 DIAGNOSIS — D649 Anemia, unspecified: Secondary | ICD-10-CM | POA: Insufficient documentation

## 2019-12-09 ENCOUNTER — Telehealth: Payer: Self-pay | Admitting: Family Medicine

## 2019-12-09 NOTE — Telephone Encounter (Signed)
Pt is calling in needing a refill on Rx amlodipine (NORVASC) 10 MG (pt stated on her last visit with Dr. Volanda Napoleon she had increased the dosage from 5 MG to 10 MG and she would like to have the new Rx reflect the change)  Pharm:  [facility]

## 2019-12-10 ENCOUNTER — Other Ambulatory Visit: Payer: Self-pay

## 2019-12-10 MED ORDER — AMLODIPINE BESYLATE 10 MG PO TABS: 10.0000 mg | ORAL_TABLET | Freq: Every day | ORAL | 1 refills | Status: DC

## 2019-12-10 NOTE — Telephone Encounter (Signed)
Pt new Rx for amlodipine 10 mg sent to her pharmacy as requested

## 2019-12-31 ENCOUNTER — Telehealth: Payer: Self-pay | Admitting: Family Medicine

## 2019-12-31 NOTE — Telephone Encounter (Signed)
Pt form has been received and placed in Dr Volanda Napoleon folder for completing, pt will be notified when form is completed and ready for pick up

## 2019-12-31 NOTE — Telephone Encounter (Signed)
Letter of medical necessity needed and Bariatric form to be filled out- placed in dr's folder.  Fax to [phone], Attn: Bariatric Office Coordinator.  Please call pt at [phone] to let her know it has been faxed.

## 2020-01-07 ENCOUNTER — Other Ambulatory Visit: Payer: Self-pay | Admitting: Family Medicine

## 2020-01-30 ENCOUNTER — Other Ambulatory Visit: Payer: Self-pay | Admitting: Family Medicine

## 2020-01-30 DIAGNOSIS — E559 Vitamin D deficiency, unspecified: Secondary | ICD-10-CM

## 2020-02-09 ENCOUNTER — Other Ambulatory Visit: Payer: Self-pay | Admitting: Family Medicine

## 2020-02-10 NOTE — Telephone Encounter (Signed)
Pt still has 1 more refill at the pharmacy

## 2020-03-19 ENCOUNTER — Other Ambulatory Visit: Payer: Self-pay | Admitting: Family Medicine

## 2020-03-19 NOTE — Telephone Encounter (Signed)
Pt needs follow up appointment for further refills

## 2020-04-14 ENCOUNTER — Encounter: Payer: Self-pay | Admitting: Family Medicine

## 2020-04-14 ENCOUNTER — Other Ambulatory Visit: Payer: Self-pay

## 2020-04-14 ENCOUNTER — Telehealth (INDEPENDENT_AMBULATORY_CARE_PROVIDER_SITE_OTHER): Payer: Managed Care, Other (non HMO) | Admitting: Family Medicine

## 2020-04-14 VITALS — Temp 100.1°F

## 2020-04-14 DIAGNOSIS — J029 Acute pharyngitis, unspecified: Secondary | ICD-10-CM

## 2020-04-14 MED ORDER — AMOXICILLIN 875 MG PO TABS
875.0000 mg | ORAL_TABLET | Freq: Two times a day (BID) | ORAL | 0 refills | Status: DC
Start: 2020-04-14 — End: 2020-05-18

## 2020-04-14 NOTE — Progress Notes (Signed)
Patient ID: Shelby Chandler, female   DOB: 12/01/1990, 30 y.o.   MRN: [id number]   This visit type was conducted due to national recommendations for restrictions regarding the COVID-19 pandemic in an effort to limit this patient's exposure and mitigate transmission in our community.   Virtual Visit via Video Note  I connected with [name] on 04/14/20 at 10:00 AM EST by a video enabled telemedicine application and verified that I am speaking with the correct person using two identifiers.  Location patient: home Location provider:work or home office Persons participating in the virtual visit: patient, provider  I discussed the limitations of evaluation and management by telemedicine and the availability of in person appointments. The patient expressed understanding and agreed to proceed.   HPI:  Shelby Chandler called with onset Sunday of headache and sore throat and body aches.  She has also had some chills and fever up to 100.1.  She noticed some redness and swelling of her uvula and posterior pharynx region.  She had Covid test earlier today which came back negative.  She does work as a Pharmacist, hospital.  She states she has 5-6 students out now that none of those have been Covid cases.  She is taken some Advil with mild relief.  No cough.  No major nasal congestion.  No skin rashes.  She has had some tender anterior cervical nodes.  No prior known history of strep pharyngitis.  She has not seen any exudate.  No nausea or vomiting.  No diarrhea.   ROS: See pertinent positives and negatives per HPI.  Past Medical History:  Diagnosis Date  . Hypertension     No past surgical history on file.  Family History  Problem Relation Age of Onset  . Alcohol abuse Mother   . Arthritis Mother   . Depression Mother   . Drug abuse Mother   . Heart attack Mother   . Hypertension Mother   . Hyperlipidemia Mother   . Alcohol abuse Father   . Diabetes Father   . Drug abuse Father   . Hypertension  Father   . Heart attack Father   . Stroke Father   . Hypertension Sister   . Arthritis Maternal Grandmother   . Arthritis Maternal Grandfather   . Diabetes Paternal Grandmother     SOCIAL HX: Non-smoker   Current Outpatient Medications:  .  amoxicillin (AMOXIL) 875 MG tablet, Take 1 tablet (875 mg total) by mouth 2 (two) times daily., Disp: 20 tablet, Rfl: 0 .  amLODipine (NORVASC) 10 MG tablet, TAKE 1 TABLET BY MOUTH EVERY DAY, Disp: 30 tablet, Rfl: 0 .  Blood Pressure Monitoring (BLOOD PRESSURE CUFF) MISC, Use as directed for checking bp daily., Disp: 1 each, Rfl: 0 .  Ferrous Sulfate 220 (44 Fe) MG/5ML SOLN, Take 5 mLs by mouth every other day., Disp: 200 mL, Rfl: 2 .  nystatin cream (MYCOSTATIN), Apply 1 application topically 2 (two) times daily., Disp: 30 g, Rfl: 2 .  Vitamin D, Ergocalciferol, (DRISDOL) 1.25 MG (50000 UNIT) CAPS capsule, TAKE 1 CAPSULE (50,000 UNITS TOTAL) BY MOUTH EVERY 7 (SEVEN) DAYS., Disp: 12 capsule, Rfl: 0  EXAM:  VITALS per patient if applicable:  GENERAL: alert, oriented, appears well and in no acute distress  HEENT: atraumatic, conjunttiva clear, no obvious abnormalities on inspection of external nose and ears  NECK: normal movements of the head and neck  LUNGS: on inspection no signs of respiratory distress, breathing rate appears normal, no obvious gross SOB, gasping or  wheezing  CV: no obvious cyanosis  MS: moves all visible extremities without noticeable abnormality  PSYCH/NEURO: pleasant and cooperative, no obvious depression or anxiety, speech and thought processing grossly intact  ASSESSMENT AND PLAN:  Discussed the following assessment and plan:  Sore throat.  Given the fact that she has associated headache and body aches and fever and what sounds like tender anterior cervical adenopathy in the absence of cough and nasal congestion consider possibility of strep pharyngitis. -Given the above we elected to go and cover with amoxicillin  875 mg twice daily for 10 days -Plenty of fluids and rest and continue acetaminophen and/or ibuprofen for sore throat symptoms. -She will be out of work today and tomorrow to give time for some recovery and also to reduce risk of contagiousness to others that she works with -Follow-up promptly for any persistent or worsening symptoms     I discussed the assessment and treatment plan with the patient. The patient was provided an opportunity to ask questions and all were answered. The patient agreed with the plan and demonstrated an understanding of the instructions.   The patient was advised to call back or seek an in-person evaluation if the symptoms worsen or if the condition fails to improve as anticipated.     Carolann Littler, MD

## 2020-04-21 ENCOUNTER — Other Ambulatory Visit: Payer: Self-pay | Admitting: Family Medicine

## 2020-05-18 ENCOUNTER — Encounter: Payer: Self-pay | Admitting: Family Medicine

## 2020-05-18 ENCOUNTER — Ambulatory Visit: Payer: Managed Care, Other (non HMO) | Admitting: Family Medicine

## 2020-05-18 ENCOUNTER — Other Ambulatory Visit: Payer: Self-pay

## 2020-05-18 VITALS — BP 130/90 | HR 85 | Temp 98.5°F | Wt >= 6400 oz

## 2020-05-18 DIAGNOSIS — Z3A01 Less than 8 weeks gestation of pregnancy: Secondary | ICD-10-CM

## 2020-05-18 DIAGNOSIS — N926 Irregular menstruation, unspecified: Secondary | ICD-10-CM

## 2020-05-18 DIAGNOSIS — I10 Essential (primary) hypertension: Secondary | ICD-10-CM | POA: Diagnosis not present

## 2020-05-18 DIAGNOSIS — Z6841 Body Mass Index (BMI) 40.0 and over, adult: Secondary | ICD-10-CM

## 2020-05-18 LAB — POCT URINE PREGNANCY: Preg Test, Ur: POSITIVE — AB

## 2020-05-18 MED ORDER — NIFEDIPINE ER OSMOTIC RELEASE 30 MG PO TB24: 30.0000 mg | ORAL_TABLET | Freq: Every day | ORAL | 3 refills | Status: DC

## 2020-05-18 NOTE — Progress Notes (Signed)
Subjective:    Patient ID: Shelby Chandler, female    DOB: [birth year], 30 y.o.   MRN: [id number]  No chief complaint on file.   HPI Patient is a 30 year old female with past medical history significant for HTN, HSV, h/o anxiety, h/o depression, and anemia who was seen today for acute concern.  Patient endorses positive home pregnancy test.  LMP 04/13/2020.  Patient excited about being pregnant, however has several concerns.  Currently taking Norvasc for HTN.  Inquires about medications that are safe in pregnancy is also taking liquid ferrous sulfate and vitamin D3.  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  Pt has friends in the area by her family is in the [facility] area.  Past Medical History:  Diagnosis Date  . Hypertension     No Known Allergies  ROS General: Denies fever, chills, night sweats, changes in weight, changes in appetite HEENT: Denies headaches, ear pain, changes in vision, rhinorrhea, sore throat CV: Denies CP, palpitations, SOB, orthopnea Pulm: Denies SOB, cough, wheezing GI: Denies abdominal pain, nausea, vomiting, diarrhea, constipation GU: Denies dysuria, hematuria, frequency, vaginal discharge  +absent menses Msk: Denies muscle cramps, joint pains Neuro: Denies weakness, numbness, tingling Skin: Denies rashes, bruising Psych: Denies depression, anxiety, hallucinations    Objective:    Blood pressure 130/90, pulse 85, temperature 98.5 F (36.9 C), temperature source Oral, weight (!) 419 lb 6.4 oz (190.2 kg), SpO2 99 %.  Gen. Pleasant, well-nourished, in no distress, normal affect   HEENT: [place]/AT, face symmetric, conjunctiva clear, no scleral icterus, PERRLA, EOMI, nares patent without drainage Lungs: no accessory muscle use Cardiovascular: RRR, no peripheral edema Musculoskeletal: No deformities, no cyanosis or clubbing, normal tone Neuro:  A&Ox3, CN  II-XII intact, normal gait Skin:  Warm, no lesions/ rash   Wt Readings from Last 3 Encounters:  05/18/20 (!) 419 lb 6.4 oz (190.2 kg)  10/24/19 (!) 425 lb (192.8 kg)  08/07/19 (!) 426 lb (193.2 kg)    Lab Results  Component Value Date   WBC 7.1 10/24/2019   HGB 11.4 (L) 10/24/2019   HCT 36.0 10/24/2019   PLT 509 (H) 10/24/2019   GLUCOSE 81 10/24/2019   CHOL 174 01/09/2017   TRIG 72 01/09/2017   HDL 51 01/09/2017   LDLCALC 109 (H) 01/09/2017   ALT 12 07/01/2019   AST 11 07/01/2019   NA 138 10/24/2019   K 4.0 10/24/2019   CL 103 10/24/2019   CREATININE 0.81 10/24/2019   BUN 10 10/24/2019   CO2 26 10/24/2019   TSH 3.47 07/01/2019   HGBA1C 5.6 07/01/2019    Assessment/Plan:  Less than [redacted] weeks gestation of pregnancy -LMP 04/13/2020 -Positive home and in office hCG test -Patient given information to schedule appointment for OB intake with OB/GYN -Discussed starting PNV -Discussed taking half tab Unisom and B6 for nausea -Given handouts  Class 3 severe obesity due to excess calories without serious comorbidity with body mass index (BMI) of 60.0 to 69.9 in adult [facility]) -Discussed continuing lifestyle modifications and portion control -Given handout about healthy weight gain during pregnancy -Okay to continue exercising as tolerated  Missed period -Urine hCG positive - Plan: POCT urine pregnancy  Essential hypertension -Elevated -will d/c Norvasc 2/2 pregnancy -Will start Procardia XL - Plan: NIFEdipine (PROCARDIA XL) 30 MG 24 hr tablet  F/u as needed  Grier Mitts, MD

## 2020-05-18 NOTE — Patient Instructions (Addendum)
Vitamin B6 50 mg daily and a half a tab (12.5 mg) of Unisom twice a day for nausea.  Symptoms eating a bland diet also helps.  For any aches, pains, discomfort you can take Tylenol  You can take milk of magnesia if needed to treat constipation.  [facility] -Crawford Givens, MD  -Waymon Amato, MD - Everett Graff, MD -Kennis Carina, MD ---Kendall Flack, MD is listed on their site, but I'm pretty sure she retired.  [facility] -[place] -Christophe Louis, MD  -Drema Dallas, DO   Obstetrics: Normal and Problem Pregnancies (7th ed., pp. 102-121). [place], [place]: Elsevier."> Textbook of Family Medicine (9th ed., pp. [id number]). [place], [place]: Elsevier [employer].">  First Trimester of Pregnancy  The first trimester of pregnancy starts on the first day of your last menstrual period until the end of week 12. This is months 1 through 3 of pregnancy. A week after a sperm fertilizes an egg, the egg will implant into the wall of the uterus and begin to develop into a baby. By the end of 12 weeks, all the baby's organs will be formed and the baby will be 2-3 inches in size. Body changes during your first trimester Your body goes through many changes during pregnancy. The changes vary and generally return to normal after your baby is born. Physical changes  You may gain or lose weight.  Your breasts may begin to grow larger and become tender. The tissue that surrounds your nipples (areola) may become darker.  Dark spots or blotches (chloasma or mask of pregnancy) may develop on your face.  You may have changes in your hair. These can include thickening or thinning of your hair or changes in texture. Health changes  You may feel nauseous, and you may vomit.  You may have heartburn.  You may develop headaches.  You may develop constipation.  Your gums may bleed and may be sensitive to brushing and flossing. Other changes  You may tire  easily.  You may urinate more often.  Your menstrual periods will stop.  You may have a loss of appetite.  You may develop cravings for certain kinds of food.  You may have changes in your emotions from day to day.  You may have more vivid and strange dreams. Follow these instructions at home: Medicines  Follow your health care provider's instructions regarding medicine use. Specific medicines may be either safe or unsafe to take during pregnancy. Do not take any medicines unless told to by your health care provider.  Take a prenatal vitamin that contains at least 600 micrograms (mcg) of folic acid. Eating and drinking  Eat a healthy diet that includes fresh fruits and vegetables, whole grains, good sources of protein such as meat, eggs, or tofu, and low-fat dairy products.  Avoid raw meat and unpasteurized juice, milk, and cheese. These carry germs that can harm you and your baby.  If you feel nauseous or you vomit: ? Eat 4 or 5 small meals a day instead of 3 large meals. ? Try eating a few soda crackers. ? Drink liquids between meals instead of during meals.  You may need to take these actions to prevent or treat constipation: ? Drink enough fluid to keep your urine pale yellow. ? Eat foods that are high in fiber, such as beans, whole grains, and fresh fruits and vegetables. ? Limit foods that are high in fat and processed sugars, such as fried or sweet foods. Activity  Exercise only as directed by your health care provider. Most people can continue their usual exercise routine during pregnancy. Try to exercise for 30 minutes at least 5 days a week.  Stop exercising if you develop pain or cramping in the lower abdomen or lower back.  Avoid exercising if it is very hot or humid or if you are at high altitude.  Avoid heavy lifting.  If you choose to, you may have sex unless your health care provider tells you not to. Relieving pain and discomfort  Wear a good support  bra to relieve breast tenderness.  Rest with your legs elevated if you have leg cramps or low back pain.  If you develop bulging veins (varicose veins) in your legs: ? Wear support hose as told by your health care provider. ? Elevate your feet for 15 minutes, 3-4 times a day. ? Limit salt in your diet. Safety  Wear your seat belt at all times when driving or riding in a car.  Talk with your health care provider if someone is verbally or physically abusive to you.  Talk with your health care provider if you are feeling sad or have thoughts of hurting yourself. Lifestyle  Do not use hot tubs, steam rooms, or saunas.  Do not douche. Do not use tampons or scented sanitary pads.  Do not use herbal remedies, alcohol, illegal drugs, or medicines that are not approved by your health care provider. Chemicals in these products can harm your baby.  Do not use any products that contain nicotine or tobacco, such as cigarettes, e-cigarettes, and chewing tobacco. If you need help quitting, ask your health care provider.  Avoid cat litter boxes and soil used by cats. These carry germs that can cause birth defects in the baby and possibly loss of the unborn baby (fetus) by miscarriage or stillbirth. General instructions  During routine prenatal visits in the first trimester, your health care provider will do a physical exam, perform necessary tests, and ask you how things are going. Keep all follow-up visits. This is important.  Ask for help if you have counseling or nutritional needs during pregnancy. Your health care provider can offer advice or refer you to specialists for help with various needs.  Schedule a dentist appointment. At home, brush your teeth with a soft toothbrush. Floss gently.  Write down your questions. Take them to your prenatal visits. Where to find more information  American Pregnancy Association: americanpregnancy.[place] and Gynecologists:  PoolDevices.com.pt  Office on [employer]: KeywordPortfolios.com.br Contact a health care provider if you have:  Dizziness.  A fever.  Mild pelvic cramps, pelvic pressure, or nagging pain in the abdominal area.  Nausea, vomiting, or diarrhea that lasts for 24 hours or longer.  A bad-smelling vaginal discharge.  Pain when you urinate.  Known exposure to a contagious illness, such as chickenpox, measles, Zika virus, HIV, or hepatitis. Get help right away if you have:  Spotting or bleeding from your vagina.  Severe abdominal cramping or pain.  Shortness of breath or chest pain.  Any kind of trauma, such as from a fall or a car crash.  New or increased pain, swelling, or redness in an arm or leg. Summary  The first trimester of pregnancy starts on the first day of your last menstrual period until the end of week 12 (months 1 through 3).  Eating 4 or 5 small meals a day rather than 3 large meals may help to relieve nausea  and vomiting.  Do not use any products that contain nicotine or tobacco, such as cigarettes, e-cigarettes, and chewing tobacco. If you need help quitting, ask your health care provider.  Keep all follow-up visits. This is important. This information is not intended to replace advice given to you by your health care provider. Make sure you discuss any questions you have with your health care provider. Document Revised: 07/03/2019 Document Reviewed: 05/09/2019 Elsevier Patient Education  2021 [employer] Gain During Pregnancy A certain amount of weight gain during pregnancy is normal and healthy. The amount of weight you should gain during pregnancy depends on your overall health and your weight before you became pregnant. Talk with your health care provider to find out how much weight you should gain during your pregnancy. General guidelines for a healthy total weight gain during pregnancy are based on your body mass  index (BMI) and are listed below. If your BMI at or before the start of your pregnancy is:  Less than 18.5 (underweight), you should gain 28-40 lb (13-18 kg).  18.5-24.9 (normal weight), you should gain 25-35 lb (11-16 kg).  25-29.9 (overweight), you should gain 15-25 lb (7-11 kg).  30 or higher (obese), you should gain 11-20 lb (5-9 kg). Your health care provider may recommend that you:  Gain more weight, if you were underweight before pregnancy or if you are pregnant with more than one baby.  Gain less weight, if you were overweight before pregnancy or if you are gaining too much weight during your pregnancy. How does unhealthy weight gain affect me? Gaining too much weight during pregnancy can lead to pregnancy complications, such as:  A temporary form of diabetes that develops during pregnancy (gestational diabetes).  Hypertensive disorders of pregnancy (preeclampsia or gestational hypertension).  Raising your risk of having a more difficult delivery or a surgical delivery (cesarean delivery, or C-section). How does unhealthy weight gain affect my baby? Not gaining enough weight can be life-threatening for your baby. It may also raise these risks for your baby:  Being born early (preterm).  Being smaller than normal during pregnancy or not growing normally (intrauterine growth restriction).  Having a low weight at birth. Gaining too much weight may raise these risks for your baby:  Growing larger than normal during pregnancy (large for gestational age or macrosomia).  Increased risk of obesity. What actions can I take to gain a healthy amount of weight during pregnancy? Nutrition  Eat healthy foods. Every day, try to eat: ? Fruits and vegetables. Include a variety of colors and types, like sweet potatoes, oranges, apples, bell peppers, beets, berries, squash, and broccoli. ? Whole grains, such as millet, barley, whole-wheat breads and cereals, and oatmeal. ? Low-fat dairy  products, like yogurt, milk, and cheese. You can also include non-dairy choices, like almond milk or rice milk. ? Protein-rich foods, like lean meat, chicken, eggs, peas, and beans.  Avoid foods that are fried or have a lot of fat, salt (sodium), or sugar.  Drink enough fluid to keep your urine pale yellow.  Choose healthy snack and drink options when you are away from home: ? Drink water. Avoid soda, sports drinks, and juices that have added sugar. ? Avoid drinks with caffeine, such as coffee and energy drinks. ? Eat snacks that are high in protein, such as nuts, protein bars, and low-fat yogurt. ? Carry convenient snacks with you that do not need refrigeration, such as a pack of trail mix, an apple, or  a granola bar.  If you need help improving your diet, work with a health care provider or a dietitian.   Activity  Exercise regularly, as told by your health care provider. ? If you were active before becoming pregnant, you may be able to continue your regular fitness activities. ? If you were not active before pregnancy, you may gradually build up to exercising for 30 or more minutes on most days of the week. This may include walking, swimming, or yoga.  Ask your health care provider what activities are safe for you. Talk with your health care provider about whether you may need to be excused from certain school or work activities.   Follow these instructions at home:  Take over-the-counter and prescription medicines only as told by your health care provider.  Take all prenatal supplements as told by your health care provider.  Keep track of your weight gain during pregnancy.  Keep all health care visits during pregnancy (prenatal visits). These visits are a good time to discuss your weight gain. Your health care provider will weigh you at each visit to make sure you are gaining a healthy amount of weight. Where to find support Some pregnant women and teens face unique challenges and  need extra support. If you have questions or need help gaining a healthy amount of weight during pregnancy, these people may help:  Your health care provider.  A dietitian.  Your school nurse.  Your family and friends.  Local counseling centers, church groups, or clinics that have services for teens. Where to find more information Learn more about managing your weight gain during pregnancy from:  American Pregnancy Association: americanpregnancy.org  U.S. Department of Agriculture pregnancy weight gain calculator: http://www.wilson-mendoza.org/ Contact a health care provider if:  You are unable to eat or drink for longer than 24 hours.  You cannot afford food or have trouble accessing regular meals. Get help right away if you: Summary  Talk with your health care provider to find out how much weight you should gain during your pregnancy.  Too much or too little weight gain during pregnancy can lead to complications for you and your baby.  Eat healthy foods like fruits and vegetables, whole grains, low-fat dairy products, and protein-rich foods.  Ask your health care provider what activities are safe for you.  Keep all of your prenatal visits. This information is not intended to replace advice given to you by your health care provider. Make sure you discuss any questions you have with your health care provider. Document Revised: 08/22/2019 Document Reviewed: 08/22/2019 Elsevier Patient Education  2021 [place].  Morning Sickness  Morning sickness is when a woman feels nauseous during pregnancy. This nauseous feeling may or may not come with vomiting. It often occurs in the morning, but it can be a problem at any time of day. Morning sickness is most common during the first trimester. In some cases, it may continue throughout pregnancy. Although morning sickness is unpleasant, it is usually harmless unless the woman develops severe and continual vomiting (hyperemesis gravidarum), a condition  that requires more intense treatment. What are the causes? The exact cause of this condition is not known, but it seems to be related to normal hormonal changes that occur in pregnancy. What increases the risk? You are more likely to develop this condition if:  You experienced nausea or vomiting before your pregnancy.  You had morning sickness during a previous pregnancy.  You are pregnant with more than one baby,  such as twins. What are the signs or symptoms? Symptoms of this condition include:  Nausea.  Vomiting. How is this diagnosed? This condition is usually diagnosed based on your signs and symptoms. How is this treated? In many cases, treatment is not needed for this condition. Making some changes to what you eat may help to control symptoms. Your health care provider may also prescribe or recommend:  Vitamin B6 supplements.  Anti-nausea medicines.  Ginger. Follow these instructions at home: Medicines  Take over-the-counter and prescription medicines only as told by your health care provider. Do not use any prescription, over-the-counter, or herbal medicines for morning sickness without first talking with your health care provider.  Take multivitamins before getting pregnant. This can prevent or decrease the severity of morning sickness in most women. Eating and drinking  Eat a piece of dry toast or crackers before getting out of bed in the morning.  Eat 5 or 6 small meals a day.  Eat dry and bland foods, such as rice or a baked potato. Foods that are high in carbohydrates are often helpful.  Avoid greasy, fatty, and spicy foods.  Have someone cook for you if the smell of any food causes nausea and vomiting.  If you feel nauseous after taking prenatal vitamins, take the vitamins at night or with a snack.  Eat a protein snack between meals if you are hungry. Nuts, yogurt, and cheese are good options.  Drink fluids throughout the day.  Try ginger ale made with  real ginger, ginger tea made from fresh grated ginger, or ginger candies. General instructions  Do not use any products that contain nicotine or tobacco. These products include cigarettes, chewing tobacco, and vaping devices, such as e-cigarettes. If you need help quitting, ask your health care provider.  Get an air purifier to keep the air in your house free of odors.  Get plenty of fresh air.  Try to avoid odors that trigger your nausea.  Consider trying these methods to help relieve symptoms: ? Wearing an acupressure wristband. These wristbands are often worn for seasickness. ? Acupuncture. Contact a health care provider if:  Your home remedies are not working and you need medicine.  You feel dizzy or light-headed.  You are losing weight. Get help right away if:  You have persistent and uncontrolled nausea and vomiting.  You faint.  You have severe pain in your abdomen. Summary  Morning sickness is when a woman feels nauseous during pregnancy. This nauseous feeling may or may not come with vomiting.  Morning sickness is most common during the first trimester.  It often occurs in the morning, but it can be a problem at any time of day.  In many cases, treatment is not needed for this condition. Making some changes to what you eat may help to control symptoms. This information is not intended to replace advice given to you by your health care provider. Make sure you discuss any questions you have with your health care provider. Document Revised: 09/09/2019 Document Reviewed: 08/19/2019 Elsevier Patient Education  2021 [place] During Pregnancy It is normal to feel stress sometimes. Pregnancy can be an especially stressful time because of the changes in your body and the preparation involved in becoming a parent. Although it is normal to feel some stress from time to time, feeling a lot of stress over time can cause short-term and long-term health  problems. Managing your stress is an important part of having a healthy pregnancy  and a healthy baby. Healthy management of stress lowers your risk of problems and allows you to be better prepared to care for your baby. How does stress during pregnancy affect me? Stress during pregnancy can cause some problems, including:  Trouble sleeping.  Sadness or depression before or after your baby is born.  Eating less or more than you should.  Wanting to eat unhealthy foods.  Wanting to use drugs or alcohol. Stress can also cause physical symptoms, such as:  Sore, tense muscles, especially in the shoulders and neck.  Headaches.  Difficulty breathing.  A faster heart rate or an increase in blood pressure.  Stomach pain, nausea, or vomiting.  Diarrhea or constipation. How does stress during pregnancy affect my baby? The changes in your body caused by stress can affect your growing baby. If you have a lot of stress:  Your baby may be born early (prematurely).  Your baby may have a low birth weight.  Your baby may have anxiety or learning problems during childhood. What increases the risk of stress during pregnancy? You may be at greater risk for stress-related problems during pregnancy if:  You have an unplanned pregnancy.  You have chronic physical or mental health problems.  You do not have support from friends or family.  You have other major life changes at the same time, such as a job change or a move.  You are in an abusive relationship, or you are regularly exposed to violence.  You live in poverty. What actions can I take to manage stress? Lifestyle  Follow a healthy diet. Avoid foods that are high in sugar or fat.  Do not drink alcohol or use drugs.  Do not take medicines that have not been prescribed to you by your health care provider.  Do not use any products that contain nicotine or tobacco. These products include cigarettes, chewing tobacco, and vaping  devices, such as e-cigarettes. If you need help quitting, ask your health care provider.  Get at least 8 hours of sleep each night. Activity  Walk or exercise every day. Ask your health care provider what activities are safe for you. Moderate physical activity can be a safe and effective way to relieve stress during pregnancy.  Practice relaxation techniques like meditation and deep breathing.  Spend time doing relaxing activities that you enjoy, like listening to music, spending time with friends, or reading a book.   General information  Try to know the things that make you feel stressed. Avoid them when possible.  Ask for help from friends or family members when you are feeling stressed or overwhelmed.  Keep all follow-up visits. This is important.   Where to find more information  March of Dimes: marchofdimes.org  Office on [employer]: LegalWarrants.gl  Centers for Disease Control and Prevention: StoreMirror.com.cy Contact a health care provider if:  You have symptoms of stress that do not get better or get worse.  You need help quitting smoking, drugs, or alcohol.  You do not feel safe at home.  You need help getting basic needs such as food, clothing, or shelter. Get help right away if:  You have signs of pregnancy complications. These include: ? Bleeding from your vagina. ? Swollen hands and feet. ? Changes in your vision. ? Headaches that are extremely painful or last a long time.  You think you might be going into labor early. These signs include: ? Back pain. ? Abdominal cramping. ? A feeling of squeezing or tightening in your  abdomen. These may be contractions. ? Fluid coming from your vagina. ? Pressure in your pelvis. Summary  Stress over time can cause short-term and long-term health problems.  Stress during pregnancy can cause problems for your baby before and after birth.  Take steps to manage your stress, such as getting plenty of sleep, exercising, and  practicing relaxation techniques.  Get help right away if you have signs of pregnancy complications or premature labor. This information is not intended to replace advice given to you by your health care provider. Make sure you discuss any questions you have with your health care provider. Document Revised: 10/21/2019 Document Reviewed: 10/21/2019 Elsevier Patient Education  2021 [employer].

## 2020-06-03 LAB — OB RESULTS CONSOLE ABO/RH: RH Type: NEGATIVE

## 2020-06-03 LAB — OB RESULTS CONSOLE HEPATITIS B SURFACE ANTIGEN: Hepatitis B Surface Ag: NEGATIVE

## 2020-06-08 ENCOUNTER — Other Ambulatory Visit: Payer: Self-pay

## 2020-06-08 ENCOUNTER — Ambulatory Visit: Payer: Managed Care, Other (non HMO) | Admitting: Family Medicine

## 2020-06-08 ENCOUNTER — Encounter: Payer: Self-pay | Admitting: Family Medicine

## 2020-06-08 VITALS — HR 82 | Temp 98.2°F | Wt >= 6400 oz

## 2020-06-08 DIAGNOSIS — K05219 Aggressive periodontitis, localized, unspecified severity: Secondary | ICD-10-CM | POA: Diagnosis not present

## 2020-06-08 MED ORDER — PENICILLIN V POTASSIUM 500 MG PO TABS: 500.0000 mg | ORAL_TABLET | Freq: Four times a day (QID) | ORAL | 0 refills | Status: DC

## 2020-06-08 NOTE — Progress Notes (Signed)
Established Patient Office Visit  Subjective:  Patient ID: Shelby Chandler, female    DOB: 1990-05-15  Age: 30 y.o. MRN: [id number]  CC:  Chief Complaint  Patient presents with  . Dental Pain    Has an abscess, x 3 days, causing bad breath, tooth does not hurt but the neck is sore    HPI [name] presents for possible gum abscess.  She states about 2 months ago she had right upper molar removed and she noticed about 3 days ago some swelling and pain in her right side of her neck and little bit of pain in her upper gum.  She looked back and saw some redness and swelling.  She did discover that she is pregnant recently and she is currently about 8 weeks.  She saw her gynecologist earlier today.  She denies any fevers or chills.  No known drug allergies.  She states her dentist is out of town and she would not be able to see them until next week.  Past Medical History:  Diagnosis Date  . Hypertension     No past surgical history on file.  Family History  Problem Relation Age of Onset  . Alcohol abuse Mother   . Arthritis Mother   . Depression Mother   . Drug abuse Mother   . Heart attack Mother   . Hypertension Mother   . Hyperlipidemia Mother   . Alcohol abuse Father   . Diabetes Father   . Drug abuse Father   . Hypertension Father   . Heart attack Father   . Stroke Father   . Hypertension Sister   . Arthritis Maternal Grandmother   . Arthritis Maternal Grandfather   . Diabetes Paternal Grandmother     Social History   Socioeconomic History  . Marital status: Single    Spouse name: Not on file  . Number of children: Not on file  . Years of education: Not on file  . Highest education level: Not on file  Occupational History  . Not on file  Tobacco Use  . Smoking status: Never Smoker  . Smokeless tobacco: Never Used  Substance and Sexual Activity  . Alcohol use: Yes    Comment: social  . Drug use: No  . Sexual activity: Not Currently     Birth control/protection: None  Other Topics Concern  . Not on file  Social History Narrative  . Not on file   Social Determinants of Health   Financial Resource Strain: Not on file  Food Insecurity: Not on file  Transportation Needs: Not on file  Physical Activity: Not on file  Stress: Not on file  Social Connections: Not on file  Intimate Partner Violence: Not on file    Outpatient Medications Prior to Visit  Medication Sig Dispense Refill  . Blood Pressure Monitoring (BLOOD PRESSURE CUFF) MISC Use as directed for checking bp daily. 1 each 0  . Ferrous Sulfate 220 (44 Fe) MG/5ML SOLN Take 5 mLs by mouth every other day. 200 mL 2  . NIFEdipine (PROCARDIA XL) 30 MG 24 hr tablet Take 1 tablet (30 mg total) by mouth daily. 30 tablet 3  . Vitamin D, Ergocalciferol, (DRISDOL) 1.25 MG (50000 UNIT) CAPS capsule TAKE 1 CAPSULE (50,000 UNITS TOTAL) BY MOUTH EVERY 7 (SEVEN) DAYS. 12 capsule 0   No facility-administered medications prior to visit.    No Known Allergies  ROS Review of Systems  Constitutional: Negative for chills and fever.  HENT: Negative  for trouble swallowing.   Respiratory: Negative for cough and shortness of breath.       Objective:    Physical Exam Vitals reviewed.  HENT:     Mouth/Throat:     Comments: She has area of localized swelling right upper gum near the site of recent tooth excision.  No purulence expressed.  Minimally tender. Cardiovascular:     Rate and Rhythm: Normal rate and regular rhythm.  Pulmonary:     Effort: Pulmonary effort is normal.     Breath sounds: Normal breath sounds.  Musculoskeletal:     Cervical back: Normal range of motion and neck supple.  Neurological:     Mental Status: She is alert.     Pulse 82   Temp 98.2 F (36.8 C) (Oral)   Wt (!) 428 lb 6.4 oz (194.3 kg)   LMP 05/07/2020 (Exact Date)   SpO2 99%   BMI 71.29 kg/m  Wt Readings from Last 3 Encounters:  06/08/20 (!) 428 lb 6.4 oz (194.3 kg)  05/18/20 (!)  419 lb 6.4 oz (190.2 kg)  10/24/19 (!) 425 lb (192.8 kg)     Health Maintenance Due  Topic Date Due  . PAP SMEAR-Modifier  12/20/2019    There are no preventive care reminders to display for this patient.  Lab Results  Component Value Date   TSH 3.47 07/01/2019   Lab Results  Component Value Date   WBC 7.1 10/24/2019   HGB 11.4 (L) 10/24/2019   HCT 36.0 10/24/2019   MCV 72.6 (L) 10/24/2019   PLT 509 (H) 10/24/2019   Lab Results  Component Value Date   NA 138 10/24/2019   K 4.0 10/24/2019   CO2 26 10/24/2019   GLUCOSE 81 10/24/2019   BUN 10 10/24/2019   CREATININE 0.81 10/24/2019   BILITOT 0.2 07/01/2019   ALKPHOS 53 07/01/2019   AST 11 07/01/2019   ALT 12 07/01/2019   PROT 7.2 07/01/2019   ALBUMIN 4.0 07/01/2019   CALCIUM 9.3 10/24/2019   GFR 106.61 07/01/2019   Lab Results  Component Value Date   CHOL 174 01/09/2017   Lab Results  Component Value Date   HDL 51 01/09/2017   Lab Results  Component Value Date   LDLCALC 109 (H) 01/09/2017   Lab Results  Component Value Date   TRIG 72 01/09/2017   Lab Results  Component Value Date   CHOLHDL 3.4 01/09/2017   Lab Results  Component Value Date   HGBA1C 5.6 07/01/2019      Assessment & Plan:   Possible early gum abscess right upper gum.  She does have some redness and swelling in this region.  Recent tooth extraction.  She is reportedly [redacted] weeks pregnant  -Follow-up with dentist as soon as possible -We agreed to send in penicillin V 500 mg 4 times daily for 7 days until she can get into see dentist  Meds ordered this encounter  Medications  . penicillin v potassium (VEETID) 500 MG tablet    Sig: Take 1 tablet (500 mg total) by mouth 4 (four) times daily.    Dispense:  28 tablet    Refill:  0    Follow-up: No follow-ups on file.    Carolann Littler, MD

## 2020-06-08 NOTE — Patient Instructions (Signed)
Follow up with dentist as soon as possible.

## 2020-06-12 LAB — OB RESULTS CONSOLE GC/CHLAMYDIA
Chlamydia: NEGATIVE
Gonorrhea: NEGATIVE

## 2020-06-12 LAB — OB RESULTS CONSOLE RUBELLA ANTIBODY, IGM: Rubella: IMMUNE

## 2020-06-12 LAB — OB RESULTS CONSOLE HIV ANTIBODY (ROUTINE TESTING): HIV: NONREACTIVE

## 2020-06-15 ENCOUNTER — Other Ambulatory Visit: Payer: Self-pay | Admitting: Obstetrics and Gynecology

## 2020-06-15 DIAGNOSIS — O10919 Unspecified pre-existing hypertension complicating pregnancy, unspecified trimester: Secondary | ICD-10-CM

## 2020-06-15 DIAGNOSIS — Z363 Encounter for antenatal screening for malformations: Secondary | ICD-10-CM

## 2020-07-15 ENCOUNTER — Encounter: Payer: Managed Care, Other (non HMO) | Admitting: Family Medicine

## 2020-07-17 ENCOUNTER — Ambulatory Visit (INDEPENDENT_AMBULATORY_CARE_PROVIDER_SITE_OTHER): Payer: Managed Care, Other (non HMO) | Admitting: Nurse Practitioner

## 2020-07-17 ENCOUNTER — Encounter: Payer: Self-pay | Admitting: Nurse Practitioner

## 2020-07-17 ENCOUNTER — Other Ambulatory Visit: Payer: Self-pay

## 2020-07-17 VITALS — BP 171/108 | HR 80

## 2020-07-17 DIAGNOSIS — O210 Mild hyperemesis gravidarum: Secondary | ICD-10-CM

## 2020-07-17 DIAGNOSIS — Z8619 Personal history of other infectious and parasitic diseases: Secondary | ICD-10-CM

## 2020-07-17 DIAGNOSIS — O0990 Supervision of high risk pregnancy, unspecified, unspecified trimester: Secondary | ICD-10-CM | POA: Insufficient documentation

## 2020-07-17 DIAGNOSIS — Z6841 Body Mass Index (BMI) 40.0 and over, adult: Secondary | ICD-10-CM

## 2020-07-17 DIAGNOSIS — O10919 Unspecified pre-existing hypertension complicating pregnancy, unspecified trimester: Secondary | ICD-10-CM

## 2020-07-17 DIAGNOSIS — Z3A13 13 weeks gestation of pregnancy: Secondary | ICD-10-CM

## 2020-07-17 MED ORDER — PROCARDIA XL 60 MG PO TB24
ORAL_TABLET | ORAL | 1 refills | Status: DC
Start: 2020-07-17 — End: 2020-08-14

## 2020-07-17 NOTE — Progress Notes (Signed)
Subjective:   Shelby Chandler is a 30 y.o. G1P0 at [redacted]w[redacted]d by LMP being seen today for her first obstetrical visit.  Her obstetrical history is significant for  chronic hypertension on medication, BMI 69 prepregnancy . Patient does intend to breast feed. Pregnancy history fully reviewed.  Patient reports  occasional nausea .  HISTORY: OB History  Gravida Para Term Preterm AB Living  1 0 0 0 0 0  SAB IAB Ectopic Multiple Live Births  0 0 0 0 0    # Outcome Date GA Lbr Len/2nd Weight Sex Delivery Anes PTL Lv  1 Current            Past Medical History:  Diagnosis Date   Hypertension    No past surgical history on file. Family History  Problem Relation Age of Onset   Alcohol abuse Mother    Arthritis Mother    Depression Mother    Drug abuse Mother    Heart attack Mother    Hypertension Mother    Hyperlipidemia Mother    Alcohol abuse Father    Diabetes Father    Drug abuse Father    Hypertension Father    Heart attack Father    Stroke Father    Hypertension Sister    Arthritis Maternal Grandmother    Arthritis Maternal Grandfather    Diabetes Paternal Grandmother    Social History   Tobacco Use   Smoking status: Never   Smokeless tobacco: Never  Substance Use Topics   Alcohol use: Yes    Comment: social   Drug use: No   No Known Allergies Current Outpatient Medications on File Prior to Visit  Medication Sig Dispense Refill   Blood Pressure Monitoring (BLOOD PRESSURE CUFF) MISC Use as directed for checking bp daily. 1 each 0   Ferrous Sulfate 220 (44 Fe) MG/5ML SOLN Take 5 mLs by mouth every other day. 200 mL 2   NIFEdipine (PROCARDIA XL) 30 MG 24 hr tablet Take 1 tablet (30 mg total) by mouth daily. 30 tablet 3   Prenatal MV-Min-Fe Fum-FA-DHA (PRENATAL 1 PO) Take by mouth. gummies     Vitamin D, Ergocalciferol, (DRISDOL) 1.25 MG (50000 UNIT) CAPS capsule TAKE 1 CAPSULE (50,000 UNITS TOTAL) BY MOUTH EVERY 7 (SEVEN) DAYS. 12 capsule 0   penicillin v  potassium (VEETID) 500 MG tablet Take 1 tablet (500 mg total) by mouth 4 (four) times daily. 28 tablet 0   No current facility-administered medications on file prior to visit.     Exam   Vitals:   07/17/20 1133 07/17/20 1145  BP: (!) 177/107 (!) 171/108  Pulse: 80   Unsure if weight done at today's visit - priority was evaluating BP and transferring labs from previous OB visit    Uterus:     Pelvic Exam: Perineum: deferred   Vulva:    Vagina:     Cervix:    Adnexa:    Bony Pelvis:   System: General: well-developed, ovese female in no acute distress   Breast:  deferred   Skin: normal coloration and turgor, no rashes   Neurologic: oriented, normal, negative, normal mood   Extremities: normal strength, tone, and muscle mass, ROM of all joints is normal   HEENT extraocular movement intact and sclera clear, anicteric   Mouth/Teeth deferred   Neck supple    Cardiovascular: regular rate and rhythm   Respiratory:  no respiratory distress, normal breath sounds   Abdomen: soft, non-tender; no masses,  no organomegaly     Assessment:   Pregnancy: G1P0 Patient Active Problem List   Diagnosis Date Noted   Less than [redacted] weeks gestation of pregnancy 05/18/2020   Absolute anemia 11/07/2019   Vitamin B12 deficiency 07/04/2019   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 Supervision of high risk pregnancy, antepartum Transfer in from Dr. Sundra Aland office - had one visit there Taking Vitamin B6 for nausea and it is helping Nausea is getting better BP elevated on initial BP today.  Patient brought  cuff from home to check with our cuff.  Her cuff (normal size cuff) barely meets going around her upper arm and that is usually how she has been using it.  Needs a better home cuff for more accurate readings. We did several readings today: - her home machine with normal size cuff- reading on upper arm was normal but that was not an accurate reading - her home machine with normal cuff on lower arm and reading was 183/114 - our BP machine with large cuff on upper arm - error message with no reading - our BP machine with thigh cuff on upper arm - error message with no reading -our BP machine with large cuff on lower arm - 177/107 and 171/108 Reviewed records and BP labs drawn in early May with CPM normal and P/Cr ration was 59 - will repeat today. Will still need genetic screening drawn at a future visit Visualized FHT on hand held bedside US screen. Reviewed previous prenatal records and of note - patient does not want her partner in all visits as her interaction with him is stressful especially about her weight. In today's visit, did not inquire about previous history of depression as seen on problem list- will need referral to in house behavioral health given issues with partner and previous history of depression.  - Protein / creatinine ratio, urine - Comprehensive metabolic panel - CBC  2. Chronic hypertension affecting pregnancy Currently on Procardia EX 30 mg daily - took her medication today and has not symptoms of high blood pressure - usually gets a headache if her BP is elevated. But most accurate BP with the office cuff was 171/108 [facility] labs  Consult with Dr. Elgie Congo on medication and timing of her next visit.  Medicine changed to Procardia 60 XL BID.  Called patient and verified 2 identifiers.  Discussed the changes to her medication and getting a large adult cuff for her BP machine - plan to take BP at home in lower arm.  Bring any new cuff with you to your visit on Tuesday.  3.  History of herpes simplex type 2 infection Will treat at 35 weeks  4. BMI 60.0-69.9, adult [facility])    Initial labs drawn at [facility]. Continue prenatal vitamins. Genetic Screening discussed,  will draw later in pregnancy :  Ultrasound discussed; fetal anatomic survey:  early ultrasound has been scheduled . Problem list reviewed and updated. The nature of [place] with multiple MDs and other Advanced Practice Providers was explained to patient; also emphasized  that residents, students are part of our team. Routine obstetric precautions reviewed. Return in about 1 week (around 07/24/2020) for  in person ROB with MD.  Total face-to-face time with patient: 40 minutes.  Over 50% of encounter was spent on counseling and coordination of care.     Earlie Server, FNP Family Nurse Practitioner, [facility] for [employer], [employer] 07/17/2020 12:10 PM

## 2020-07-18 ENCOUNTER — Emergency Department (HOSPITAL_BASED_OUTPATIENT_CLINIC_OR_DEPARTMENT_OTHER)
Admission: EM | Admit: 2020-07-18 | Discharge: 2020-07-18 | Disposition: A | Discharge status: Home / Self Care | Payer: Managed Care, Other (non HMO) | Attending: Emergency Medicine | Admitting: Emergency Medicine

## 2020-07-18 ENCOUNTER — Encounter (HOSPITAL_BASED_OUTPATIENT_CLINIC_OR_DEPARTMENT_OTHER): Payer: Self-pay | Admitting: Emergency Medicine

## 2020-07-18 DIAGNOSIS — S7012XA Contusion of left thigh, initial encounter: Secondary | ICD-10-CM | POA: Insufficient documentation

## 2020-07-18 DIAGNOSIS — W19XXXA Unspecified fall, initial encounter: Secondary | ICD-10-CM

## 2020-07-18 DIAGNOSIS — Z79899 Other long term (current) drug therapy: Secondary | ICD-10-CM | POA: Diagnosis not present

## 2020-07-18 DIAGNOSIS — O10011 Pre-existing essential hypertension complicating pregnancy, first trimester: Secondary | ICD-10-CM | POA: Insufficient documentation

## 2020-07-18 DIAGNOSIS — W182XXA Fall in (into) shower or empty bathtub, initial encounter: Secondary | ICD-10-CM | POA: Diagnosis not present

## 2020-07-18 DIAGNOSIS — I10 Essential (primary) hypertension: Secondary | ICD-10-CM

## 2020-07-18 DIAGNOSIS — O9A211 Injury, poisoning and certain other consequences of external causes complicating pregnancy, first trimester: Secondary | ICD-10-CM | POA: Diagnosis present

## 2020-07-18 DIAGNOSIS — Z3A13 13 weeks gestation of pregnancy: Secondary | ICD-10-CM | POA: Diagnosis not present

## 2020-07-18 NOTE — ED Notes (Signed)
Fetal heart tones via hand held ultrasound yielded 132 beats per minute.

## 2020-07-18 NOTE — ED Triage Notes (Signed)
Pt is [redacted] weeks pregnant,fell out of the tub yesterday. Today is having vaginal pressure, denies spotting. Blood pressure is elevated (pt has had blood pressure prior to pregnancy) and MD adjusted meds yesterday.

## 2020-07-18 NOTE — Discharge Instructions (Addendum)
Follow back up with your OB/GYN doctor regarding follow-up of the blood pressure.  Today's fetal heart tones are in the 130 range.  Very strong.  No evidence of any fetal injury.  The bruise to your left thigh will probably hurt for a couple weeks.  Follow-up with your primary care doctor for any new or worse symptoms related there.  Return for any vaginal bleeding.

## 2020-07-18 NOTE — ED Provider Notes (Signed)
[place] EMERGENCY DEPT Provider Note   CSN: [id number] Arrival date & time: 07/18/20  6834     History Chief Complaint  Patient presents with   Shelby Chandler is a 30 y.o. female.  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   Anxiety and depression 08/28/2017   Decreased energy 08/28/2017   Chronic low back pain without sciatica 08/28/2017   History of anemia 08/28/2017   Essential hypertension 01/09/2017   Iron deficiency anemia due to chronic blood loss 01/09/2017   Class 3 severe obesity due to excess calories without serious comorbidity with body mass index (BMI) of 60.0 to 69.9 in adult [facility]) 01/09/2017    Past Surgical History:  Procedure Laterality Date   TOOTH EXTRACTION       OB History     Gravida  1   Para  0   Term  0   Preterm      AB  0   Living         SAB  0   IAB  0   Ectopic  0   Multiple  0   Live Births              Family History  Problem Relation Age of Onset   Alcohol abuse Mother    Arthritis Mother    Depression Mother    Drug abuse Mother    Heart attack Mother    Hypertension Mother    Hyperlipidemia Mother    Alcohol abuse Father    Diabetes Father    Drug abuse Father    Hypertension Father    Heart attack Father    Stroke Father    Hypertension Sister    Arthritis Maternal Grandmother    Arthritis Maternal Grandfather    Diabetes Paternal Grandmother     Social History   Tobacco Use   Smoking status: Never   Smokeless tobacco: Never  Substance Use Topics   Alcohol use: Not Currently   Drug use: No    Home Medications Prior to Admission medications   Medication Sig Start  Date End Date Taking? Authorizing Provider  Blood Pressure Monitoring (BLOOD PRESSURE CUFF) MISC Use as directed for checking bp daily. 07/19/19  Yes Billie Ruddy, MD  Ferrous Sulfate 220 (44 Fe) MG/5ML SOLN Take 5 mLs by mouth every other day. 11/05/19  Yes Billie Ruddy, MD  Prenatal MV-Min-Fe Fum-FA-DHA (PRENATAL 1 PO) Take by mouth. gummies   Yes [provider]  PROCARDIA XL 60 MG 24 hr tablet Take one tablet every 12 hours - twice daily 07/17/20  Yes Burleson, Terri L, NP  Vitamin D, Ergocalciferol, (DRISDOL) 1.25 MG (50000 UNIT) CAPS capsule TAKE 1 CAPSULE (50,000 UNITS TOTAL) BY MOUTH EVERY 7 (SEVEN) DAYS. 02/03/20  Yes Billie Ruddy, MD    Allergies    Patient has no known allergies.  Review of Systems   Review of Systems  Physical Exam Updated Vital Signs BP (!) 190/107   Pulse 87   Temp 98.2 F (36.8 C) (Oral)   Resp 16   LMP 04/13/2020 (Exact Date)   SpO2 100%  Physical Exam  ED Results / Procedures / Treatments   Labs (all labs ordered are listed, but only abnormal results are displayed) Labs Reviewed - No data to display  EKG None  Radiology No results found.  Procedures Procedures   Medications Ordered in ED Medications - No data to display  ED Course  I have reviewed the triage vital signs and the nursing notes.  Pertinent labs & imaging results that were available during my care of the patient were reviewed by me and considered in my medical decision making (see chart for details).    MDM Rules/Calculators/A&P                          Patient's fetal heart tones here in 132's.  Patient seen by her new OB/GYN yesterday.  They did recognize the high blood pressure.  Which was pre-existing to the pregnancy.  They are addressing that with new medications.  Patient had a fall in the tub last evening.  She struck her left thigh area.  No impact directly on the abdomen.  She had some pressure feeling in the uterus area she has had that  before but it was little more intense today.  No bleeding.  Again no direct injury to the abdomen.  Patient is already had an ultrasound done by her previous OB/GYN.  She had normal fetal heart tones with her visit yesterday.  And they are planning standard follow-up ultrasound for next month.  Do not feel the patient needs formal ultrasound today.  Patient will be reassured and have her follow back up with her new OB/GYN.  For blood pressure checks. Final Clinical Impression(s) / ED Diagnoses Final diagnoses:  None    Rx / DC Orders ED Discharge Orders     None        Fredia Sorrow, MD 07/19/20 0800

## 2020-07-18 NOTE — ED Provider Notes (Signed)
[place] EMERGENCY DEPT Provider Note   CSN: [id number] Arrival date & time: 07/18/20  8299     History Chief Complaint  Patient presents with   Shelby Chandler is a 30 y.o. female.  Disregard this note it is a duplicate note.  For the same visit.      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   Anxiety and depression 08/28/2017   Decreased energy 08/28/2017   Chronic low back pain without sciatica 08/28/2017   History of anemia 08/28/2017   Essential hypertension 01/09/2017   Iron deficiency anemia due to chronic blood loss 01/09/2017   Class 3 severe obesity due to excess calories without serious comorbidity with body mass index (BMI) of 60.0 to 69.9 in adult [facility]) 01/09/2017    Past Surgical History:  Procedure Laterality Date   TOOTH EXTRACTION       OB History     Gravida  1   Para  0   Term  0   Preterm      AB  0   Living         SAB  0   IAB  0   Ectopic  0   Multiple  0   Live Births              Family History  Problem Relation Age of Onset   Alcohol abuse Mother    Arthritis Mother    Depression Mother    Drug abuse Mother    Heart attack Mother    Hypertension Mother    Hyperlipidemia Mother    Alcohol abuse Father    Diabetes Father    Drug abuse Father    Hypertension Father    Heart attack Father    Stroke Father    Hypertension Sister    Arthritis Maternal Grandmother    Arthritis Maternal Grandfather    Diabetes Paternal Grandmother     Social History   Tobacco Use   Smoking status: Never   Smokeless tobacco: Never  Substance Use Topics   Alcohol use: Not Currently   Drug use: No    Home  Medications Prior to Admission medications   Medication Sig Start Date End Date Taking? Authorizing Provider  Blood Pressure Monitoring (BLOOD PRESSURE CUFF) MISC Use as directed for checking bp daily. 07/19/19  Yes Billie Ruddy, MD  Ferrous Sulfate 220 (44 Fe) MG/5ML SOLN Take 5 mLs by mouth every other day. 11/05/19  Yes Billie Ruddy, MD  Prenatal MV-Min-Fe Fum-FA-DHA (PRENATAL 1 PO) Take by mouth. gummies   Yes [provider]  PROCARDIA XL 60 MG 24 hr tablet Take one tablet every 12 hours - twice daily 07/17/20  Yes Burleson, Terri L, NP  Vitamin D, Ergocalciferol, (DRISDOL) 1.25 MG (50000 UNIT) CAPS capsule TAKE 1 CAPSULE (50,000 UNITS TOTAL) BY MOUTH EVERY 7 (SEVEN) DAYS. 02/03/20  Yes Billie Ruddy, MD    Allergies    Patient has no known allergies.  Review of Systems   Review of Systems  Physical Exam Updated Vital Signs BP (!) 190/107   Pulse 87   Temp 98.2 F (36.8 C) (Oral)   Resp  16   LMP 04/13/2020 (Exact Date)   SpO2 100%   Physical Exam  ED Results / Procedures / Treatments   Labs (all labs ordered are listed, but only abnormal results are displayed) Labs Reviewed - No data to display  EKG None  Radiology No results found.  Procedures Procedures   Medications Ordered in ED Medications - No data to display  ED Course  I have reviewed the triage vital signs and the nursing notes.  Pertinent labs & imaging results that were available during my care of the patient were reviewed by me and considered in my medical decision making (see chart for details).    MDM Rules/Calculators/A&P                           Final Clinical Impression(s) / ED Diagnoses Final diagnoses:  None    Rx / DC Orders ED Discharge Orders     None        Fredia Sorrow, MD 07/19/20 [phone]

## 2020-07-18 NOTE — ED Provider Notes (Signed)
[place] EMERGENCY DEPT Provider Note   CSN: [id number] Arrival date & time: 07/18/20  4854     History Chief Complaint  Patient presents with   Shelby Chandler Shelby Chandler is a 30 y.o. female.  Patient is [redacted] weeks pregnant.  Due date is January 18, 2021.  Para 1 gravida 0.  Patient had a initial follow-up with local OB/GYN.  And then was transferred to high risk OB/GYN.  Patient had pre-existing hypertension and obesity.  Patient was seen by high risk OB/GYN yesterday.  They documented good fetal heart tones.  They started her on blood pressure medicine.  They are planning follow-up and a formal second ultrasound next month.  Patient had initial ultrasound done by her primary doctor.  Is known to have an IUP.  Patient had a fall last evening in the tub.  She struck her left thigh did not strike her abdomen.  Left thigh has a bruise on it.  Patient able to ambulate.  Left thigh is sore.  But patient got concerned today because she had more of a pressure feeling in her uterus more so than usual.  Denies any bleeding.  Patient's blood pressure is elevated here but her doctors to start her on new medication yesterday and she will be following up with high risk OB regarding the hypertension.      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   Anxiety and depression 08/28/2017   Decreased energy 08/28/2017   Chronic low back pain without sciatica 08/28/2017   History of anemia 08/28/2017   Essential hypertension 01/09/2017   Iron deficiency anemia due to chronic blood loss 01/09/2017   Class 3 severe obesity due to excess calories without serious comorbidity with body mass index (BMI) of 60.0 to  69.9 in adult [facility]) 01/09/2017    Past Surgical History:  Procedure Laterality Date   TOOTH EXTRACTION       OB History     Gravida  1   Para  0   Term  0   Preterm      AB  0   Living         SAB  0   IAB  0   Ectopic  0   Multiple  0   Live Births              Family History  Problem Relation Age of Onset   Alcohol abuse Mother    Arthritis Mother    Depression Mother    Drug abuse Mother    Heart attack Mother    Hypertension Mother    Hyperlipidemia Mother    Alcohol abuse Father    Diabetes Father    Drug abuse Father    Hypertension Father    Heart attack Father    Stroke Father    Hypertension Sister    Arthritis Maternal Grandmother    Arthritis Maternal Grandfather    Diabetes Paternal Grandmother     Social History   Tobacco Use   Smoking status: Never   Smokeless tobacco: Never  Substance Use Topics   Alcohol use: Not Currently   Drug use: No    Home Medications  Prior to Admission medications   Medication Sig Start Date End Date Taking? Authorizing Provider  Blood Pressure Monitoring (BLOOD PRESSURE CUFF) MISC Use as directed for checking bp daily. 07/19/19  Yes Billie Ruddy, MD  Ferrous Sulfate 220 (44 Fe) MG/5ML SOLN Take 5 mLs by mouth every other day. 11/05/19  Yes Billie Ruddy, MD  Prenatal MV-Min-Fe Fum-FA-DHA (PRENATAL 1 PO) Take by mouth. gummies   Yes [provider]  PROCARDIA XL 60 MG 24 hr tablet Take one tablet every 12 hours - twice daily 07/17/20  Yes Burleson, Terri L, NP  Vitamin D, Ergocalciferol, (DRISDOL) 1.25 MG (50000 UNIT) CAPS capsule TAKE 1 CAPSULE (50,000 UNITS TOTAL) BY MOUTH EVERY 7 (SEVEN) DAYS. 02/03/20  Yes Billie Ruddy, MD    Allergies    Patient has no known allergies.  Review of Systems   Review of Systems  Constitutional:  Negative for chills and fever.  HENT:  Negative for ear pain and sore throat.   Eyes:  Negative for pain and visual disturbance.  Respiratory:   Negative for cough and shortness of breath.   Cardiovascular:  Negative for chest pain and palpitations.  Gastrointestinal:  Negative for abdominal pain and vomiting.  Genitourinary:  Positive for pelvic pain. Negative for dysuria, hematuria and vaginal bleeding.  Musculoskeletal:  Negative for arthralgias and back pain.  Skin:  Negative for color change and rash.  Neurological:  Negative for seizures and syncope.  Hematological:  Does not bruise/bleed easily.  All other systems reviewed and are negative.  Physical Exam Updated Vital Signs BP (!) 156/101   Pulse 74   Temp 98.2 F (36.8 C) (Oral)   Resp 16   LMP 04/13/2020 (Exact Date)   SpO2 100%   Physical Exam Vitals and nursing note reviewed.  Constitutional:      General: She is not in acute distress.    Appearance: Normal appearance. She is well-developed. She is obese.  HENT:     Head: Normocephalic and atraumatic.  Eyes:     Conjunctiva/sclera: Conjunctivae normal.     Pupils: Pupils are equal, round, and reactive to light.  Cardiovascular:     Rate and Rhythm: Normal rate and regular rhythm.     Heart sounds: No murmur heard. Pulmonary:     Effort: Pulmonary effort is normal. No respiratory distress.     Breath sounds: Normal breath sounds.  Abdominal:     Palpations: Abdomen is soft.     Tenderness: There is no abdominal tenderness.  Musculoskeletal:        General: Swelling, tenderness and signs of injury present. No deformity. Normal range of motion.     Cervical back: Normal range of motion and neck supple.     Comments: Left thigh with an area of bruising measuring about 12 cm.  No leg deformity.  Patient able to ambulate.  No swelling at the knee.  Some tenderness to palpation over that bruise.  No significant swelling.  But there is some swelling.  Neurovascularly intact distally.  Skin:    General: Skin is warm and dry.  Neurological:     Mental Status: She is alert.    ED Results / Procedures /  Treatments   Labs (all labs ordered are listed, but only abnormal results are displayed) Labs Reviewed - No data to display  EKG None  Radiology No results found.  Procedures Procedures   Medications Ordered in ED Medications - No data to display  ED Course  I have reviewed the triage vital signs and the nursing notes.  Pertinent labs & imaging results that were available during my care of the patient were reviewed by me and considered in my medical decision making (see chart for details).    MDM Rules/Calculators/A&P                          Fetal heart tones here are in the 130 range.  Good strong fetal heart tones.  No direct injury to the abdomen.  Patient seen by high risk OB yesterday.  They documented good fetal heart tones 2.  Patient with previous ultrasound by her previous OB notes indicate intrauterine pregnancy.  Patient mostly concerned about the baby today.  She is able to ambulate all fine does have some discomfort in her left thigh area.  There is no obvious deformity.  No reason for x-ray.  Does have a large bruise to that area.  I think the fetal heart tones being strong reassurance for follow-up with her OB/GYN they recently adjusted her blood pressure medicine.  She will need to give them a call to see how it is responding.  Patient is hypertensive today but as stated they just adjusted her blood pressure medicine yesterday   Final Clinical Impression(s) / ED Diagnoses Final diagnoses:  [redacted] weeks gestation of pregnancy  Fall, initial encounter  Contusion of left thigh, initial encounter  Primary hypertension    Rx / DC Orders ED Discharge Orders     None        Fredia Sorrow, MD 07/18/20 [phone]

## 2020-07-19 LAB — COMPREHENSIVE METABOLIC PANEL
ALT: 12 IU/L (ref 0–32)
AST: 12 IU/L (ref 0–40)
Albumin/Globulin Ratio: 1.2 (ref 1.2–2.2)
Albumin: 4.1 g/dL (ref 3.9–5.0)
Alkaline Phosphatase: 55 IU/L (ref 44–121)
BUN/Creatinine Ratio: 8 — ABNORMAL LOW (ref 9–23)
BUN: 7 mg/dL (ref 6–20)
Bilirubin Total: <0.2 mg/dL (ref 0.0–1.2)
CO2: 21 mmol/L (ref 20–29)
Calcium: 9.6 mg/dL (ref 8.7–10.2)
Chloride: 100 mmol/L (ref 96–106)
Creatinine, Ser: 0.85 mg/dL (ref 0.57–1.00)
Globulin, Total: 3.3 g/dL (ref 1.5–4.5)
Glucose: 82 mg/dL (ref 65–99)
Potassium: 4.4 mmol/L (ref 3.5–5.2)
Sodium: 134 mmol/L (ref 134–144)
Total Protein: 7.4 g/dL (ref 6.0–8.5)
eGFR: 94 mL/min/{1.73_m2} (ref >59)

## 2020-07-19 LAB — CBC
Hematocrit: 35.5 % (ref 34.0–46.6)
Hemoglobin: 11.1 g/dL (ref 11.1–15.9)
MCH: 22.4 pg — ABNORMAL LOW (ref 26.6–33.0)
MCHC: 31.3 g/dL — ABNORMAL LOW (ref 31.5–35.7)
MCV: 72 fL — ABNORMAL LOW (ref 79–97)
Platelets: 453 10*3/uL — ABNORMAL HIGH (ref 150–450)
RBC: 4.96 x10E6/uL (ref 3.77–5.28)
RDW: 16.5 % — ABNORMAL HIGH (ref 11.7–15.4)
WBC: 6.7 10*3/uL (ref 3.4–10.8)

## 2020-07-19 LAB — PROTEIN / CREATININE RATIO, URINE
Creatinine, Urine: 205.8 mg/dL
Protein, Ur: 21.6 mg/dL
Protein/Creat Ratio: 105 mg/g{creat} (ref 0–200)

## 2020-07-21 ENCOUNTER — Ambulatory Visit: Payer: Managed Care, Other (non HMO) | Admitting: *Deleted

## 2020-07-21 ENCOUNTER — Other Ambulatory Visit: Payer: Self-pay

## 2020-07-21 VITALS — BP 149/89 | HR 93

## 2020-07-21 DIAGNOSIS — O0990 Supervision of high risk pregnancy, unspecified, unspecified trimester: Secondary | ICD-10-CM

## 2020-07-21 NOTE — Progress Notes (Signed)
Subjective:  Shelby Chandler is a 30 y.o. female here for BP check.   Hypertension ROS: taking medications as instructed, no medication side effects noted, no TIA's, no chest pain on exertion, no dyspnea on exertion, and no swelling of ankles.    Objective:  BP (!) 149/89   Pulse 93   LMP 04/13/2020 (Exact Date)   Appearance alert, well appearing, and in no distress.  General exam BP noted to be well controlled today in office.    Assessment:   Blood Pressure well controlled and improved.   Plan:  Current treatment plan is effective, no change in therapy. Follow up with provider next week.

## 2020-07-27 ENCOUNTER — Other Ambulatory Visit: Payer: Self-pay

## 2020-07-27 ENCOUNTER — Emergency Department (HOSPITAL_BASED_OUTPATIENT_CLINIC_OR_DEPARTMENT_OTHER)
Admission: EM | Admit: 2020-07-27 | Discharge: 2020-07-27 | Disposition: A | Discharge status: Home / Self Care | Payer: Managed Care, Other (non HMO) | Attending: Emergency Medicine | Admitting: Emergency Medicine

## 2020-07-27 ENCOUNTER — Encounter: Payer: Managed Care, Other (non HMO) | Admitting: Obstetrics & Gynecology

## 2020-07-27 ENCOUNTER — Encounter (HOSPITAL_BASED_OUTPATIENT_CLINIC_OR_DEPARTMENT_OTHER): Payer: Self-pay | Admitting: Emergency Medicine

## 2020-07-27 ENCOUNTER — Emergency Department (HOSPITAL_BASED_OUTPATIENT_CLINIC_OR_DEPARTMENT_OTHER): Payer: Managed Care, Other (non HMO)

## 2020-07-27 DIAGNOSIS — R0602 Shortness of breath: Secondary | ICD-10-CM | POA: Diagnosis not present

## 2020-07-27 DIAGNOSIS — O99282 Endocrine, nutritional and metabolic diseases complicating pregnancy, second trimester: Secondary | ICD-10-CM | POA: Diagnosis not present

## 2020-07-27 DIAGNOSIS — R079 Chest pain, unspecified: Secondary | ICD-10-CM | POA: Diagnosis not present

## 2020-07-27 DIAGNOSIS — Z3A15 15 weeks gestation of pregnancy: Secondary | ICD-10-CM | POA: Insufficient documentation

## 2020-07-27 DIAGNOSIS — R109 Unspecified abdominal pain: Secondary | ICD-10-CM | POA: Insufficient documentation

## 2020-07-27 DIAGNOSIS — I10 Essential (primary) hypertension: Secondary | ICD-10-CM | POA: Insufficient documentation

## 2020-07-27 DIAGNOSIS — E876 Hypokalemia: Secondary | ICD-10-CM | POA: Diagnosis not present

## 2020-07-27 DIAGNOSIS — R000 Tachycardia, unspecified: Secondary | ICD-10-CM | POA: Insufficient documentation

## 2020-07-27 DIAGNOSIS — O26892 Other specified pregnancy related conditions, second trimester: Secondary | ICD-10-CM | POA: Insufficient documentation

## 2020-07-27 DIAGNOSIS — Z3490 Encounter for supervision of normal pregnancy, unspecified, unspecified trimester: Secondary | ICD-10-CM

## 2020-07-27 LAB — CBC
HCT: 33.3 % — ABNORMAL LOW (ref 36.0–46.0)
Hemoglobin: 10.3 g/dL — ABNORMAL LOW (ref 12.0–15.0)
MCH: 22.4 pg — ABNORMAL LOW (ref 26.0–34.0)
MCHC: 30.9 g/dL (ref 30.0–36.0)
MCV: 72.4 fL — ABNORMAL LOW (ref 80.0–100.0)
Platelets: 370 10*3/uL (ref 150–400)
RBC: 4.6 MIL/uL (ref 3.87–5.11)
RDW: 17 % — ABNORMAL HIGH (ref 11.5–15.5)
WBC: 9.8 10*3/uL (ref 4.0–10.5)
nRBC: 0 % (ref 0.0–0.2)

## 2020-07-27 LAB — BASIC METABOLIC PANEL
Anion gap: 13 (ref 5–15)
BUN: 6 mg/dL (ref 6–20)
CO2: 18 mmol/L — ABNORMAL LOW (ref 22–32)
Calcium: 8.9 mg/dL (ref 8.9–10.3)
Chloride: 105 mmol/L (ref 98–111)
Creatinine, Ser: 0.74 mg/dL (ref 0.44–1.00)
GFR, Estimated: >60 mL/min (ref >60)
Glucose, Bld: 88 mg/dL (ref 70–99)
Potassium: 3.4 mmol/L — ABNORMAL LOW (ref 3.5–5.1)
Sodium: 136 mmol/L (ref 135–145)

## 2020-07-27 LAB — TROPONIN I (HIGH SENSITIVITY): Troponin I (High Sensitivity): 4 ng/L (ref <18)

## 2020-07-27 MED ORDER — POTASSIUM CHLORIDE ER 10 MEQ PO TBCR: 10.0000 meq | EXTENDED_RELEASE_TABLET | Freq: Two times a day (BID) | ORAL | 0 refills | Status: DC

## 2020-07-27 NOTE — ED Notes (Signed)
Unable to assess fetal heart tones at this time due to pt unable to lay flat. Pt is too SOB at this time. MD Pickering notified.

## 2020-07-27 NOTE — ED Triage Notes (Addendum)
Pt arrives to ED with c/o of rapid heart rate. Pt reports laying on her left side 30 minutes ago when her left arm felt weak and numb. Pt then reports sudden onset of chest tightness and shortness of breath. Pt reports inability to breath deeply. Pt is diaphoretic and c/o of abdominal cramping. Pt reports no vaginal bleeding. Pt is [redacted] weeks pregnant with first child. Pt is anemic but has not taken her iron in two weeks. No fevers or chills.

## 2020-07-27 NOTE — Discharge Instructions (Addendum)
Your fast heart rate has resolved.  Appears to been a sinus tachycardia.  We do not think this is a blood clot since your heart rate completely resolved back to normal quickly.  Follow-up with your OB.  Your potassium was also mildly decreased

## 2020-07-27 NOTE — ED Provider Notes (Signed)
[place] EMERGENCY DEPT Provider Note   CSN: [id number] Arrival date & time: 07/27/20  1450     History Chief Complaint  Patient presents with   Tachycardia    Shelby Chandler is a 30 y.o. female.  HPI Patient presents with cute onset chest pain shortness of breath.  Felt her heart racing.  Began while she was taking a nap.  States her left arm was also numb.  States that she feels as if she is short of breath.  Some tightness in her chest.  She is about [redacted] weeks pregnant.  History of anemia and history of hypertension.  On blood pressure medicines but states that she took that this morning.  Has some mild abdominal cramping.  No vaginal bleeding.  No fevers or chills.  No coughing.  No swelling in her legs.  Couple weeks ago had a fall but states has been doing well since then.  No history of anxiety.  Has not had episodes of shortness of breath like this.  This is her first pregnancy.    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   Anxiety and depression 08/28/2017   Decreased energy 08/28/2017   Chronic low back pain without sciatica 08/28/2017   History of anemia 08/28/2017   Essential hypertension 01/09/2017   Iron deficiency anemia due to chronic blood loss 01/09/2017   Class 3 severe obesity due to excess calories without serious comorbidity with body mass index (BMI) of 60.0 to 69.9 in adult [facility]) 01/09/2017    Past Surgical History:  Procedure Laterality Date   TOOTH EXTRACTION       OB History     Gravida  1   Para  0   Term  0   Preterm      AB  0   Living         SAB  0   IAB  0   Ectopic  0   Multiple  0   Live Births               Family History  Problem Relation Age of Onset   Alcohol abuse Mother    Arthritis Mother    Depression Mother    Drug abuse Mother    Heart attack Mother    Hypertension Mother    Hyperlipidemia Mother    Alcohol abuse Father    Diabetes Father    Drug abuse Father    Hypertension Father    Heart attack Father    Stroke Father    Hypertension Sister    Arthritis Maternal Grandmother    Arthritis Maternal Grandfather    Diabetes Paternal Grandmother     Social History   Tobacco Use   Smoking status: Never   Smokeless tobacco: Never  Substance Use Topics   Alcohol use: Not Currently   Drug use: No    Home Medications Prior to Admission medications   Medication Sig Start Date End Date Taking? Authorizing Provider  Blood Pressure Monitoring (BLOOD PRESSURE CUFF) MISC Use as directed for checking bp daily. 07/19/19  Yes Billie Ruddy, MD  Ferrous Sulfate 220 (44 Fe) MG/5ML SOLN Take 5 mLs  by mouth every other day. 11/05/19  Yes Billie Ruddy, MD  NIFEdipine (ADALAT CC) 60 MG 24 hr tablet Take by mouth. 07/17/20  Yes [provider]  potassium chloride (KLOR-CON) 10 MEQ tablet Take 1 tablet (10 mEq total) by mouth 2 (two) times daily. 07/27/20  Yes Davonna Belling, MD  Prenatal MV-Min-Fe Fum-FA-DHA (PRENATAL 1 PO) Take by mouth. gummies   Yes [provider]  PROCARDIA XL 60 MG 24 hr tablet Take one tablet every 12 hours - twice daily 07/17/20   Burleson, Terri L, NP  Vitamin D, Ergocalciferol, (DRISDOL) 1.25 MG (50000 UNIT) CAPS capsule TAKE 1 CAPSULE (50,000 UNITS TOTAL) BY MOUTH EVERY 7 (SEVEN) DAYS. 02/03/20   Billie Ruddy, MD    Allergies    Patient has no known allergies.  Review of Systems   Review of Systems  Constitutional:  Negative for appetite change and fatigue.  HENT:  Negative for congestion.   Respiratory:  Positive for shortness of breath.   Cardiovascular:  Positive for chest pain.  Gastrointestinal:  Negative for  abdominal pain.  Endocrine: Negative for polyuria.  Genitourinary:  Negative for flank pain.  Musculoskeletal:  Negative for back pain.  Skin:  Negative for pallor.  Neurological:  Negative for weakness.  Psychiatric/Behavioral:  Negative for confusion. The patient is not nervous/anxious.    Physical Exam Updated Vital Signs BP (!) 170/102 (BP Location: Left Wrist)   Pulse 90   Temp 98.6 F (37 C) (Oral)   Resp (!) 21   Ht 5\' 5"  (1.651 m)   Wt (!) 194.3 kg   LMP 04/13/2020 (Exact Date)   SpO2 100%   BMI 71.28 kg/m   Physical Exam Vitals and nursing note reviewed.  Constitutional:      Appearance: She is obese.  HENT:     Head: Normocephalic.  Eyes:     Pupils: Pupils are equal, round, and reactive to light.  Cardiovascular:     Rate and Rhythm: Regular rhythm.  Abdominal:     Tenderness: There is no abdominal tenderness.  Musculoskeletal:        General: No signs of injury.     Cervical back: Neck supple.     Right lower leg: No edema.     Left lower leg: No edema.  Skin:    General: Skin is warm.     Capillary Refill: Capillary refill takes less than 2 seconds.  Neurological:     Mental Status: She is alert and oriented to person, place, and time.  Psychiatric:     Comments: Patient appears somewhat anxious.    ED Results / Procedures / Treatments   Labs (all labs ordered are listed, but only abnormal results are displayed) Labs Reviewed  BASIC METABOLIC PANEL - Abnormal; Notable for the following components:      Result Value   Potassium 3.4 (*)    CO2 18 (*)    All other components within normal limits  CBC - Abnormal; Notable for the following components:   Hemoglobin 10.3 (*)    HCT 33.3 (*)    MCV 72.4 (*)    MCH 22.4 (*)    RDW 17.0 (*)    All other components within normal limits  TROPONIN I (HIGH SENSITIVITY)    EKG EKG Interpretation  Date/Time:  Monday July 27 2020 15:00:08 EDT Ventricular Rate:  147 PR Interval:  124 QRS  Duration: 95 QT Interval:  338 QTC Calculation: 529 R Axis:   72  Text Interpretation: Sinus tachycardia LAE, consider biatrial enlargement Prolonged QT interval Confirmed by Davonna Belling [phone]) on 07/27/2020 3:02:04 PM  Radiology DG Chest Portable 1 View  Result Date: 07/27/2020 CLINICAL DATA:  Pt arrives to ED with c/o of rapid heart rate. Pt reports laying on her left side 30 minutes ago when her left arm felt weak and numb. Pt then reports sudden onset of chest tightness and shortness of breath. Pt reports inability to breath deeply. Pt is diaphoretic and c/o of abdominal cramping. Pt reports no vaginal bleeding. Pt is [redacted] weeks pregnant with first child. Pt is anemic but has not taken her iron in two weeks. No fevers or chills. EXAM: PORTABLE CHEST 1 VIEW COMPARISON:  05/14/2012 FINDINGS: Cardiac silhouette is normal in size and configuration. Normal mediastinal and hilar contours. Clear lungs.  No convincing pleural effusion or pneumothorax. Skeletal structures are grossly intact. IMPRESSION: No active disease. Electronically Signed   By: Lajean Manes M.D.   On: 07/27/2020 15:52    Procedures Procedures   Medications Ordered in ED Medications - No data to display  ED Course  I have reviewed the triage vital signs and the nursing notes.  Pertinent labs & imaging results that were available during my care of the patient were reviewed by me and considered in my medical decision making (see chart for details).    MDM Rules/Calculators/A&P                          Patient presents with cute onset of shortness of breath chest pain and tachycardia.  Is [redacted] weeks pregnant.  No history of anxiety.  Initial tachycardia was sinus at up to around 150.  However with rest came down to about 80 or 90.  Not hypoxic.  Chest x-ray reassuring.  Lab work overall reassuring but mild hypokalemia.  Discussed with patient I think she is low risk for pulmonary embolism.  Was able to ambulate without  tachycardia or hypoxia.  Initial tachycardia resolved.  If this had been a large enough PE to cause a heart rate of 158 do not think it would have resolved this quickly.  Potentially could be an anxiety component.  No vaginal bleeding or discharge.  Had initial abdominal cramping that is resolved.  States the pain was only mild in her abdomen.  Did see cardiac activity on bedside ultrasound.  Discharge home with OB follow-up. Final Clinical Impression(s) / ED Diagnoses Final diagnoses:  Tachycardia  Pregnancy, unspecified gestational age  Hypokalemia    Rx / DC Orders ED Discharge Orders          Ordered    potassium chloride (KLOR-CON) 10 MEQ tablet  2 times daily        07/27/20 1757             Davonna Belling, MD 07/27/20 1806

## 2020-07-29 ENCOUNTER — Other Ambulatory Visit: Payer: Self-pay

## 2020-07-29 ENCOUNTER — Ambulatory Visit (INDEPENDENT_AMBULATORY_CARE_PROVIDER_SITE_OTHER): Payer: Managed Care, Other (non HMO) | Admitting: Women's Health

## 2020-07-29 VITALS — BP 159/90 | HR 99 | Wt >= 6400 oz

## 2020-07-29 DIAGNOSIS — F419 Anxiety disorder, unspecified: Secondary | ICD-10-CM

## 2020-07-29 DIAGNOSIS — D500 Iron deficiency anemia secondary to blood loss (chronic): Secondary | ICD-10-CM

## 2020-07-29 DIAGNOSIS — E538 Deficiency of other specified B group vitamins: Secondary | ICD-10-CM

## 2020-07-29 DIAGNOSIS — O0990 Supervision of high risk pregnancy, unspecified, unspecified trimester: Secondary | ICD-10-CM

## 2020-07-29 DIAGNOSIS — I10 Essential (primary) hypertension: Secondary | ICD-10-CM

## 2020-07-29 DIAGNOSIS — Z6841 Body Mass Index (BMI) 40.0 and over, adult: Secondary | ICD-10-CM

## 2020-07-29 DIAGNOSIS — F32A Depression, unspecified: Secondary | ICD-10-CM

## 2020-07-29 DIAGNOSIS — B009 Herpesviral infection, unspecified: Secondary | ICD-10-CM

## 2020-07-29 DIAGNOSIS — Z3A15 15 weeks gestation of pregnancy: Secondary | ICD-10-CM

## 2020-07-29 MED ORDER — LABETALOL HCL 200 MG PO TABS: 200.0000 mg | ORAL_TABLET | Freq: Two times a day (BID) | ORAL | 3 refills | Status: DC

## 2020-07-29 MED ORDER — ASPIRIN EC 81 MG PO TBEC: 81.0000 mg | DELAYED_RELEASE_TABLET | Freq: Every day | ORAL | 6 refills | Status: DC

## 2020-07-29 NOTE — Patient Instructions (Addendum)
Maternity Assessment Unit (MAU)  The Maternity Assessment Unit (MAU) is located at the [facility] and [place] at [facility]. The address is: [address], [place], [place], [place] [address]. Please see map below for additional directions.    The Maternity Assessment Unit is designed to help you during your pregnancy, and for up to 6 weeks after delivery, with any pregnancy- or postpartum-related emergencies, if you think you are in labor, or if your water has broken. For example, if you experience nausea and vomiting, vaginal bleeding, severe abdominal or pelvic pain, elevated blood pressure or other problems related to your pregnancy or postpartum time, please come to the Maternity Assessment Unit for assistance.       Hypertension During Pregnancy High blood pressure (hypertension) is when the force of blood pumping through the arteries is high enough to cause problems with your health. Arteries are blood vessels that carry blood from the heart throughout the body. Hypertension during pregnancy can causeproblems for you and your baby. It can be mild or severe. There are different types of hypertension that can happen during pregnancy. These include: Chronic hypertension. This happens when you had high blood pressure before you became pregnant, and it continues during the pregnancy. Hypertension that develops before you are [redacted] weeks pregnant and continues during the pregnancy is also called chronic hypertension. If you have chronic hypertension, it will not go away after you have your baby. You will need follow-up visits with your health care provider after you have your baby. Your health care provider may want you to keep taking medicine for your blood pressure. Gestational hypertension. This is hypertension that develops after the 20th week of pregnancy. Gestational hypertension usually goes away after you have your baby, but your health care provider will need to  monitor your blood pressure to make sure that it is getting better. Postpartum hypertension. This is high blood pressure that was present before delivery and continues after delivery or that starts after delivery. This usually occurs within 48 hours after childbirth but may occur up to 6 weeks after giving birth. When hypertension during pregnancy is severe, it is a medical emergency thatrequires treatment right away. How does this affect me? Women who have hypertension during pregnancy have a greater chance of developing hypertension later in life or during future pregnancies. In some cases, hypertension during pregnancy can cause serious complications, such as: Stroke. Heart attack. Injury to other organs, such as kidneys, lungs, or liver. Preeclampsia. A condition called hemolysis, elevated liver enzymes, and low platelet count (HELLP) syndrome. Convulsions or seizures. Placental abruption. How does this affect my baby? Hypertension during pregnancy can affect your baby. Your baby may: Be born early (prematurely). Not weigh as much as he or she should at birth (low birth weight). Not tolerate labor well, leading to an unplanned cesarean delivery. This condition may also result in a baby's death before birth (stillbirth). What are the risks? There are certain factors that make it more likely for you to develop hypertension during pregnancy. These include: Having hypertension during a previous pregnancy or a family history of hypertension. Being overweight. Being age 57 or older. Being pregnant for the first time. Being pregnant with more than one baby. Becoming pregnant using fertilization methods, such as IVF (in vitro fertilization). Having other medical problems, such as diabetes, kidney disease, or lupus. What can I do to lower my risk? The exact cause of hypertension during pregnancy is not known. You may be able to  lower your risk by: Maintaining a healthy weight. Eating a  healthy and balanced diet. Following your health care provider's instructions about treating any long-term conditions that you had before becoming pregnant. It is very important to keep all of your prenatal care appointments. Your health care provider will check your blood pressure and make sure that your pregnancy is progressing as expected. If a problem is found, early treatmentcan prevent complications. How is this treated? Treatment for hypertension during pregnancy varies depending on the type of hypertension you have and how serious it is. If you were taking medicine for high blood pressure before you became pregnant, talk with your health care provider. You may need to change medicine during pregnancy because some medicines, like ACE inhibitors, may not be considered safe for your baby. If you have gestational hypertension, your health care provider may order medicine to treat this during pregnancy. If you are at risk for preeclampsia, your health care provider may recommend that you take a low-dose aspirin during your pregnancy. If you have severe hypertension, you may need to be hospitalized so you and your baby can be monitored closely. You may also need to be given medicine to lower your blood pressure. In some cases, if your condition gets worse, you may need to deliver your baby early. Follow these instructions at home: Eating and drinking  Drink enough fluid to keep your urine pale yellow. Avoid caffeine.  Lifestyle Do not use any products that contain nicotine or tobacco. These products include cigarettes, chewing tobacco, and vaping devices, such as e-cigarettes. If you need help quitting, ask your health care provider. Do not use alcohol or drugs. Avoid stress as much as possible. Rest and get plenty of sleep. Regular exercise can help to reduce your blood pressure. Ask your health care provider what kinds of exercise are best for you. General instructions Take  over-the-counter and prescription medicines only as told by your health care provider. Keep all prenatal and follow-up visits. This is important. Contact a health care provider if: You have symptoms that your health care provider told you may require more treatment or monitoring, such as: Headaches. Nausea or vomiting. Abdominal pain. Dizziness. Light-headedness. Get help right away if: You have symptoms of serious complications, such as: Severe abdominal pain that does not get better with treatment. A severe headache that does not get better, blurred vision, or double vision. Vomiting that does not get better. Sudden, rapid weight gain or swelling in your hands, ankles, or face. Vaginal bleeding. Blood in your urine. Shortness of breath or chest pain. Weakness on one side of your body or difficulty speaking. Your baby is not moving as much as usual. These symptoms may represent a serious problem that is an emergency. Do not wait to see if the symptoms will go away. Get medical help right away. Call your local emergency services (911 in the [place] Do not drive yourself to the hospital. Summary Hypertension during pregnancy can cause problems for you and your baby. Treatment for hypertension during pregnancy varies depending on the type of hypertension you have and how serious it is. Keep all prenatal and follow-up visits. This is important. Get help right away if you have symptoms of serious complications related to high blood pressure. This information is not intended to replace advice given to you by your health care provider. Make sure you discuss any questions you have with your healthcare provider. Document Revised: 10/17/2019 Document Reviewed: 10/17/2019 Elsevier Patient Education  2022 [employer].  Pregnancy and Anemia  Anemia is a condition in which there is not enough red blood cells or hemoglobin in the blood. Hemoglobin is a substance in red blood cells that  carries oxygen. When you do not have enough red blood cells or hemoglobin (are anemic), your body cannot get enough oxygen and your organs may not work properly. Anemia is common during pregnancy because your body needs more blood volume andblood cells to provide nutrition to the unborn baby. What are the causes? The most common cause of anemia during pregnancy is not having enough iron in the body to make red blood cells (iron deficiency anemia). Other causes may include: Folic acid deficiency. Vitamin B12 deficiency. Certain prescription or over-the-counter medicines. Certain medical conditions or infections that destroy red blood cells. A low platelet count and bleeding caused by antibodies that go through the placenta to the baby from the mother's blood. What are the signs or symptoms? Mild anemia may not cause any symptoms. If anemia becomes severe, symptoms may include: Feeling tired or weak. Shortness of breath, especially during activity. Fainting. Pale skin. Headaches. A fast or irregular heartbeat. Dizziness. How is this diagnosed? This condition may be diagnosed based on your medical history and a physicalexam. You may also have blood tests. How is this treated? Treatment for anemia during pregnancy depends on the cause of the anemia. Treatment may include: Making changes to your diet. Taking iron, vitamin U93, or folic acid supplements. Having a blood transfusion. This may be needed if the anemia is severe. Follow these instructions at home: Eating and drinking Follow recommendations from your health care provider about changing your diet. Eat a diet rich in iron. This would include foods such as: Liver. Beef. Eggs. Whole grains. Spinach. Dried fruit. Increase your vitamin C intake. This will help the stomach absorb more iron. Some foods that are high in vitamin C include: Oranges. Peppers. Tomatoes. Mangoes. Eat green leafy vegetables. These are a good source of  folic acid. General instructions Take iron supplements and vitamins as told by your health care provider. Keep all follow-up visits. This is important. Contact a health care provider if: You have headaches that happen often or do not go away. You bruise easily. You have a fever. You have nausea and vomiting for more than 24 hours. You are unable to take supplements prescribed to treat your anemia. Get help right away if: You develop signs or symptoms of severe anemia. You have bleeding from your vagina. You develop a rash. You have bloody or tarry stools. You are very dizzy or you faint. Summary Anemia is a condition in which there is not enough red blood cells or hemoglobin in the blood. The most common cause of anemia during pregnancy is not having enough iron in the body to make red blood cells (iron deficiency anemia). Mild anemia may not cause any symptoms. If it becomes severe, symptoms may include feeling tired and weak. Take iron supplements and vitamins as told by your health care provider. Keep all follow-up visits. This is important. This information is not intended to replace advice given to you by your health care provider. Make sure you discuss any questions you have with your healthcare provider. Document Revised: 05/28/2019 Document Reviewed: 05/28/2019 Elsevier Patient Education  [place].       Pregnancy and Genital Herpes  Genital herpes is an STI (sexually transmitted infection) that is caused by the herpes simplex virus (HSV). HSV can cause an outbreak of itching, blisters,  and sores (ulcers) around the genitals and rectum. Even when the outbreak goes away, the virusstays in the body. If you are pregnant, you can pass HSV to your baby. If you become infected with HSV for the first time while you are pregnant, the virus can cause serious problems for your baby. If you had HSV before your pregnancy, the virus may not affect your baby as seriously. Babies  that are infected with HSV are at risk for developing inflammation of the brain (encephalitis), damage to organs, and problems with development. How does this affect me? Your type of delivery may be affected. You may be able to have a vaginal delivery if you have no evidence of an outbreak when you go into labor. However, your baby may need to be delivered by C-section (cesarean delivery) if you have: An active, recurrent, or new herpes outbreak at the time of delivery. This is because the virus can pass to your baby through an infected birth canal. This can cause severe problems for your baby. Any symptoms of infection in the areas around the genitals such as pain, burning, and itching, even if you do not have any ulcers in the birth canal. After delivery, you can breastfeed your baby. The virus will not be present in breast milk. While caring for your baby, you will need to take steps to avoidpassing the virus on to your baby. How does this affect my baby? If the virus passes to your baby, it can cause serious problems. The virus can be passed to your baby: Before delivery. The virus can be passed to your unborn baby through the placenta. This is more likely to happen if you get herpes for the first time in the first 3 months of pregnancy (first trimester). This may cause your baby to have a congenital disability. During delivery. This is more likely to happen if you become infected for the first time late in your pregnancy. After delivery. Your baby can get a herpes infection if you touch active ulcers and then touch your baby without washing your hands. The virus is less likely to pass to your baby if you had herpes before you became pregnant. This is because antibodies against the virus develop over aperiod of time. These antibodies help to protect the baby. How is this treated? This condition can be treated with medicines during pregnancy that are safe for you and your baby. These medicines can  help to reduce symptoms, shorten an outbreak, and prevent another outbreak of the infection. If the infection happened before you became pregnant, you may need to take medicine late in yourpregnancy to help to prevent a breakout at the time of delivery. Follow these instructions at home: To avoid passing the virus to your baby: Wash your hands with soap and water often and before touching your baby. If you have an outbreak, keep the area clean and covered. If ulcers are present on your breast, do not breastfeed from the affected breast. Contact a health care provider if: You have a rash, blisters, or ulcers in the area around your genitals or rectum. You have burning, itching, or pain in the area around your genitals or rectum. You have trouble urinating. Summary Genital herpes is an STI (sexually transmitted infection) that is caused by the herpes simplex virus (HSV). If you are pregnant, you can pass the virus to your baby. Even when the outbreak goes away, the virus stays in your body. Genital herpes can be passed to your unborn  or newborn baby and cause serious problems. This condition can be treated with medicines during pregnancy that are safe for you and your baby. Medicines can treat your symptoms, shorten the length of an outbreak, and prevent another outbreak of the infection. If you have signs or symptoms of a herpes outbreak when you go into labor, your health care provider may recommend a C-section (cesarean delivery) to lower the risk of passing the virus to your baby. This information is not intended to replace advice given to you by your health care provider. Make sure you discuss any questions you have with your healthcare provider. Document Revised: 05/18/2018 Document Reviewed: 04/26/2018 Elsevier Patient Education  [place].       Preterm Labor The normal length of a pregnancy is 39-41 weeks. Preterm labor is when labor starts before 37 completed weeks of  pregnancy. Babies who are born prematurely and survive may not be fully developed and may be at an increased risk for long-term problems such as cerebral palsy, developmental delays, and vision andhearing problems. Babies who are born too early may have problems soon after birth. Premature babies may have problems regulating blood sugar, body temperature, heart rate, and breathing rate. These babies often have trouble with feeding. The risk ofhaving problems is highest for babies who are born before 47 weeks of pregnancy. What are the causes? The exact cause of this condition is not known. What increases the risk? You are more likely to have preterm labor if you have certain risk factors that relate to your medical history, problems with present and past pregnancies, andlifestyle factors. Medical history You have abnormalities of the uterus, including a short cervix. You have STIs (sexually transmitted infections) or other infections of the urinary tract and the vagina. You have chronic illnesses, such as blood clotting problems, diabetes, or high blood pressure. You are overweight or underweight. Present and past pregnancies You have had preterm labor before. You are pregnant with twins or other multiples. You have been diagnosed with a condition in which the placenta covers your cervix (placenta previa). You waited less than 18 months between giving birth and becoming pregnant again. Your unborn baby has some abnormalities. You have vaginal bleeding during pregnancy. You became pregnant through in vitro fertilization (IVF). Lifestyle and environmental factors You use tobacco products or drink alcohol. You use drugs. You have stress and no social support. You experience domestic violence. You are exposed to certain chemicals or environmental pollutants. Other factors You are younger than age 53 or older than age 62. What are the signs or symptoms? Symptoms of this condition  include: Cramps similar to those that can happen during a menstrual period. The cramps may happen with diarrhea. Pain in the abdomen or lower back. Regular contractions that may feel like tightening of the abdomen. A feeling of increased pressure in the pelvis. Increased watery or bloody mucus discharge from the vagina. Water breaking (ruptured amniotic sac). How is this diagnosed? This condition is diagnosed based on: Your medical history and a physical exam. A pelvic exam. An ultrasound. Monitoring your uterus for contractions. Other tests, including: A swab of the cervix to check for a chemical called fetal fibronectin. Urine tests. How is this treated? Treatment for this condition depends on the length of your pregnancy, your condition, and the health of your baby. Treatment may include: Taking medicines, such as: Hormone medicines. These may be given early in pregnancy to help support the pregnancy. Medicines to stop contractions. Medicines to  help mature the baby's lungs. These may be prescribed if the risk of delivery is high. Medicines to help protect your baby from brain and nerve complications such as cerebral palsy. Bed rest. If the labor happens before 34 weeks of pregnancy, you may need to stay in the hospital. Delivery of the baby. Follow these instructions at home:  Do not use any products that contain nicotine or tobacco. These products include cigarettes, chewing tobacco, and vaping devices, such as e-cigarettes. If you need help quitting, ask your health care provider. Do not drink alcohol. Take over-the-counter and prescription medicines only as told by your health care provider. Rest as told by your health care provider. Return to your normal activities as told by your health care provider. Ask your health care provider what activities are safe for you. Keep all follow-up visits. This is important. How is this prevented? To increase your chance of having a  full-term pregnancy: Do not use drugs or take medicines that have not been prescribed to you during your pregnancy. Talk with your health care provider before taking any herbal supplements, even if you have been taking them regularly. Make sure you gain a healthy amount of weight during your pregnancy. Watch for infection. If you think that you might have an infection, get it checked right away. Symptoms of infection may include: Fever. Abnormal vaginal discharge or discharge that smells bad. Pain or burning with urination. Needing to urinate urgently. Frequently urinating or passing small amounts of urine frequently. Blood in your urine or urine that smells bad or unusual. Where to find more information [place]. Department of Health and [employer], systems on Women's Health: [place] [employer]: www.acog.org Centers for Disease Control and Prevention, Preterm Birth: http://www.wolf.info/ Contact a health care provider if: You think you are going into preterm labor. You have signs or symptoms of preterm labor. You have symptoms of infection. Get help right away if: You are having regular, painful contractions every 5 minutes or less. Your water breaks. Summary Preterm labor is labor that starts before you reach 37 weeks of pregnancy. Delivering your baby early increases your baby's risk of developing long-term problems. You are more likely to have preterm labor if you have certain risk factors that relate to your medical history, problems with present and past pregnancies, and lifestyle factors. Keep all follow-up visits. This is important. Contact a health care provider if you have signs or symptoms of preterm labor. This information is not intended to replace advice given to you by your health care provider. Make sure you discuss any questions you have with your healthcare provider. Document Revised: 01/28/2020 Document Reviewed:  01/28/2020 Elsevier Patient Education  2022 [employer].

## 2020-07-29 NOTE — Progress Notes (Signed)
Pt had recent visit to ED at [place]- had SOB and increase Heart rate.

## 2020-07-29 NOTE — Progress Notes (Signed)
Subjective:  Shelby Chandler is a 30 y.o. G1P0000 at [redacted]w[redacted]d being seen today for ongoing prenatal care.  She is currently monitored for the following issues for this high-risk pregnancy and has Essential hypertension; Iron deficiency anemia due to chronic blood loss; Class 3 severe obesity due to excess calories without serious comorbidity with body mass index (BMI) of 60.0 to 69.9 in adult [facility]); Anxiety and depression; Chronic low back pain without sciatica; History of anemia; HSV-2 infection; Vitamin B12 deficiency; Absolute anemia; and Supervision of high risk pregnancy, antepartum on their problem list.  Patient reports no complaints.  Contractions: Not present. Vag. Bleeding: None.   . Denies leaking of fluid.   The following portions of the patient's history were reviewed and updated as appropriate: allergies, current medications, past family history, past medical history, past social history, past surgical history and problem list. Problem list updated.  Objective:   Vitals:   07/29/20 1510  BP: (!) 159/90  Pulse: 99  Weight: (!) 433 lb (196.4 kg)    Fetal Status: Fetal Heart Rate (bpm): present on [place]         General:  Alert, oriented and cooperative. Patient is in no acute distress.  Skin: Skin is warm and dry. No rash noted.   Cardiovascular: Normal heart rate noted  Respiratory: Normal respiratory effort, no problems with respiration noted  Abdomen: Soft, gravid, appropriate for gestational age. Pain/Pressure: Absent     Pelvic: Vag. Bleeding: None     Cervical exam deferred        Extremities: Normal range of motion.     Mental Status: Normal mood and affect. Normal behavior. Normal judgment and thought content.   Urinalysis:      Assessment and Plan:  Pregnancy: G1P0000 at [redacted]w[redacted]d  1. Supervision of high risk pregnancy, antepartum - Genetic Screening - AFP today  2. Essential hypertension -BP today 159/90 -pt reports is taking Procardia 60mg  XL BID -per   consultation with Dr. Elgie Congo, will add labetalol 200mg  BID and repeat BP check in one week -referral to [facility], pt has never seen cardiologist for HTN  3. Iron deficiency anemia due to chronic blood loss CBC Latest Ref Rng & Units 07/27/2020 07/17/2020 10/24/2019  WBC 4.0 - 10.5 K/uL 9.8 6.7 7.1  Hemoglobin 12.0 - 15.0 g/dL 10.3(L) 11.1 11.4(L)  Hematocrit 36.0 - 46.0 % 33.3(L) 35.5 36.0  Platelets 150 - 400 K/uL 370 453(H) 509(H)  -pt currently taking liquid iron  4. Class 3 severe obesity due to excess calories without serious comorbidity with body mass index (BMI) of 60.0 to 69.9 in adult [facility]) - on low dose ASA - HgB A1c  5. Anxiety and depression -pt reports has a therapist and sees them monthly and has solid plan for anxiety and depression management  6. HSV-2 infection -suppression at 20/36 weeks  7. [redacted] weeks gestation of pregnancy  Preterm labor symptoms and general obstetric precautions including but not limited to vaginal bleeding, contractions, leaking of fluid and fetal movement were reviewed in detail with the patient. I discussed the assessment and treatment plan with the patient. The patient was provided an opportunity to ask questions and all were answered. The patient agreed with the plan and demonstrated an understanding of the instructions. The patient was advised to call back or seek an in-person office evaluation/go to [facility] at [facility] for any urgent or concerning symptoms. Please refer to After Visit Summary for other counseling recommendations.  Return in about  4 weeks (around 08/26/2020) for in-person HOB/MD ONLY, needs BP check in 1 week, referral to [facility]io made.   Cletis Clack, Gerrie Nordmann, NP

## 2020-08-03 ENCOUNTER — Encounter: Payer: Managed Care, Other (non HMO) | Admitting: Obstetrics and Gynecology

## 2020-08-04 LAB — AFP, SERUM, OPEN SPINA BIFIDA
AFP MoM: 0.91
AFP Value: 19.6 ng/mL
Gest. Age on Collection Date: 15.2 weeks
Maternal Age At EDD: 31.4 yr
OSBR Risk 1 IN: 10000
Test Results:: NEGATIVE
Weight: 433 [lb_av]

## 2020-08-04 LAB — HEMOGLOBIN A1C
Est. average glucose Bld gHb Est-mCnc: 105 mg/dL
Hgb A1c MFr Bld: 5.3 % (ref 4.8–5.6)

## 2020-08-05 ENCOUNTER — Ambulatory Visit: Payer: Managed Care, Other (non HMO)

## 2020-08-05 ENCOUNTER — Other Ambulatory Visit: Payer: Self-pay

## 2020-08-05 VITALS — BP 135/87 | HR 78

## 2020-08-05 DIAGNOSIS — Z0130 Encounter for examination of blood pressure without abnormal findings: Secondary | ICD-10-CM

## 2020-08-05 NOTE — Progress Notes (Addendum)
Subjective:  Shelby Chandler is a 30 y.o. female here for BP check.   Hypertension ROS: taking medications as instructed, no medication side effects noted, no TIA's, no chest pain on exertion, no dyspnea on exertion, and no swelling of ankles.    Objective:  BP 135/87   Pulse 78   LMP 04/13/2020 (Exact Date)   Appearance alert, well appearing, and in no distress. General exam BP noted to be not controlled today in office.    Assessment:   Blood Pressure: not well controlled  Plan:  Message sent to provider to  review blood pressure reading. I have advised patient to f/up next week for a repeat blood pressure check since her cuff at home is not working. Per Dr.Eckstat patient should increase her labetalol to 400 BID. Recheck blood pressure is one week.

## 2020-08-07 ENCOUNTER — Emergency Department (HOSPITAL_BASED_OUTPATIENT_CLINIC_OR_DEPARTMENT_OTHER): Payer: Managed Care, Other (non HMO)

## 2020-08-07 ENCOUNTER — Telehealth: Payer: Self-pay | Admitting: Women's Health

## 2020-08-07 ENCOUNTER — Encounter: Payer: Self-pay | Admitting: Women's Health

## 2020-08-07 ENCOUNTER — Emergency Department (HOSPITAL_BASED_OUTPATIENT_CLINIC_OR_DEPARTMENT_OTHER)
Admission: EM | Admit: 2020-08-07 | Discharge: 2020-08-08 | Disposition: A | Discharge status: Home / Self Care | Payer: Managed Care, Other (non HMO) | Attending: Emergency Medicine | Admitting: Emergency Medicine

## 2020-08-07 ENCOUNTER — Encounter (HOSPITAL_BASED_OUTPATIENT_CLINIC_OR_DEPARTMENT_OTHER): Payer: Self-pay | Admitting: Emergency Medicine

## 2020-08-07 ENCOUNTER — Other Ambulatory Visit: Payer: Self-pay

## 2020-08-07 DIAGNOSIS — Z7982 Long term (current) use of aspirin: Secondary | ICD-10-CM | POA: Insufficient documentation

## 2020-08-07 DIAGNOSIS — I10 Essential (primary) hypertension: Secondary | ICD-10-CM | POA: Insufficient documentation

## 2020-08-07 DIAGNOSIS — Z3A16 16 weeks gestation of pregnancy: Secondary | ICD-10-CM | POA: Diagnosis not present

## 2020-08-07 DIAGNOSIS — M79601 Pain in right arm: Secondary | ICD-10-CM | POA: Insufficient documentation

## 2020-08-07 DIAGNOSIS — O26892 Other specified pregnancy related conditions, second trimester: Secondary | ICD-10-CM | POA: Diagnosis present

## 2020-08-07 DIAGNOSIS — Z20822 Contact with and (suspected) exposure to covid-19: Secondary | ICD-10-CM | POA: Insufficient documentation

## 2020-08-07 DIAGNOSIS — R202 Paresthesia of skin: Secondary | ICD-10-CM | POA: Insufficient documentation

## 2020-08-07 DIAGNOSIS — Z79899 Other long term (current) drug therapy: Secondary | ICD-10-CM | POA: Diagnosis not present

## 2020-08-07 DIAGNOSIS — R29818 Other symptoms and signs involving the nervous system: Secondary | ICD-10-CM | POA: Diagnosis not present

## 2020-08-07 DIAGNOSIS — R2990 Unspecified symptoms and signs involving the nervous system: Secondary | ICD-10-CM

## 2020-08-07 LAB — URINALYSIS, ROUTINE W REFLEX MICROSCOPIC
Bilirubin Urine: NEGATIVE
Glucose, UA: NEGATIVE mg/dL
Hgb urine dipstick: NEGATIVE
Ketones, ur: NEGATIVE mg/dL
Nitrite: NEGATIVE
Protein, ur: NEGATIVE mg/dL
Specific Gravity, Urine: 1.009 (ref 1.005–1.030)
pH: 6 (ref 5.0–8.0)

## 2020-08-07 LAB — RAPID URINE DRUG SCREEN, HOSP PERFORMED
Amphetamines: NOT DETECTED
Barbiturates: NOT DETECTED
Benzodiazepines: NOT DETECTED
Cocaine: NOT DETECTED
Opiates: NOT DETECTED
Tetrahydrocannabinol: NOT DETECTED

## 2020-08-07 LAB — CBC
HCT: 32.6 % — ABNORMAL LOW (ref 36.0–46.0)
Hemoglobin: 10.3 g/dL — ABNORMAL LOW (ref 12.0–15.0)
MCH: 23.1 pg — ABNORMAL LOW (ref 26.0–34.0)
MCHC: 31.6 g/dL (ref 30.0–36.0)
MCV: 73.1 fL — ABNORMAL LOW (ref 80.0–100.0)
Platelets: 466 10*3/uL — ABNORMAL HIGH (ref 150–400)
RBC: 4.46 MIL/uL (ref 3.87–5.11)
RDW: 16.9 % — ABNORMAL HIGH (ref 11.5–15.5)
WBC: 9.6 10*3/uL (ref 4.0–10.5)
nRBC: 0 % (ref 0.0–0.2)

## 2020-08-07 LAB — ETHANOL: Alcohol, Ethyl (B): <10 mg/dL (ref <10)

## 2020-08-07 LAB — DIFFERENTIAL
Abs Immature Granulocytes: 0.02 10*3/uL (ref 0.00–0.07)
Basophils Absolute: 0 10*3/uL (ref 0.0–0.1)
Basophils Relative: 0 %
Eosinophils Absolute: 0.3 10*3/uL (ref 0.0–0.5)
Eosinophils Relative: 3 %
Immature Granulocytes: 0 %
Lymphocytes Relative: 25 %
Lymphs Abs: 2.4 10*3/uL (ref 0.7–4.0)
Monocytes Absolute: 0.9 10*3/uL (ref 0.1–1.0)
Monocytes Relative: 9 %
Neutro Abs: 6.1 10*3/uL (ref 1.7–7.7)
Neutrophils Relative %: 63 %

## 2020-08-07 LAB — PREGNANCY, URINE: Preg Test, Ur: POSITIVE — AB

## 2020-08-07 LAB — COMPREHENSIVE METABOLIC PANEL
ALT: 25 U/L (ref 0–44)
AST: 18 U/L (ref 15–41)
Albumin: 3.7 g/dL (ref 3.5–5.0)
Alkaline Phosphatase: 45 U/L (ref 38–126)
Anion gap: 12 (ref 5–15)
BUN: 7 mg/dL (ref 6–20)
CO2: 20 mmol/L — ABNORMAL LOW (ref 22–32)
Calcium: 9.3 mg/dL (ref 8.9–10.3)
Chloride: 103 mmol/L (ref 98–111)
Creatinine, Ser: 0.81 mg/dL (ref 0.44–1.00)
GFR, Estimated: >60 mL/min (ref >60)
Glucose, Bld: 88 mg/dL (ref 70–99)
Potassium: 3.3 mmol/L — ABNORMAL LOW (ref 3.5–5.1)
Sodium: 135 mmol/L (ref 135–145)
Total Bilirubin: 0.2 mg/dL — ABNORMAL LOW (ref 0.3–1.2)
Total Protein: 7.8 g/dL (ref 6.5–8.1)

## 2020-08-07 LAB — CBG MONITORING, ED: Glucose-Capillary: 91 mg/dL (ref 70–99)

## 2020-08-07 LAB — APTT: aPTT: 30 seconds (ref 24–36)

## 2020-08-07 LAB — PROTIME-INR
INR: 1 (ref 0.8–1.2)
Prothrombin Time: 12.9 seconds (ref 11.4–15.2)

## 2020-08-07 LAB — RESP PANEL BY RT-PCR (FLU A&B, COVID) ARPGX2
Influenza A by PCR: NEGATIVE
Influenza B by PCR: NEGATIVE
SARS Coronavirus 2 by RT PCR: NEGATIVE

## 2020-08-07 NOTE — ED Notes (Signed)
Code stroke cart at bedside

## 2020-08-07 NOTE — ED Notes (Signed)
Activated Code Stroke per Dr Hulan Fess 2pm--Right sided weakness

## 2020-08-07 NOTE — ED Provider Notes (Signed)
10:49 PM Patient arrives from [place] for MRI. See prior notes for full H&P.  Briefly, 30 y.o. F who presented to the ED initially for right arm heaviness and left leg tingling beginning around 2PM.  Activated as a code stroke on arrival to ED.  CT head there was concerning for possible aneurysm vs artifact.  Currently, states symptoms seem to be starting to go away.  VS remain stable.  Plan:  MRI/ MRA pending.  Results for orders placed or performed during the hospital encounter of 08/07/20  Resp Panel by RT-PCR (Flu A&B, Covid) Nasopharyngeal Swab   Specimen: Nasopharyngeal Swab; Nasopharyngeal(NP) swabs in vial transport medium  Result Value Ref Range   SARS Coronavirus 2 by RT PCR NEGATIVE NEGATIVE   Influenza A by PCR NEGATIVE NEGATIVE   Influenza B by PCR NEGATIVE NEGATIVE  Ethanol  Result Value Ref Range   Alcohol, Ethyl (B) <10 <10 mg/dL  Protime-INR  Result Value Ref Range   Prothrombin Time 12.9 11.4 - 15.2 seconds   INR 1.0 0.8 - 1.2  APTT  Result Value Ref Range   aPTT 30 24 - 36 seconds  CBC  Result Value Ref Range   WBC 9.6 4.0 - 10.5 K/uL   RBC 4.46 3.87 - 5.11 MIL/uL   Hemoglobin 10.3 (L) 12.0 - 15.0 g/dL   HCT 32.6 (L) 36.0 - 46.0 %   MCV 73.1 (L) 80.0 - 100.0 fL   MCH 23.1 (L) 26.0 - 34.0 pg   MCHC 31.6 30.0 - 36.0 g/dL   RDW 16.9 (H) 11.5 - 15.5 %   Platelets 466 (H) 150 - 400 K/uL   nRBC 0.0 0.0 - 0.2 %  Differential  Result Value Ref Range   Neutrophils Relative % 63 %   Neutro Abs 6.1 1.7 - 7.7 K/uL   Lymphocytes Relative 25 %   Lymphs Abs 2.4 0.7 - 4.0 K/uL   Monocytes Relative 9 %   Monocytes Absolute 0.9 0.1 - 1.0 K/uL   Eosinophils Relative 3 %   Eosinophils Absolute 0.3 0.0 - 0.5 K/uL   Basophils Relative 0 %   Basophils Absolute 0.0 0.0 - 0.1 K/uL   Immature Granulocytes 0 %   Abs Immature Granulocytes 0.02 0.00 - 0.07 K/uL  Comprehensive metabolic panel  Result Value Ref Range   Sodium 135 135 - 145 mmol/L   Potassium 3.3 (L) 3.5  - 5.1 mmol/L   Chloride 103 98 - 111 mmol/L   CO2 20 (L) 22 - 32 mmol/L   Glucose, Bld 88 70 - 99 mg/dL   BUN 7 6 - 20 mg/dL   Creatinine, Ser 0.81 0.44 - 1.00 mg/dL   Calcium 9.3 8.9 - 10.3 mg/dL   Total Protein 7.8 6.5 - 8.1 g/dL   Albumin 3.7 3.5 - 5.0 g/dL   AST 18 15 - 41 U/L   ALT 25 0 - 44 U/L   Alkaline Phosphatase 45 38 - 126 U/L   Total Bilirubin 0.2 (L) 0.3 - 1.2 mg/dL   GFR, Estimated >60 >60 mL/min   Anion gap 12 5 - 15  Urine rapid drug screen (hosp performed)  Result Value Ref Range   Opiates NONE DETECTED NONE DETECTED   Cocaine NONE DETECTED NONE DETECTED   Benzodiazepines NONE DETECTED NONE DETECTED   Amphetamines NONE DETECTED NONE DETECTED   Tetrahydrocannabinol NONE DETECTED NONE DETECTED   Barbiturates NONE DETECTED NONE DETECTED  Urinalysis, Routine w reflex microscopic Urine, Clean Catch  Result Value  Ref Range   Color, Urine YELLOW YELLOW   APPearance CLEAR CLEAR   Specific Gravity, Urine 1.009 1.005 - 1.030   pH 6.0 5.0 - 8.0   Glucose, UA NEGATIVE NEGATIVE mg/dL   Hgb urine dipstick NEGATIVE NEGATIVE   Bilirubin Urine NEGATIVE NEGATIVE   Ketones, ur NEGATIVE NEGATIVE mg/dL   Protein, ur NEGATIVE NEGATIVE mg/dL   Nitrite NEGATIVE NEGATIVE   Leukocytes,Ua MODERATE (A) NEGATIVE   RBC / HPF 6-10 0 - 5 RBC/hpf   WBC, UA 6-10 0 - 5 WBC/hpf   Bacteria, UA MANY (A) NONE SEEN   Squamous Epithelial / LPF 0-5 0 - 5  Pregnancy, urine  Result Value Ref Range   Preg Test, Ur POSITIVE (A) NEGATIVE  CBG monitoring, ED  Result Value Ref Range   Glucose-Capillary 91 70 - 99 mg/dL   MR ANGIO HEAD WO CONTRAST  Result Date: 08/08/2020 CLINICAL DATA:  Abnormal right-sided sensations during pregnancy. EXAM: MRI HEAD WITHOUT CONTRAST MRA HEAD WITHOUT CONTRAST TECHNIQUE: Multiplanar, multi-echo pulse sequences of the brain and surrounding structures were acquired without intravenous contrast. Angiographic images of the Circle of Willis were acquired using MRA  technique without intravenous contrast. COMPARISON:  No pertinent prior exam. FINDINGS: MRI HEAD FINDINGS Brain: No acute infarct, mass effect or extra-axial collection. No acute or chronic hemorrhage. Normal white matter signal, parenchymal volume and CSF spaces. The midline structures are normal. Vascular: Major flow voids are preserved. Skull and upper cervical spine: Normal calvarium and skull base. Visualized upper cervical spine and soft tissues are normal. Sinuses/Orbits:No paranasal sinus fluid levels or advanced mucosal thickening. No mastoid or middle ear effusion. Normal orbits. MRA HEAD FINDINGS POSTERIOR CIRCULATION: --Vertebral arteries: Normal --Inferior cerebellar arteries: Normal. --Basilar artery: Normal. --Superior cerebellar arteries: Normal. --Posterior cerebral arteries: Normal. ANTERIOR CIRCULATION: --Intracranial internal carotid arteries: Normal. --Anterior cerebral arteries (ACA): Normal. --Middle cerebral arteries (MCA): Normal. ANATOMIC VARIANTS: None IMPRESSION: Normal MRI/MRA of the brain. Electronically Signed   By: Ulyses Jarred M.D.   On: 08/08/2020 02:39   MR BRAIN WO CONTRAST  Result Date: 08/08/2020 CLINICAL DATA:  Abnormal right-sided sensations during pregnancy. EXAM: MRI HEAD WITHOUT CONTRAST MRA HEAD WITHOUT CONTRAST TECHNIQUE: Multiplanar, multi-echo pulse sequences of the brain and surrounding structures were acquired without intravenous contrast. Angiographic images of the Circle of Willis were acquired using MRA technique without intravenous contrast. COMPARISON:  No pertinent prior exam. FINDINGS: MRI HEAD FINDINGS Brain: No acute infarct, mass effect or extra-axial collection. No acute or chronic hemorrhage. Normal white matter signal, parenchymal volume and CSF spaces. The midline structures are normal. Vascular: Major flow voids are preserved. Skull and upper cervical spine: Normal calvarium and skull base. Visualized upper cervical spine and soft tissues are  normal. Sinuses/Orbits:No paranasal sinus fluid levels or advanced mucosal thickening. No mastoid or middle ear effusion. Normal orbits. MRA HEAD FINDINGS POSTERIOR CIRCULATION: --Vertebral arteries: Normal --Inferior cerebellar arteries: Normal. --Basilar artery: Normal. --Superior cerebellar arteries: Normal. --Posterior cerebral arteries: Normal. ANTERIOR CIRCULATION: --Intracranial internal carotid arteries: Normal. --Anterior cerebral arteries (ACA): Normal. --Middle cerebral arteries (MCA): Normal. ANATOMIC VARIANTS: None IMPRESSION: Normal MRI/MRA of the brain. Electronically Signed   By: Ulyses Jarred M.D.   On: 08/08/2020 02:39   DG Chest Portable 1 View  Result Date: 07/27/2020 CLINICAL DATA:  Pt arrives to ED with c/o of rapid heart rate. Pt reports laying on her left side 30 minutes ago when her left arm felt weak and numb. Pt then reports sudden onset of chest tightness and  shortness of breath. Pt reports inability to breath deeply. Pt is diaphoretic and c/o of abdominal cramping. Pt reports no vaginal bleeding. Pt is [redacted] weeks pregnant with first child. Pt is anemic but has not taken her iron in two weeks. No fevers or chills. EXAM: PORTABLE CHEST 1 VIEW COMPARISON:  05/14/2012 FINDINGS: Cardiac silhouette is normal in size and configuration. Normal mediastinal and hilar contours. Clear lungs.  No convincing pleural effusion or pneumothorax. Skeletal structures are grossly intact. IMPRESSION: No active disease. Electronically Signed   By: Lajean Manes M.D.   On: 07/27/2020 15:52   CT HEAD CODE STROKE WO CONTRAST  Result Date: 08/07/2020 CLINICAL DATA:  Code stroke.  Right arm weakness EXAM: CT HEAD WITHOUT CONTRAST TECHNIQUE: Contiguous axial images were obtained from the base of the skull through the vertex without intravenous contrast. COMPARISON:  None. FINDINGS: Brain: No acute intracranial hemorrhage, mass effect, or edema. Gray-white differentiation is preserved. No extra-axial collection.  Ventricles and sulci are normal in size and configuration. Vascular: No hyperdense vessel. Question of bulbous prominence of the basilar tip. Skull: Unremarkable. Sinuses/Orbits: No acute abnormality. Other: The sella is partially empty.  Mastoid air cells are clear. ASPECTS (Thorn[place] Stroke Program Early CT Score) - Ganglionic level infarction (caudate, lentiform nuclei, internal capsule, insula, M1-M3 cortex): 7 - Supraganglionic infarction (M4-M6 cortex): 3 Total score (0-10 with 10 being normal): 10 IMPRESSION: There is no acute intracranial hemorrhage or evidence of acute infarction. ASPECT score is 10. Question of bulbous prominence of the basilar tip. May reflect turning of the vessel, but nonemergent MRA or CTA recommended to exclude aneurysm. Partially empty sella, a nonspecific finding that can be seen in setting of idiopathic intracranial hypertension. These results were called by telephone at the time of interpretation on 08/07/2020 at 7:43 pm to provider [facility] , who verbally acknowledged these results. Electronically Signed   By: Macy Mis M.D.   On: 08/07/2020 19:45    MRI/MRA negative.  Patient states she feels back to baseline.  She has been up and walking here in the ED without issue.  VS remain stable.  Per neurology notes, felt to be stable for discharge with close OP neurology follow-up if MRI's reassuring.  Patient is comfortable with this plan.  Referral to neurology sent.  She will return here emergently for any new/acute changes.   Larene Pickett, PA-C 08/08/20 1103    Merrily Pew, MD 08/08/20 [phone]

## 2020-08-07 NOTE — ED Notes (Signed)
Report given to charge RN Claiborne Billings at [facility]

## 2020-08-07 NOTE — ED Notes (Signed)
[facility] at bedside 

## 2020-08-07 NOTE — Consult Note (Signed)
[facility] TeleNeurology Consult Services    Date of Service:  08/07/2020    Impression: 30 year old supermorbidly obese pregnant female presenting with acute onset of right arm heaviness and right foot numbness with tingling and swelling, NIHSS of 0. 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 Due to pregnancy, CT contrast should be avoided per the literature. Will need to have MRA of head at [facility] to further assess. Although NIHSS is 0, her symptoms could be due to a small left thalamic stroke as well and MRI brain should also be obtained. If she is unable to fit in the scanner, will need to be sent to an outpatient facility with open MRI capability within one week, for MRI brain and MRA head.    Metrics: Last Known Well: 1400 Symptoms: As per HPI.  Patient is not a candidate for thrombolytic. NIHSS of 0.    Location of the provider: [facility]  Location of the patient: [place] ED Pre-Morbid Modified Rankin Scale: 0  This consult was provided via telemedicine with 2-way video and audio communication. The patient/family was informed that care would be provided in this way and agreed to receive care in this manner.   ED Physician notified of diagnostic impression and management plan at: 8:00 PM   Assessment: 30 year old supermorbidly obese pregnant female presenting with acute onset of right arm heaviness and right foot numbness with tingling and swelling  -Exam reveals no focal deficit. NIHSS of 0.  - CT head: There is no acute intracranial hemorrhage or evidence of acute infarction. ASPECT score is 10. Question of bulbous prominence of the basilar tip. May reflect turning of the vessel, but nonemergent MRA or CTA recommended to exclude aneurysm.   Partially empty sella, a nonspecific finding that can be seen in setting of idiopathic intracranial hypertension. - She is 16 weeks and 5 days pregnant     Recommendations: - Will need to be  transferred to [facility] ED for MRI brain and MRA of head. Rule out basilar tip aneurysm and stroke. - Frequent neuro checks.  - Close monitoring of vitals.  - Outpatient Neurology and OB follow ups.  - BP management. Low risk of presentation being due to stroke. Most likely due to transient compression neuropathy of RLE in the context of supermorbid obesity. RUE symptoms of heaviness slightly more concerning, but no weakness seen on exam. Therefore, risks of permissive HTN in this pregnant patient are felt to significantly outweigh potential benefits.  - Discussed with MRI tech at [facility] and with EDP at [place] to admit versus discharge home will be determined by MRI results.       ------------------------------------------------------------------------------   History of Present Illness: 30 year old supermorbidly obese female, [redacted] weeks pregnant, who presented to the [place] ED with heaviness to right arm and right leg. She also had tingling in the right leg. She stated "my right leg just doesn't feel right". .First noticed at 2 PM when leg felt like it was aslieep with weakness and tingling, she stood up and "tapped it out" with resolution of symptoms, but she then noticed that her right foot was swollen and tingling. She then noticed that her right arm felt heavy, as though she had been working out - but she had not worked out recently. Her arm was tingling, but this has resolved and she currently has only has the heavy sensation. No facial weakness or numbness. No difficulty with speech  or vision. No CP, SOB or abdominal pain.   Currently, her symptoms consist of right arm heaviness and right foot tingling with swelling.    Was in the ED about 10 days ago for evaluation of tachycardia.    LKN 2 PM   Past Medical History: Past Medical History:  Diagnosis Date   Anemia    Hypertension   Supermorbid obesity    Past Surgical History: Past Surgical History:   Procedure Laterality Date   TOOTH EXTRACTION        Medications:  No current facility-administered medications on file prior to encounter.   Current Outpatient Medications on File Prior to Encounter  Medication Sig Dispense Refill   aspirin EC 81 MG tablet Take 1 tablet (81 mg total) by mouth daily. Swallow whole. 30 tablet 6   Ferrous Sulfate 220 (44 Fe) MG/5ML SOLN Take 5 mLs by mouth every other day. 200 mL 2   labetalol (NORMODYNE) 200 MG tablet Take 1 tablet (200 mg total) by mouth 2 (two) times daily. 60 tablet 3   NIFEdipine (ADALAT CC) 60 MG 24 hr tablet Take by mouth.     Prenatal MV-Min-Fe Fum-FA-DHA (PRENATAL 1 PO) Take by mouth. gummies     Blood Pressure Monitoring (BLOOD PRESSURE CUFF) MISC Use as directed for checking bp daily. 1 each 0   potassium chloride (KLOR-CON) 10 MEQ tablet Take 1 tablet (10 mEq total) by mouth 2 (two) times daily. 10 tablet 0   PROCARDIA XL 60 MG 24 hr tablet Take one tablet every 12 hours - twice daily 60 tablet 1   Vitamin D, Ergocalciferol, (DRISDOL) 1.25 MG (50000 UNIT) CAPS capsule TAKE 1 CAPSULE (50,000 UNITS TOTAL) BY MOUTH EVERY 7 (SEVEN) DAYS. 12 capsule 0       Social History: Drug Use:  Social History   Socioeconomic History   Marital status: Single    Spouse name: Not on file   Number of children: Not on file   Years of education: Not on file   Highest education level: Not on file  Occupational History   Not on file  Tobacco Use   Smoking status: Never   Smokeless tobacco: Never  Substance and Sexual Activity   Alcohol use: Not Currently   Drug use: No   Sexual activity: Not Currently    Birth control/protection: None  Other Topics Concern   Not on file  Social History Narrative   Not on file   Social Determinants of Health   Financial Resource Strain: Not on file  Food Insecurity: Not on file  Transportation Needs: Not on file  Physical Activity: Not on file  Stress: Not on file  Social Connections: Not on  file  Intimate Partner Violence: Not on file      Family History:  Reviewed in Epic   ROS: As per HPI    Anticoagulant use:  No   Antiplatelet use: Yes  BP 138/89 (BP Location: Right Wrist)   Pulse 94   Temp 98 F (36.7 C) (Oral)   Resp (!) 22   Ht 5\' 5"  (1.651 m)   Wt (!) 196.4 kg   LMP 04/13/2020 (Exact Date)   SpO2 100%   BMI 72.05 kg/m    Examination:    1A: Level of Consciousness - 0 1B: Ask Month and Age - 0 1C: Blink Eyes & Squeeze Hands - 0 2: Test Horizontal Extraocular Movements - 0 3: Test Visual Fields - 0 4: Test Facial Palsy (Use Grimace  if Obtunded) - 0 5A: Test Left Arm Motor Drift - 0 5B: Test Right Arm Motor Drift - 0 6A: Test Left Leg Motor Drift - 0 6B: Test Right Leg Motor Drift - 0 7: Test Limb Ataxia (FNF/Heel-Shin) - 0 8: Test Sensation -  0 9: Test Language/Aphasia - 0 10: Test Dysarthria - Severe Dysarthria: 0 11: Test Extinction/Inattention - Extinction to bilateral simultaneous stimulation 0   NIHSS Score: 0     Patient/Family was informed the Neurology Consult would occur via TeleHealth consult by way of interactive audio and video telecommunications and consented to receiving care in this manner.   Patient is being evaluated for possible acute neurologic impairment and high pretest probability of imminent or life-threatening deterioration. I spent total of 40 minutes providing care to this patient, including time for face to face visit via telemedicine, review of medical records, imaging studies and discussion of findings with providers, the patient and/or family.   Electronically signed: Dr. Kerney Elbe

## 2020-08-07 NOTE — ED Notes (Signed)
CBG 91 

## 2020-08-07 NOTE — ED Notes (Signed)
Called [facility] to transport patient to [facility] emergency

## 2020-08-07 NOTE — ED Triage Notes (Signed)
Pt arrived via [facility] from [place] for MRI for concern of possible basilar artery aneurysm. Pt [redacted] weeks pregnant, P1G0A0, due date 01/18/21. Pt was sent to ED from OBGYN for numbness/tingling in hands/feet, hypertension, extremity swelling. Imaging completed at this facility warranted transfer for further imaging.    A&Ox4, GCS15, ambulates w/o assistance.

## 2020-08-07 NOTE — ED Triage Notes (Signed)
Pt adds that she also has weakness in her right arm and paining her right foot due to edema. Pt states that it is "sometimes normal" for her to have some swelling in her lower extremities

## 2020-08-07 NOTE — ED Notes (Signed)
Care Handoff report given to [facility].

## 2020-08-07 NOTE — ED Notes (Signed)
To CT with RN Zenia Resides

## 2020-08-07 NOTE — ED Triage Notes (Signed)
Pt is [redacted] weeks pregnant and was sent by per OB related hypertension and possible pre eclampsia.

## 2020-08-07 NOTE — ED Provider Notes (Signed)
[facility] EMERGENCY DEPT Provider Note   CSN: [id number] Arrival date & time: 08/07/20  1734  An emergency department physician performed an initial assessment on this suspected stroke patient at 71.  History Chief Complaint  Patient presents with   Hypertension    Shelby Chandler is a 30 y.o. female.  Patient is [redacted] weeks pregnant.  Presents to ER chief complaint of right arm heaviness and right leg tingling sensation.  Symptoms started around 2 PM.  Patient says she was at rest when symptoms initiated.  They have not improved.  She called her doctor who told her to go to the ER for evaluation.  She has a history of hypertension, continued hypertension during pregnancy and is on multiple medications for her high blood pressure.  She otherwise denies any fevers or cough or vomiting or diarrhea.      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   Anxiety and depression 08/28/2017   Chronic low back pain without sciatica 08/28/2017   History of anemia 08/28/2017   Essential hypertension 01/09/2017   Iron deficiency anemia due to chronic blood loss 01/09/2017   Class 3 severe obesity due to excess calories without serious comorbidity with body mass index (BMI) of 60.0 to 69.9 in adult [facility]) 01/09/2017    Past Surgical History:  Procedure Laterality Date   TOOTH EXTRACTION       OB History     Gravida  1   Para  0   Term  0   Preterm      AB  0   Living         SAB  0   IAB  0   Ectopic  0   Multiple  0   Live Births              Family History  Problem Relation Age of Onset   Alcohol abuse Mother    Arthritis Mother    Depression Mother    Drug abuse Mother    Heart attack Mother     Hypertension Mother    Hyperlipidemia Mother    Alcohol abuse Father    Diabetes Father    Drug abuse Father    Hypertension Father    Heart attack Father    Stroke Father    Hypertension Sister    Arthritis Maternal Grandmother    Arthritis Maternal Grandfather    Diabetes Paternal Grandmother     Social History   Tobacco Use   Smoking status: Never   Smokeless tobacco: Never  Substance Use Topics   Alcohol use: Not Currently   Drug use: No    Home Medications Prior to Admission medications   Medication Sig Start Date End Date Taking? Authorizing Provider  aspirin EC 81 MG tablet Take 1 tablet (81 mg total) by mouth daily. Swallow whole. 07/29/20  Yes Nugent, Gerrie Nordmann, NP  Ferrous Sulfate 220 (44 Fe) MG/5ML SOLN Take 5 mLs by mouth every other day. 11/05/19  Yes Billie Ruddy, MD  labetalol (NORMODYNE) 200 MG tablet Take 1 tablet (200 mg total) by mouth 2 (two) times daily. 07/29/20  Yes Nugent, Gerrie Nordmann, NP  NIFEdipine (ADALAT CC) 60 MG 24 hr tablet Take by  mouth. 07/17/20  Yes [provider]  Prenatal MV-Min-Fe Fum-FA-DHA (PRENATAL 1 PO) Take by mouth. gummies   Yes [provider]  Blood Pressure Monitoring (BLOOD PRESSURE CUFF) MISC Use as directed for checking bp daily. 07/19/19   Billie Ruddy, MD  potassium chloride (KLOR-CON) 10 MEQ tablet Take 1 tablet (10 mEq total) by mouth 2 (two) times daily. 07/27/20   Davonna Belling, MD  PROCARDIA XL 60 MG 24 hr tablet Take one tablet every 12 hours - twice daily 07/17/20   Burleson, Terri L, NP  Vitamin D, Ergocalciferol, (DRISDOL) 1.25 MG (50000 UNIT) CAPS capsule TAKE 1 CAPSULE (50,000 UNITS TOTAL) BY MOUTH EVERY 7 (SEVEN) DAYS. 02/03/20   Billie Ruddy, MD    Allergies    Patient has no known allergies.  Review of Systems   Review of Systems  Constitutional:  Negative for fever.  HENT:  Negative for ear pain.   Eyes:  Negative for pain.  Respiratory:  Negative for cough.   Cardiovascular:   Negative for chest pain.  Gastrointestinal:  Negative for abdominal pain.  Genitourinary:  Negative for flank pain.  Musculoskeletal:  Negative for back pain.  Skin:  Negative for rash.  Neurological:  Negative for headaches.   Physical Exam Updated Vital Signs BP (!) 143/71 (BP Location: Left Wrist)   Pulse 91   Temp 98 F (36.7 C) (Oral)   Resp (!) 22   Ht 5\' 5"  (1.651 m)   Wt (!) 196.4 kg   LMP 04/13/2020 (Exact Date)   SpO2 100%   BMI 72.05 kg/m   Physical Exam Constitutional:      General: She is not in acute distress.    Appearance: Normal appearance.  HENT:     Head: Normocephalic.     Nose: Nose normal.  Eyes:     Extraocular Movements: Extraocular movements intact.  Cardiovascular:     Rate and Rhythm: Normal rate.  Pulmonary:     Effort: Pulmonary effort is normal.  Musculoskeletal:        General: Normal range of motion.     Cervical back: Normal range of motion.  Neurological:     General: No focal deficit present.     Mental Status: She is alert and oriented to person, place, and time. Mental status is at baseline.     Comments: No strength deficit noted on exam.  No drift of upper or lower extremities noted.  No facial asymmetry noted no slurred speech noted.  Cranial nerves II through XII intact.  Strength is 5/5 all extremities.    ED Results / Procedures / Treatments   Labs (all labs ordered are listed, but only abnormal results are displayed) Labs Reviewed  CBC - Abnormal; Notable for the following components:      Result Value   Hemoglobin 10.3 (*)    HCT 32.6 (*)    MCV 73.1 (*)    MCH 23.1 (*)    RDW 16.9 (*)    Platelets 466 (*)    All other components within normal limits  COMPREHENSIVE METABOLIC PANEL - Abnormal; Notable for the following components:   Potassium 3.3 (*)    CO2 20 (*)    Total Bilirubin 0.2 (*)    All other components within normal limits  URINALYSIS, ROUTINE W REFLEX MICROSCOPIC - Abnormal; Notable for the following  components:   Leukocytes,Ua MODERATE (*)    Bacteria, UA MANY (*)    All other components within normal limits  PREGNANCY, URINE - Abnormal; Notable for the following components:   Preg Test, Ur POSITIVE (*)    All other components within normal limits  RESP PANEL BY RT-PCR (FLU A&B, COVID) ARPGX2  ETHANOL  PROTIME-INR  APTT  DIFFERENTIAL  RAPID URINE DRUG SCREEN, HOSP PERFORMED  CBG MONITORING, ED    EKG None  Radiology CT HEAD CODE STROKE WO CONTRAST  Result Date: 08/07/2020 CLINICAL DATA:  Code stroke.  Right arm weakness EXAM: CT HEAD WITHOUT CONTRAST TECHNIQUE: Contiguous axial images were obtained from the base of the skull through the vertex without intravenous contrast. COMPARISON:  None. FINDINGS: Brain: No acute intracranial hemorrhage, mass effect, or edema. Gray-white differentiation is preserved. No extra-axial collection. Ventricles and sulci are normal in size and configuration. Vascular: No hyperdense vessel. Question of bulbous prominence of the basilar tip. Skull: Unremarkable. Sinuses/Orbits: No acute abnormality. Other: The sella is partially empty.  Mastoid air cells are clear. ASPECTS ([place] Stroke Program Early CT Score) - Ganglionic level infarction (caudate, lentiform nuclei, internal capsule, insula, M1-M3 cortex): 7 - Supraganglionic infarction (M4-M6 cortex): 3 Total score (0-10 with 10 being normal): 10 IMPRESSION: There is no acute intracranial hemorrhage or evidence of acute infarction. ASPECT score is 10. Question of bulbous prominence of the basilar tip. May reflect turning of the vessel, but nonemergent MRA or CTA recommended to exclude aneurysm. Partially empty sella, a nonspecific finding that can be seen in setting of idiopathic intracranial hypertension. These results were called by telephone at the time of interpretation on 08/07/2020 at 7:43 pm to provider [facility] , who verbally acknowledged these results. Electronically Signed   By: Macy Mis  M.D.   On: 08/07/2020 19:45    Procedures Procedures   Medications Ordered in ED Medications - No data to display  ED Course  I have reviewed the triage vital signs and the nursing notes.  Pertinent labs & imaging results that were available during my care of the patient were reviewed by me and considered in my medical decision making (see chart for details).    MDM Rules/Calculators/A&P                          Patient has a normal neuro exam but she has persistent sensation of heaviness in the right upper extremity and tingling sensation in the right lower extremity.  Given these findings stroke alert was activated.  She is not a candidate for tPA given onset of last known well was greater than 6 hours prior to initiation of stroke alert.  CT imaging concerning for possible aneurysm versus artifact.  Patient will be sent to [facility] for further imaging.  Final Clinical Impression(s) / ED Diagnoses Final diagnoses:  Stroke-like symptom    Rx / DC Orders ED Discharge Orders     None        Luna Fuse, MD 08/07/20 2044

## 2020-08-07 NOTE — Telephone Encounter (Signed)
Call to patient to follow up, she sent MyChart message stating she had an elevated BP.  Had patient repeat BP on her home device.  BP was 160/103.  Patient has been taking her Labetalol and baby aspirin as directed.  Patient denies any visual changes or headache but states she has some swelling in her leg.   Advised patient to go to [facility] at [name] entrance C.   Message routed to provider on call.

## 2020-08-08 ENCOUNTER — Emergency Department (HOSPITAL_COMMUNITY): Payer: Managed Care, Other (non HMO)

## 2020-08-08 NOTE — ED Notes (Signed)
Pt reports that she feels much better and that the heaviness and the tingling has went away

## 2020-08-08 NOTE — ED Notes (Signed)
Patient verbalizes understanding of discharge instructions. Opportunity for questioning and answers were provided. Armband removed by staff, pt discharged from ED ambulatory.   

## 2020-08-08 NOTE — ED Notes (Signed)
Pt in MRI.

## 2020-08-08 NOTE — Discharge Instructions (Addendum)
MRI/MRA today was normal.   Recommend close follow-up with neurology.  I have sent formal referral but if you do not hear from them in a timely fashion, please call to confirm appt. Return to the ED for any recurrent or new symptoms-- dizziness, severe headache, confusion, numbness, weakness

## 2020-08-08 NOTE — ED Notes (Signed)
Pt back from MRI - pt ambulatory to restroom

## 2020-08-11 ENCOUNTER — Encounter: Payer: Self-pay | Admitting: Women's Health

## 2020-08-12 ENCOUNTER — Other Ambulatory Visit: Payer: Self-pay

## 2020-08-12 ENCOUNTER — Ambulatory Visit (INDEPENDENT_AMBULATORY_CARE_PROVIDER_SITE_OTHER): Payer: Managed Care, Other (non HMO)

## 2020-08-12 DIAGNOSIS — Z0130 Encounter for examination of blood pressure without abnormal findings: Secondary | ICD-10-CM

## 2020-08-12 NOTE — Progress Notes (Signed)
Subjective:  Shelby Chandler is a 30 y.o. OB [redacted]w[redacted]d here for BP check.   Hypertension ROS: taking medications as instructed, no medication side effects noted, no TIA's, no dyspnea on exertion, noting swelling of ankles, palpitations described as beating fast, and headaches 6/10 .    Objective:  BP 115/75 (BP Location: Left Arm, Cuff Size: Large) Comment (Cuff Size): THIGH CUFF  Pulse (!) 101   Ht 5\' 5"  (1.651 m)   Wt (!) 430 lb (195 kg)   LMP 04/13/2020 (Exact Date)   BMI 71.56 kg/m   Appearance alert, well appearing, and in no distress and overweight. General exam BP noted to be well controlled today in office.    Assessment:   Blood Pressure stable.   Plan:  Current treatment plan is effective, no change in therapy.

## 2020-08-13 NOTE — Progress Notes (Signed)
Patient was assessed and managed by nursing staff during this encounter. I have reviewed the chart and agree with the documentation and plan. I have also made any necessary editorial changes.  Mora Bellman, MD 08/13/2020 10:18 AM

## 2020-08-14 ENCOUNTER — Ambulatory Visit: Payer: Managed Care, Other (non HMO) | Admitting: Cardiology

## 2020-08-14 ENCOUNTER — Encounter: Payer: Self-pay | Admitting: Cardiology

## 2020-08-14 ENCOUNTER — Ambulatory Visit (INDEPENDENT_AMBULATORY_CARE_PROVIDER_SITE_OTHER): Payer: Managed Care, Other (non HMO)

## 2020-08-14 ENCOUNTER — Other Ambulatory Visit: Payer: Self-pay

## 2020-08-14 VITALS — BP 159/87 | HR 83 | Ht 65.0 in | Wt >= 6400 oz

## 2020-08-14 DIAGNOSIS — I10 Essential (primary) hypertension: Secondary | ICD-10-CM

## 2020-08-14 DIAGNOSIS — R002 Palpitations: Secondary | ICD-10-CM | POA: Diagnosis not present

## 2020-08-14 MED ORDER — NIFEDIPINE ER OSMOTIC RELEASE 90 MG PO TB24: 90.0000 mg | ORAL_TABLET | Freq: Every day | ORAL | 3 refills | Status: DC

## 2020-08-14 MED ORDER — PROPRANOLOL HCL 40 MG PO TABS
40.0000 mg | ORAL_TABLET | Freq: Two times a day (BID) | ORAL | 3 refills | Status: DC
Start: 2020-08-14 — End: 2020-09-11

## 2020-08-14 NOTE — Patient Instructions (Addendum)
Medication Instructions:  Your physician has recommended you make the following change in your medication:  STOP: Labetalol START: Propranolol 40 mg twice daily INCREASE: Procardia XL 90 mg once daily  *If you need a refill on your cardiac medications before your next appointment, please call your pharmacy*   Lab Work: None If you have labs (blood work) drawn today and your tests are completely normal, you will receive your results only by: [place] (if you have MyChart) OR A paper copy in the mail If you have any lab test that is abnormal or we need to change your treatment, we will call you to review the results.   Testing/Procedures: A zio monitor was ordered today. It will remain on for 14 days. You will then return monitor and event diary in provided box. It takes 1-2 weeks for report to be downloaded and returned to [place]. We will call you with the results. If monitor falls off or has orange flashing light, please call Zio for further instructions.     Follow-Up: At [facility], you and your health needs are our priority.  As part of our continuing mission to provide you with exceptional heart care, we have created designated Provider Care Teams.  These Care Teams include your primary Cardiologist (physician) and Advanced Practice Providers (APPs -  Physician Assistants and Nurse Practitioners) who all work together to provide you with the care you need, when you need it.  We recommend signing up for the patient portal called "MyChart".  Sign up information is provided on this After Visit Summary.  MyChart is used to connect with patients for Virtual Visits (Telemedicine).  Patients are able to view lab/test results, encounter notes, upcoming appointments, etc.  Non-urgent messages can be sent to your provider as well.   To learn more about what you can do with MyChart, go to [email or web address].    Your next appointment:   August 5  The format for your next  appointment:   In Person  Provider:   Berniece Salines, DO   Other Instructions Please take your blood pressure daily and record it. Bring with you to your next appointment.  ZIO  WHY IS MY DOCTOR PRESCRIBING ZIO? The Zio system is proven and trusted by physicians to detect and diagnose irregular heart rhythms -- and has been prescribed to hundreds of thousands of patients.  The FDA has cleared the Zio system to monitor for many different kinds of irregular heart rhythms. In a study, physicians were able to reach a diagnosis 90% of the time with the Zio system1.  You can wear the Zio monitor -- a small, discreet, comfortable patch -- during your normal day-to-day activity, including while you sleep, shower, and exercise, while it records every single heartbeat for analysis.  1Barrett, P., et al. Comparison of 24 Hour Holter Monitoring Versus 14 Day Novel Adhesive Patch Electrocardiographic Monitoring. [place], 2014.  ZIO VS. HOLTER MONITORING The Zio monitor can be comfortably worn for up to 14 days. Holter monitors can be worn for 24 to 48 hours, limiting the time to record any irregular heart rhythms you may have. Zio is able to capture data for the 51% of patients who have their first symptom-triggered arrhythmia after 48 hours.1  LIVE WITHOUT RESTRICTIONS The Zio ambulatory cardiac monitor is a small, unobtrusive, and water-resistant patch--you might even forget you're wearing it. The Zio monitor records and stores every beat of your heart, whether you're sleeping, working out, or showering. Remove  on: July 22nd

## 2020-08-14 NOTE — Progress Notes (Signed)
Cardio-Obstetrics Clinic  New Evaluation  Date:  08/14/2020   ID:  Shelby Chandler, DOB August 17, 1990, MRN [id number]  PCP:  Billie Ruddy, MD   [facility] Providers Cardiologist:  Berniece Salines, DO  Electrophysiologist:  None       Referring MD: Billie Ruddy, MD   Chief Complaint: " I have been having a lot of palpitations since I will start her on her labetalol"  History of Present Illness:    Shelby Chandler is a 30 y.o. female [G1P0000] who 70 weeks and 4 days pregnant is being seen today for the evaluation of hypertension in pregnancy and palpitations at the request of Billie Ruddy, MD.   Medical history includes morbid obesity, hypertension.  The patient tells me that since been started on her labetalol she has had significant palpitations and has visited the emergency department at least 2 times for this.  She notes that when she is at the emergency department she is told that underline anxiety may be playing a role but she is concerned.  He was in fact that she tells me every time she take her labetalol she get these episodes of palpitations for the last 2 days she has not taking her labetalol she stopped this medication.  She also is very teary in the office.  Prior CV Studies Reviewed: The following studies were reviewed today: None today  Past Medical History:  Diagnosis Date   Anemia    Hypertension     Past Surgical History:  Procedure Laterality Date   TOOTH EXTRACTION        OB History     Gravida  1   Para  0   Term  0   Preterm      AB  0   Living         SAB  0   IAB  0   Ectopic  0   Multiple  0   Live Births                  Current Medications: Current Meds  Medication Sig   aspirin EC 81 MG tablet Take 1 tablet (81 mg total) by mouth daily. Swallow whole.   Blood Pressure Monitoring (BLOOD PRESSURE CUFF) MISC Use as directed for checking bp daily.   Ferrous Sulfate 220 (44 Fe) MG/5ML SOLN Take  5 mLs by mouth every other day.   NIFEdipine (PROCARDIA XL) 90 MG 24 hr tablet Take 1 tablet (90 mg total) by mouth daily.   potassium chloride (KLOR-CON) 10 MEQ tablet Take 1 tablet (10 mEq total) by mouth 2 (two) times daily.   Prenatal MV-Min-Fe Fum-FA-DHA (PRENATAL 1 PO) Take by mouth. gummies   propranolol (INDERAL) 40 MG tablet Take 1 tablet (40 mg total) by mouth 2 (two) times daily.   Vitamin D, Ergocalciferol, (DRISDOL) 1.25 MG (50000 UNIT) CAPS capsule TAKE 1 CAPSULE (50,000 UNITS TOTAL) BY MOUTH EVERY 7 (SEVEN) DAYS.   [DISCONTINUED] labetalol (NORMODYNE) 200 MG tablet Take 1 tablet (200 mg total) by mouth 2 (two) times daily.   [DISCONTINUED] PROCARDIA XL 60 MG 24 hr tablet Take one tablet every 12 hours - twice daily     Allergies:   Patient has no known allergies.   Social History   Socioeconomic History   Marital status: Single    Spouse name: Not on file   Number of children: Not on file   Years of education: Not on file   Highest  education level: Not on file  Occupational History   Not on file  Tobacco Use   Smoking status: Never   Smokeless tobacco: Never  Vaping Use   Vaping Use: Never used  Substance and Sexual Activity   Alcohol use: Not Currently   Drug use: No   Sexual activity: Not Currently    Birth control/protection: None  Other Topics Concern   Not on file  Social History Narrative   Not on file   Social Determinants of Health   Financial Resource Strain: Low Risk    Difficulty of Paying Living Expenses: Not hard at all  Food Insecurity: No Food Insecurity   Worried About Charity fundraiser in the Last Year: Never true   [place] in the Last Year: Never true  Transportation Needs: No Transportation Needs   Lack of Transportation (Medical): No   Lack of Transportation (Non-Medical): No  Physical Activity: Not on file  Stress: Stress Concern Present   Feeling of Stress : To some extent  Social Connections: Unknown   Frequency of  Communication with Friends and Family: More than three times a week   Frequency of Social Gatherings with Friends and Family: Not on file   Attends Religious Services: Not on Electrical engineer or Organizations: Not on file   Attends Archivist Meetings: Not on file   Marital Status: Not on file      Family History  Problem Relation Age of Onset   Alcohol abuse Mother    Arthritis Mother    Depression Mother    Drug abuse Mother    Heart attack Mother    Hypertension Mother    Hyperlipidemia Mother    Alcohol abuse Father    Diabetes Father    Drug abuse Father    Hypertension Father    Heart attack Father    Stroke Father    Hypertension Sister    Arthritis Maternal Grandmother    Arthritis Maternal Grandfather    Diabetes Paternal Grandmother       ROS:   Please see the history of present illness.     All other systems reviewed and are negative.   Labs/EKG Reviewed:    EKG:   EKG was ordered today.  The ekg ordered today demonstrates sinus rhythm, HR 83 bpm.  Recent Labs: 08/07/2020: ALT 25; BUN 7; Creatinine, Ser 0.81; Hemoglobin 10.3; Platelets 466; Potassium 3.3; Sodium 135   Recent Lipid Panel Lab Results  Component Value Date/Time   CHOL 174 01/09/2017 12:01 PM   TRIG 72 01/09/2017 12:01 PM   HDL 51 01/09/2017 12:01 PM   CHOLHDL 3.4 01/09/2017 12:01 PM   LDLCALC 109 (H) 01/09/2017 12:01 PM    Physical Exam:    VS:  BP (!) 159/87 (BP Location: Right Wrist, Patient Position: Sitting, Cuff Size: Large)   Pulse 83   Ht 5\' 5"  (1.651 m)   Wt (!) 431 lb (195.5 kg)   LMP 04/13/2020 (Exact Date)   SpO2 98%   BMI 71.72 kg/m     Wt Readings from Last 3 Encounters:  08/14/20 (!) 431 lb (195.5 kg)  08/12/20 (!) 430 lb (195 kg)  08/07/20 (!) 433 lb (196.4 kg)     GEN:  Well nourished, well developed in no acute distress HEENT: Normal NECK: No JVD; No carotid bruits LYMPHATICS: No lymphadenopathy CARDIAC: RRR, no murmurs, rubs,  gallops RESPIRATORY:  Clear to auscultation without rales, wheezing or  rhonchi  ABDOMEN: Soft, non-tender, non-distended MUSCULOSKELETAL:  No edema; No deformity  SKIN: Warm and dry NEUROLOGIC:  Alert and oriented x 3 PSYCHIATRIC:  Normal affect    Risk Assessment/Risk Calculators:     CARPREG II Risk Prediction Index Score:  1.  The patient's risk for a primary cardiac event is 5%.           ASSESSMENT & PLAN:    Chronic Hypertension in pregnancy Morbid obesity Palpitations  Her palpitations she tells me get worse with the Labetalol which usually it should be the opposite effect for this beta blocker. She stopped taking her labetalol for the last two day and the palpitations resolved.  For now will place a monitor on the patient to rule out any significant arrhythmia.   She is hypertensive - prefers to stay of labetalol. I will stop the labetalol, increase her Procardia XL 90 mg daily and add propanolol 40 mg twice a day.  She will take her blood pressure daily for the next week and send that information to me via mychart.   In terms of her weight loss, I have enough the patient to continue to exercise and avoid gaining more than 20 lbs during her pregnancy.   She does have some underlying anxiety and depression - she follows with her Counsellor.   Patient Instructions  Medication Instructions:  Your physician has recommended you make the following change in your medication:  STOP: Labetalol START: Propranolol 40 mg twice daily INCREASE: Procardia XL 90 mg once daily  *If you need a refill on your cardiac medications before your next appointment, please call your pharmacy*   Lab Work: None If you have labs (blood work) drawn today and your tests are completely normal, you will receive your results only by: [place] (if you have MyChart) OR A paper copy in the mail If you have any lab test that is abnormal or we need to change your treatment, we will call you  to review the results.   Testing/Procedures: A zio monitor was ordered today. It will remain on for 14 days. You will then return monitor and event diary in provided box. It takes 1-2 weeks for report to be downloaded and returned to [place]. We will call you with the results. If monitor falls off or has orange flashing light, please call Zio for further instructions.     Follow-Up: At [facility], you and your health needs are our priority.  As part of our continuing mission to provide you with exceptional heart care, we have created designated Provider Care Teams.  These Care Teams include your primary Cardiologist (physician) and Advanced Practice Providers (APPs -  Physician Assistants and Nurse Practitioners) who all work together to provide you with the care you need, when you need it.  We recommend signing up for the patient portal called "MyChart".  Sign up information is provided on this After Visit Summary.  MyChart is used to connect with patients for Virtual Visits (Telemedicine).  Patients are able to view lab/test results, encounter notes, upcoming appointments, etc.  Non-urgent messages can be sent to your provider as well.   To learn more about what you can do with MyChart, go to [email or web address].    Your next appointment:   August 5  The format for your next appointment:   In Person  Provider:   Berniece Salines, DO   Other Instructions Please take your blood pressure daily and record it. Bring with you to your  next appointment.  ZIO  WHY IS MY DOCTOR PRESCRIBING ZIO? The Zio system is proven and trusted by physicians to detect and diagnose irregular heart rhythms -- and has been prescribed to hundreds of thousands of patients.  The FDA has cleared the Zio system to monitor for many different kinds of irregular heart rhythms. In a study, physicians were able to reach a diagnosis 90% of the time with the Zio system1.  You can wear the Zio monitor -- a small, discreet,  comfortable patch -- during your normal day-to-day activity, including while you sleep, shower, and exercise, while it records every single heartbeat for analysis.  1Barrett, P., et al. Comparison of 24 Hour Holter Monitoring Versus 14 Day Novel Adhesive Patch Electrocardiographic Monitoring. [place], 2014.  ZIO VS. HOLTER MONITORING The Zio monitor can be comfortably worn for up to 14 days. Holter monitors can be worn for 24 to 48 hours, limiting the time to record any irregular heart rhythms you may have. Zio is able to capture data for the 51% of patients who have their first symptom-triggered arrhythmia after 48 hours.1  LIVE WITHOUT RESTRICTIONS The Zio ambulatory cardiac monitor is a small, unobtrusive, and water-resistant patch--you might even forget you're wearing it. The Zio monitor records and stores every beat of your heart, whether you're sleeping, working out, or showering. Remove on: July 22nd   Dispo:  No follow-ups on file.   Medication Adjustments/Labs and Tests Ordered: Current medicines are reviewed at length with the patient today.  Concerns regarding medicines are outlined above.  Tests Ordered: Orders Placed This Encounter  Procedures   LONG TERM MONITOR (3-14 DAYS)   EKG 12-Lead   Medication Changes: Meds ordered this encounter  Medications   NIFEdipine (PROCARDIA XL) 90 MG 24 hr tablet    Sig: Take 1 tablet (90 mg total) by mouth daily.    Dispense:  90 tablet    Refill:  3   propranolol (INDERAL) 40 MG tablet    Sig: Take 1 tablet (40 mg total) by mouth 2 (two) times daily.    Dispense:  180 tablet    Refill:  3

## 2020-08-18 ENCOUNTER — Other Ambulatory Visit: Payer: Self-pay | Admitting: Nurse Practitioner

## 2020-08-24 ENCOUNTER — Ambulatory Visit: Payer: Managed Care, Other (non HMO) | Attending: Obstetrics and Gynecology

## 2020-08-24 ENCOUNTER — Encounter: Payer: Self-pay | Admitting: *Deleted

## 2020-08-24 ENCOUNTER — Other Ambulatory Visit: Payer: Self-pay

## 2020-08-24 ENCOUNTER — Ambulatory Visit: Payer: Managed Care, Other (non HMO) | Admitting: *Deleted

## 2020-08-24 ENCOUNTER — Ambulatory Visit (HOSPITAL_BASED_OUTPATIENT_CLINIC_OR_DEPARTMENT_OTHER): Payer: Managed Care, Other (non HMO) | Admitting: Obstetrics and Gynecology

## 2020-08-24 ENCOUNTER — Other Ambulatory Visit: Payer: Self-pay | Admitting: *Deleted

## 2020-08-24 VITALS — BP 134/95 | HR 87

## 2020-08-24 DIAGNOSIS — O10912 Unspecified pre-existing hypertension complicating pregnancy, second trimester: Secondary | ICD-10-CM | POA: Diagnosis present

## 2020-08-24 DIAGNOSIS — O99210 Obesity complicating pregnancy, unspecified trimester: Secondary | ICD-10-CM | POA: Diagnosis present

## 2020-08-24 DIAGNOSIS — O10012 Pre-existing essential hypertension complicating pregnancy, second trimester: Secondary | ICD-10-CM

## 2020-08-24 DIAGNOSIS — O99012 Anemia complicating pregnancy, second trimester: Secondary | ICD-10-CM | POA: Diagnosis present

## 2020-08-24 DIAGNOSIS — Z363 Encounter for antenatal screening for malformations: Secondary | ICD-10-CM | POA: Diagnosis present

## 2020-08-24 DIAGNOSIS — Z3A19 19 weeks gestation of pregnancy: Secondary | ICD-10-CM

## 2020-08-24 DIAGNOSIS — O10919 Unspecified pre-existing hypertension complicating pregnancy, unspecified trimester: Secondary | ICD-10-CM | POA: Insufficient documentation

## 2020-08-24 NOTE — Progress Notes (Signed)
Maternal-Fetal Medicine   Name: Shelby Chandler DOB: 1990/09/28 MRN: [id number] Referring Provider: Christophe Louis, MD   I had the pleasure of seeing Ms. Birt [facility] at [facility]. She is G1 P0 at 19-weeks' gestation and is here for fetal anatomy scan and consultation.  She was accompanied by her partner. Her problems include: -Chronic hypertension. -Palpitations -Class III obesity. -Anemia (Hb 10.3)  Past medical history is significant for chronic hypertension diagnosed in 2014 and the patient was taking amlodipine before pregnancy.  Patient has been having palpitations and was seen by her cardiologist on 08/14/20 by Dr. Harriet Masson.  She takes nifedipine XL 90 mg daily and propranolol 40 mg daily (for palpitations).  Her blood pressure today at her office is 134/95 mmHg.  Patient was advised to have a long-term cardiac monitor by her cardiologist and she reports it has fallen off recently.  She will be contacting her cardiologist's office. She does not have diabetes. On 08/07/2020, she was evaluated at [facility] emergency for complaints of right arm heaviness and right leg tingling sensation.  She was seen by a neurologist and no motor deficit was seen.  CT scan was reported as normal. Review of systems: No severe headaches or visual disturbances.  No chest pain.  Palpitations off and on.  No joint pains.  She complains of occasional back pain.  No history of recurrent urinary tract infections, no vaginal bleeding.  No nausea/vomiting/diarrhea/constipation. Past surgical history: Dental surgery. Medications: Prenatal vitamins, iron supplements, low-dose aspirin, propranolol, nifedipine, low-dose aspirin. Allergies: No known drug allergies. Social history: Denies tobacco or drug or alcohol use.  Her partner is African-American and he is in good health. GYN history: No history of abnormal Pap smears or cervical surgeries.  No history of breast disease. Prenatal course: On  cell free fetal DNA screening, fetal DNA was insufficient, and no result was reported.  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  Ultrasound We performed fetal anatomy scan.  No markers of aneuploidies or obvious fetal structural defects are seen.  Fetal biometry is consistent with the previously established dates.  Fetal anatomical survey appears normal but very limited by maternal body habitus. As obesity imposes limitations on the resolution of images and fetal anomalies may be missed.  Our concerns include Chronic hypertension in pregnancy Adverse effects in pregnancy correlate well with the severity of hypertension and the duration of hypertension. Maternal complications (in poorly-controlled hypertension) include stroke, pulmonary edema, and renal failure. Placental abruption fetal growth restriction, preterm delivery (indicated delivery in most cases), perinatal mortality, caesarean delivery, and postpartum hemorrhage are all increased. Superimposed preeclamspsia occurs in up to 30% of patients with chronic hypertension.  Most antihypertensives prescribed in pregnancy have established safety-profiles. Nifedipine is safe in pregnancy. No increased congenital malformations have been noted with this drug.  Propranolol can potentially cause fetal growth restriction and fetal growth should be monitored every 4 weeks. Neonatal bradycardia can occur and the neonate should be watched for 24 to 48 hours. Overall, maternal benefits appear to outweigh fetal risks. Fetal surveillance: We recommend serial growth scans to monitor fetal growth, and initiating weekly antepartum testing (BPP) from [redacted] weeks gestation until delivery. If hypertension is severe, early initiation and more frequent antenatal testing should be performed.  Timing of delivery: Consider delivery at 38 or 39 weeks. If her blood pressure is poorly-controlled, earlier delivery may be considered. Low-dose aspirin  reduces the likelihood of developing preeclampsia and should be continued  throughout her pregnancy. Patient has a follow-up visit with her cardiologist.  Maternal obesity: -Obesity is associated with increased risk of miscarriages, fetal congenital malformations including open neural tube defects and stillbirth.  Although there is an increase in stillbirth rate the absolute risk is very small (1-2 per 1,000 births) -Ultrasound has limitations in detecting congenital malformations and fetal anomalies can be missed. -Gestational hypertension (superimposed) and gestational diabetes or more common in women with class III obesity.  -Increase in nonalcoholic fatty liver disease has been reported.  Patient does not have underlying liver condition and we should, therefore, expect good maternal outcomes. -Obesity is associated with increased risk of venous thromboembolism.  If patient undergoes cesarean delivery or has prolonged hospitalization, prophylactic anticoagulation should be considered. -Fetal macrosomia is also increased with maternal obesity. -We do not recommend weight loss during pregnancy.  A weight gain of 11 to 20 pounds (5 to 9 kg) is recommended. -Obesity can pose anesthetic challenges and I recommend anesthesiology consultation before delivery. -Early epidural analgesia in labor is recommended. -Operative wound infections are more frequent.  Prenatal screening Explained that insufficient fetal DNA screening is commonly seen in obese pregnant women.  Can also be associated with increased risk of chromosomal anomalies.  Although ultrasound did not show any obvious fetal structural defects, it does not exclude chromosomal anomalies.  I explained that only amniocentesis will give a definitive result on the fetal karyotype.  I discussed the procedure and possible complication of miscarriage (1 and 500 procedures).  Silent carrier for alpha thalassemia.  I discussed the genetics and the  recommended partner screening to identify his carrier status.  If partner is a silent carrier, the fetus has got 1 in 4 chance of being a carrier (not anemia).  Patient had requested not to discuss her medical history in front of her partner. Hypertension is known to her partner. I did not discuss her recent neurological history. Later, I called her on the phone, and she mentioned that she did not want to discuss her "weight issues" in front of her partner. She reported that she did not have any neurological symptoms. I counseled her on obesity in pregnancy.  Recommendations -Follow-up with her cardiologist to address long-term cardiac monitoring for palpitations. -An appointment was made for her to return in 4 weeks for completion of fetal anatomy. -Fetal growth assessment every 4 weeks. -Weekly BPP from 32 weeks' gestation till delivery. -Delivery at 45 or 39 weeks provided antenatal testing remains reassuring. -Anesthesiologist consultation in the third trimester. -Partner screening for alpha thalassemia if he opts for it.     Thank you for consultation.  If you have any questions or concerns, please contact me the [facility].  Consultation including face-to-face (more than 50%) counseling 45 minutes.

## 2020-08-26 ENCOUNTER — Encounter: Payer: Self-pay | Admitting: Family Medicine

## 2020-08-26 ENCOUNTER — Other Ambulatory Visit: Payer: Self-pay

## 2020-08-26 ENCOUNTER — Ambulatory Visit (INDEPENDENT_AMBULATORY_CARE_PROVIDER_SITE_OTHER): Payer: Managed Care, Other (non HMO) | Admitting: Family Medicine

## 2020-08-26 VITALS — BP 151/90 | HR 81 | Wt >= 6400 oz

## 2020-08-26 DIAGNOSIS — F32A Depression, unspecified: Secondary | ICD-10-CM

## 2020-08-26 DIAGNOSIS — E538 Deficiency of other specified B group vitamins: Secondary | ICD-10-CM

## 2020-08-26 DIAGNOSIS — Z6841 Body Mass Index (BMI) 40.0 and over, adult: Secondary | ICD-10-CM

## 2020-08-26 DIAGNOSIS — I10 Essential (primary) hypertension: Secondary | ICD-10-CM

## 2020-08-26 DIAGNOSIS — B009 Herpesviral infection, unspecified: Secondary | ICD-10-CM

## 2020-08-26 DIAGNOSIS — F419 Anxiety disorder, unspecified: Secondary | ICD-10-CM

## 2020-08-26 DIAGNOSIS — O0990 Supervision of high risk pregnancy, unspecified, unspecified trimester: Secondary | ICD-10-CM

## 2020-08-26 NOTE — Progress Notes (Signed)
+   Fetal movement. Pt has been seen by cardiology. BP meds have been changed. BP elevated today. Denies HA, visual changes, RUQ pain, or floaters.

## 2020-08-26 NOTE — Patient Instructions (Signed)

## 2020-08-26 NOTE — Progress Notes (Addendum)
   Subjective:  Shelby Chandler is a 30 y.o. G1P0000 at [redacted]w[redacted]d being seen today for ongoing prenatal care.  She is currently monitored for the following issues for this high-risk pregnancy and has Essential hypertension; Iron deficiency anemia due to chronic blood loss; Class 3 severe obesity due to excess calories without serious comorbidity with body mass index (BMI) of 60.0 to 69.9 in adult [facility]); Anxiety and depression; Chronic low back pain without sciatica; History of anemia; HSV-2 infection; Vitamin B12 deficiency; Absolute anemia; and Supervision of high risk pregnancy, antepartum on their problem list.  Patient reports no complaints.  Contractions: Not present. Vag. Bleeding: None.  Movement: Present. Denies leaking of fluid.   The following portions of the patient's history were reviewed and updated as appropriate: allergies, current medications, past family history, past medical history, past social history, past surgical history and problem list. Problem list updated.  Objective:   Vitals:   08/26/20 1308  BP: (!) 151/90  Pulse: 81  Weight: (!) 431 lb (195.5 kg)    Fetal Status: Fetal Heart Rate (bpm): 155   Movement: Present     General:  Alert, oriented and cooperative. Patient is in no acute distress.  Skin: Skin is warm and dry. No rash noted.   Cardiovascular: Normal heart rate noted  Respiratory: Normal respiratory effort, no problems with respiration noted  Abdomen: Soft, gravid, appropriate for gestational age. Pain/Pressure: Absent     Pelvic: Vag. Bleeding: None     Cervical exam deferred        Extremities: Normal range of motion.  Edema: None  Mental Status: Normal mood and affect. Normal behavior. Normal judgment and thought content.   Urinalysis:      Assessment and Plan:  Pregnancy: G1P0000 at [redacted]w[redacted]d  1. Supervision of high risk pregnancy, antepartum BP elevated, see below FHR 155 by bedside US  2. Essential hypertension Reports missed last  night's dose of meds, only just took prior to coming in BP elevated but unclear if needs to increase, directed patient to continue checking and if consistently getting >140/90 to call so we can uptitrate her regimen Following w MFM  3. Vitamin B12 deficiency Normal 10/2019, consider recheck post partum  4. 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 Class 3 severe obesity due to excess calories without serious comorbidity with body mass index (BMI) of 60.0 to 69.9 in adult ([place])   6. Anxiety and depression Doing well Seeing a therapist  Preterm labor symptoms and general obstetric precautions including but not limited to vaginal bleeding, contractions, leaking of fluid and fetal movement were reviewed in detail with the patient. Please refer to After Visit Summary for other counseling recommendations.  Return in 4 weeks (on 09/23/2020) for [facility], ob visit, needs MD.   Clarnce Flock, MD

## 2020-08-31 ENCOUNTER — Encounter: Payer: Self-pay | Admitting: Neurology

## 2020-08-31 ENCOUNTER — Other Ambulatory Visit: Payer: Self-pay

## 2020-08-31 ENCOUNTER — Ambulatory Visit: Payer: Managed Care, Other (non HMO) | Admitting: Neurology

## 2020-08-31 VITALS — BP 151/99 | HR 93 | Ht 65.0 in | Wt >= 6400 oz

## 2020-08-31 DIAGNOSIS — R2990 Unspecified symptoms and signs involving the nervous system: Secondary | ICD-10-CM

## 2020-08-31 DIAGNOSIS — R202 Paresthesia of skin: Secondary | ICD-10-CM

## 2020-08-31 NOTE — Progress Notes (Signed)
[facility] [address] [place]. [place] [address] [phone]       OFFICE CONSULT NOTE  Shelby Chandler Date of Birth:  10/04/1990 Medical Record Number:  [id number]   Referring MD: Quincy Carnes, PA-C  Reason for Referral: Strokelike episode  HPI: Shelby Chandler is a 30 year old African-American lady seen today for initial office consultation visit for TIA or strokelike episode.  History is obtained from the patient and review of electronic medical records and I personally reviewed imaging films in PACS.  She has past medical history of hypertension and anemia and she is currently [redacted] weeks pregnant with her first child.  She presented on 08/07/2020 to emergency room with sudden onset of right arm heaviness and right leg tingling sensation.  There is no obvious provoking factors.  Her blood pressure was found to be mildly elevated in the ER.  She was seen by telemetry neurology consultation and initial noncontrast CT scan of the head was unremarkable except for question of bulbous prominence of the basilar tip raising concern for possible aneurysm.  Subsequently she was transferred to [facility] where she underwent an MRI scan of the brain which was normal.  Incidental partially empty sella was noted.  MR angiogram of the brain and neck were also unremarkable without significant large vessel stenosis or aneurysms.  Patient states she is done well since then she has had no recurrent episodes of numbness, tingling or other strokelike symptoms.  She is tolerating aspirin 81 mg well without bruising or bleeding.  She states her blood pressure continues to be little high but because of her obesity she does not have an appropriate sized cuff to check it.  Her blood pressure today was 150/91.  She denies history of significant migraines though occasionally she has a headache.  She states that a few months ago she is to have nasal drainage every time she bent down this  lasted for several weeks but has now stopped and not happen for a month.  She denies significant severe headaches, blurred vision, gait or balance problems.  She has no complaints today.  There is no family history of strokes or TIAs at a young age.  Patient has no prior history of DVT, pulmonary embolism or recurrent miscarriages.  She does not smoke or drink or abuse drugs  ROS:   14 system review of systems is positive for numbness, tingling, heaviness, pain all other systems negative  PMH:  Past Medical History:  Diagnosis Date   Anemia    Hypertension     Social History:  Social History   Socioeconomic History   Marital status: Single    Spouse name: Not on file   Number of children: Not on file   Years of education: Not on file   Highest education level: Not on file  Occupational History   Not on file  Tobacco Use   Smoking status: Never   Smokeless tobacco: Never  Vaping Use   Vaping Use: Never used  Substance and Sexual Activity   Alcohol use: Not Currently   Drug use: No   Sexual activity: Not Currently    Birth control/protection: None  Other Topics Concern   Not on file  Social History Narrative   Not on file   Social Determinants of Health   Financial Resource Strain: Low Risk    Difficulty of Paying Living Expenses: Not hard at all  Food Insecurity: No Food Insecurity   Worried About Running Out  of Food in the Last Year: Never true   [place] in the Last Year: Never true  Transportation Needs: No Transportation Needs   Lack of Transportation (Medical): No   Lack of Transportation (Non-Medical): No  Physical Activity: Not on file  Stress: Stress Concern Present   Feeling of Stress : To some extent  Social Connections: Unknown   Frequency of Communication with Friends and Family: More than three times a week   Frequency of Social Gatherings with Friends and Family: Not on file   Attends Religious Services: Not on Electrical engineer  or Organizations: Not on file   Attends Archivist Meetings: Not on file   Marital Status: Not on file  Intimate Partner Violence: Not on file    Medications:   Current Outpatient Medications on File Prior to Visit  Medication Sig Dispense Refill   aspirin EC 81 MG tablet Take 1 tablet (81 mg total) by mouth daily. Swallow whole. 30 tablet 6   Blood Pressure Monitoring (BLOOD PRESSURE CUFF) MISC Use as directed for checking bp daily. 1 each 0   Ferrous Sulfate 220 (44 Fe) MG/5ML SOLN Take 5 mLs by mouth every other day. 200 mL 2   NIFEdipine (PROCARDIA XL) 90 MG 24 hr tablet Take 1 tablet (90 mg total) by mouth daily. 90 tablet 3   Prenatal MV-Min-Fe Fum-FA-DHA (PRENATAL 1 PO) Take by mouth. gummies     propranolol (INDERAL) 40 MG tablet Take 1 tablet (40 mg total) by mouth 2 (two) times daily. 180 tablet 3   No current facility-administered medications on file prior to visit.    Allergies:  No Known Allergies  Physical Exam General: Morbidly obese young African-American lady seated, in no evident distress Head: head normocephalic and atraumatic.   Neck: supple with no carotid or supraclavicular bruits Cardiovascular: regular rate and rhythm, no murmurs Musculoskeletal: no deformity Skin:  no rash/petichiae Vascular:  Normal pulses all extremities  Neurologic Exam Mental Status: Awake and fully alert. Oriented to place and time. Recent and remote memory intact. Attention span, concentration and fund of knowledge appropriate. Mood and affect appropriate.  Cranial Nerves: Fundoscopic exam reveals sharp disc margins. Pupils equal, briskly reactive to light. Extraocular movements full without nystagmus. Visual fields full to confrontation. Hearing intact. Facial sensation intact. Face, tongue, palate moves normally and symmetrically.  Motor: Normal bulk and tone. Normal strength in all tested extremity muscles. Sensory.: intact to touch , pinprick , position and vibratory  sensation.  Coordination: Rapid alternating movements normal in all extremities. Finger-to-nose and heel-to-shin performed accurately bilaterally. Gait and Station: Arises from chair without difficulty. Stance is normal. Gait demonstrates normal stride length and balance .  Not able to heel, toe and tandem walk without difficulty.  Reflexes: 1+ and symmetric. Toes downgoing.   NIHSS  0 Modified Rankin  0   ASSESSMENT: 30 year old African-American lady with 20 weeks pregnancy with transient episode of right arm and leg paresthesias in the setting of mildly elevated blood pressure possibly strokelike episode versus TIA.  Vascular risk factors of obesity and hypertension only     PLAN: I had a long discussion with the patient regarding her episode of transient right body paresthesias in the setting of elevated hypertension being likely of strokelike episode and discussed results of imaging findings and answered questions.  I recommend she stay on aspirin for stroke prevention and maintain aggressive blood pressure control with goal below 140/90.  I do  not believe further stroke related work-up is indicated at the present time.  After pregnancy is over she may need to maintain an more aggressive risk factor modification to reduce chances of recurrent strokes and TIAs.  She may return for follow-up in the future as necessary.  Greater than 50% time during this 45-minute visit was spent in counseling and coordination of care and discussion about TIA and stroke risk and answering questions  Antony Contras MD Note: This document was prepared with digital dictation and possible smart phrase technology. Any transcriptional errors that result from this process are unintentional.

## 2020-08-31 NOTE — Patient Instructions (Signed)
I had a long discussion with the patient regarding her episode of transient right body paresthesias in the setting of elevated hypertension being likely of strokelike episode and discussed results of imaging findings and answered questions.  I recommend she stay on aspirin for stroke prevention and maintain aggressive blood pressure control with goal below 140/90.  I do not believe further stroke related work-up is indicated at the present time.  She may return for follow-up in the future as necessary.

## 2020-09-10 ENCOUNTER — Ambulatory Visit: Payer: Managed Care, Other (non HMO) | Admitting: Cardiology

## 2020-09-11 ENCOUNTER — Ambulatory Visit (INDEPENDENT_AMBULATORY_CARE_PROVIDER_SITE_OTHER): Payer: Managed Care, Other (non HMO) | Admitting: Cardiology

## 2020-09-11 ENCOUNTER — Encounter: Payer: Self-pay | Admitting: Cardiology

## 2020-09-11 ENCOUNTER — Other Ambulatory Visit: Payer: Self-pay

## 2020-09-11 VITALS — BP 159/76 | HR 88 | Ht 65.0 in | Wt >= 6400 oz

## 2020-09-11 DIAGNOSIS — I441 Atrioventricular block, second degree: Secondary | ICD-10-CM | POA: Diagnosis not present

## 2020-09-11 DIAGNOSIS — I471 Supraventricular tachycardia: Secondary | ICD-10-CM | POA: Diagnosis not present

## 2020-09-11 DIAGNOSIS — O162 Unspecified maternal hypertension, second trimester: Secondary | ICD-10-CM | POA: Diagnosis not present

## 2020-09-11 MED ORDER — PROPRANOLOL HCL 80 MG PO TABS: 80.0000 mg | ORAL_TABLET | Freq: Two times a day (BID) | ORAL | 3 refills | Status: DC

## 2020-09-11 NOTE — Patient Instructions (Signed)
Medication Instructions:  Your physician has recommended you make the following change in your medication:  INCREASE: Propranolol 80 mg twice daily *If you need a refill on your cardiac medications before your next appointment, please call your pharmacy*   Lab Work: None If you have labs (blood work) drawn today and your tests are completely normal, you will receive your results only by: [facility] (if you have MyChart) OR A paper copy in the mail If you have any lab test that is abnormal or we need to change your treatment, we will call you to review the results.   Testing/Procedures: None   Follow-Up: At [facility], you and your health needs are our priority.  As part of our continuing mission to provide you with exceptional heart care, we have created designated Provider Care Teams.  These Care Teams include your primary Cardiologist (physician) and Advanced Practice Providers (APPs -  Physician Assistants and Nurse Practitioners) who all work together to provide you with the care you need, when you need it.  We recommend signing up for the patient portal called "MyChart".  Sign up information is provided on this After Visit Summary.  MyChart is used to connect with patients for Virtual Visits (Telemedicine).  Patients are able to view lab/test results, encounter notes, upcoming appointments, etc.  Non-urgent messages can be sent to your provider as well.   To learn more about what you can do with MyChart, go to [email or web address].    Your next appointment:   4 week(s)  The format for your next appointment:   In Person  Provider:   Berniece Salines, DO   Other Instructions

## 2020-09-11 NOTE — Progress Notes (Signed)
Cardio-Obstetrics Clinic  Follow Up Note   Date:  09/13/2020   ID:  Shelby Chandler, DOB [birth year], MRN [id number]  PCP:  Billie Ruddy, MD   [facility] Providers Cardiologist:  Berniece Salines, DO  Electrophysiologist:  None        Referring MD: Clarisa Fling, NP   Chief Complaint: Follow-up for blood pressure   History of Present Illness:    Shelby Chandler is a 30 y.o. female [id number] who returns for follow up of hypertension.  The patient is 21 weeks 6 days pregnant is here today.  I last saw the patient on August 14, 2020 at that time she had been experiencing intermittent palpitations along with hypertension.  She was on Procardia with labetalol prior to that visit.  But before I saw the patient in July 2022 she has stopped the labetalol as she reported this was giving her significant palpitations.  At that visit, I did not restart her labetalol however advised the patient on propanolol.  She tells me that the propanolol has been helping significantly with the palpitations.  Unfortunately over the last several weeks her blood pressure has been increasing.   Prior CV Studies Reviewed: The following studies were reviewed today:   Past Medical History:  Diagnosis Date   Anemia    Hypertension     Past Surgical History:  Procedure Laterality Date   TOOTH EXTRACTION        OB History     Gravida  1   Para  0   Term  0   Preterm      AB  0   Living         SAB  0   IAB  0   Ectopic  0   Multiple  0   Live Births                  Current Medications: Current Meds  Medication Sig   aspirin EC 81 MG tablet Take 1 tablet (81 mg total) by mouth daily. Swallow whole.   Blood Pressure Monitoring (BLOOD PRESSURE CUFF) MISC Use as directed for checking bp daily. (Patient taking differently: 1 each by Other route as directed. Use as directed for checking bp daily.)   Ferrous Sulfate 220 (44 Fe) MG/5ML SOLN Take 5 mLs by mouth  every other day.   NIFEdipine (PROCARDIA XL) 90 MG 24 hr tablet Take 1 tablet (90 mg total) by mouth daily.   Prenatal MV-Min-Fe Fum-FA-DHA (PRENATAL 1 PO) Take 1 tablet by mouth daily. Gummies/Unknown strenght   propranolol (INDERAL) 80 MG tablet Take 1 tablet (80 mg total) by mouth 2 (two) times daily.   [DISCONTINUED] propranolol (INDERAL) 40 MG tablet Take 1 tablet (40 mg total) by mouth 2 (two) times daily.     Allergies:   Patient has no known allergies.   Social History   Socioeconomic History   Marital status: Single    Spouse name: Not on file   Number of children: Not on file   Years of education: Not on file   Highest education level: Not on file  Occupational History   Not on file  Tobacco Use   Smoking status: Never   Smokeless tobacco: Never  Vaping Use   Vaping Use: Never used  Substance and Sexual Activity   Alcohol use: Not Currently   Drug use: No   Sexual activity: Not Currently    Birth control/protection: None  Other Topics Concern  Not on file  Social History Narrative   Not on file   Social Determinants of Health   Financial Resource Strain: Low Risk    Difficulty of Paying Living Expenses: Not hard at all  Food Insecurity: No Food Insecurity   Worried About Charity fundraiser in the Last Year: Never true   Arboriculturist in the Last Year: Never true  Transportation Needs: No Transportation Needs   Lack of Transportation (Medical): No   Lack of Transportation (Non-Medical): No  Physical Activity: Not on file  Stress: Stress Concern Present   Feeling of Stress : To some extent  Social Connections: Unknown   Frequency of Communication with Friends and Family: More than three times a week   Frequency of Social Gatherings with Friends and Family: Not on file   Attends Religious Services: Not on Electrical engineer or Organizations: Not on file   Attends Archivist Meetings: Not on file   Marital Status: Not on file       Family History  Problem Relation Age of Onset   Alcohol abuse Mother    Arthritis Mother    Depression Mother    Drug abuse Mother    Heart attack Mother    Hypertension Mother    Hyperlipidemia Mother    Alcohol abuse Father    Diabetes Father    Drug abuse Father    Hypertension Father    Heart attack Father    Stroke Father    Hypertension Sister    Arthritis Maternal Grandmother    Arthritis Maternal Grandfather    Diabetes Paternal Grandmother       ROS:   Please see the history of present illness.     Review of Systems  Constitution: Negative for decreased appetite, fever and weight gain.  HENT: Negative for congestion, ear discharge, hoarse voice and sore throat.   Eyes: Negative for discharge, redness, vision loss in right eye and visual halos.  Cardiovascular: Palpitations is improving.  Negative for chest pain, dyspnea on exertion, leg swelling, orthopnea.  Respiratory: Negative for cough, hemoptysis, shortness of breath and snoring.   Endocrine: Negative for heat intolerance and polyphagia.  Hematologic/Lymphatic: Negative for bleeding problem. Does not bruise/bleed easily.  Skin: Negative for flushing, nail changes, rash and suspicious lesions.  Musculoskeletal: Negative for arthritis, joint pain, muscle cramps, myalgias, neck pain and stiffness.  Gastrointestinal: Negative for abdominal pain, bowel incontinence, diarrhea and excessive appetite.  Genitourinary: Negative for decreased libido, genital sores and incomplete emptying.  Neurological: Negative for brief paralysis, focal weakness, headaches and loss of balance.  Psychiatric/Behavioral: Negative for altered mental status, depression and suicidal ideas.  Allergic/Immunologic: Negative for HIV exposure and persistent infections.     Labs/EKG Reviewed:    EKG:   None today  Recent Labs: 08/07/2020: ALT 25; BUN 7; Creatinine, Ser 0.81; Hemoglobin 10.3; Platelets 466; Potassium 3.3; Sodium 135    Recent Lipid Panel Lab Results  Component Value Date/Time   CHOL 174 01/09/2017 12:01 PM   TRIG 72 01/09/2017 12:01 PM   HDL 51 01/09/2017 12:01 PM   CHOLHDL 3.4 01/09/2017 12:01 PM   LDLCALC 109 (H) 01/09/2017 12:01 PM    Physical Exam:    VS:  BP (!) 159/76 (BP Location: Right Arm, Patient Position: Sitting)   Pulse 88   Ht '5\' 5"'$  (1.651 m)   Wt (!) 442 lb 1.6 oz (200.5 kg)   LMP 04/13/2020 (Exact Date)  SpO2 100%   BMI 73.57 kg/m     Wt Readings from Last 3 Encounters:  09/11/20 (!) 442 lb 1.6 oz (200.5 kg)  08/31/20 (!) 431 lb (195.5 kg)  08/26/20 (!) 431 lb (195.5 kg)     GEN:  Well nourished, well developed in no acute distress HEENT: Normal NECK: No JVD; No carotid bruits LYMPHATICS: No lymphadenopathy CARDIAC: RRR, no murmurs, rubs, gallops RESPIRATORY:  Clear to auscultation without rales, wheezing or rhonchi  ABDOMEN: Soft, non-tender, non-distended MUSCULOSKELETAL:  No edema; No deformity  SKIN: Warm and dry NEUROLOGIC:  Alert and oriented x 3 PSYCHIATRIC:  Normal affect    Risk Assessment/Risk Calculators:     CARPREG II Risk Prediction Index Score:  1.  The patient's risk for a primary cardiac event is 5%.            ASSESSMENT & PLAN:    Hypertension in pregnancy Morbid obesity Rare paroxysmal SVT   We discussed.  She will not be able to take propanolol 3 times a day given her work schedule.  Thank Continue her Procardia 90 mg daily and increase her propanolol to 80 mg twice a day.  She is having said this is also helping with her palpitations.  I am hoping this can help with her blood pressure as well.  If this does not improve we will try to consider antihypertensive likely hydralazine.  I discussed her monitor results with her, few episodes of short runs paroxysmal SVT.  But her palpitations she notes has improved significantly.  Hoping also with the increase propranolol symptoms were completely resolved.  Patient Instructions   Medication Instructions:  Your physician has recommended you make the following change in your medication:  INCREASE: Propranolol 80 mg twice daily *If you need a refill on your cardiac medications before your next appointment, please call your pharmacy*   Lab Work: None If you have labs (blood work) drawn today and your tests are completely normal, you will receive your results only by: [place] (if you have MyChart) OR A paper copy in the mail If you have any lab test that is abnormal or we need to change your treatment, we will call you to review the results.   Testing/Procedures: None   Follow-Up: At [facility], you and your health needs are our priority.  As part of our continuing mission to provide you with exceptional heart care, we have created designated Provider Care Teams.  These Care Teams include your primary Cardiologist (physician) and Advanced Practice Providers (APPs -  Physician Assistants and Nurse Practitioners) who all work together to provide you with the care you need, when you need it.  We recommend signing up for the patient portal called "MyChart".  Sign up information is provided on this After Visit Summary.  MyChart is used to connect with patients for Virtual Visits (Telemedicine).  Patients are able to view lab/test results, encounter notes, upcoming appointments, etc.  Non-urgent messages can be sent to your provider as well.   To learn more about what you can do with MyChart, go to [email or web address].    Your next appointment:   4 week(s)  The format for your next appointment:   In Person  Provider:   Berniece Salines, DO   Other Instructions    Dispo:  No follow-ups on file.   Medication Adjustments/Labs and Tests Ordered: Current medicines are reviewed at length with the patient today.  Concerns regarding medicines are outlined above.  Tests Ordered: No  orders of the defined types were placed in this encounter.  Medication  Changes: Meds ordered this encounter  Medications   propranolol (INDERAL) 80 MG tablet    Sig: Take 1 tablet (80 mg total) by mouth 2 (two) times daily.    Dispense:  180 tablet    Refill:  3

## 2020-09-13 DIAGNOSIS — I471 Supraventricular tachycardia, unspecified: Secondary | ICD-10-CM | POA: Insufficient documentation

## 2020-09-13 DIAGNOSIS — O162 Unspecified maternal hypertension, second trimester: Secondary | ICD-10-CM | POA: Insufficient documentation

## 2020-09-22 ENCOUNTER — Other Ambulatory Visit: Payer: Self-pay

## 2020-09-22 ENCOUNTER — Encounter: Payer: Self-pay | Admitting: *Deleted

## 2020-09-22 ENCOUNTER — Ambulatory Visit: Payer: Managed Care, Other (non HMO) | Attending: Obstetrics and Gynecology

## 2020-09-22 ENCOUNTER — Ambulatory Visit: Payer: Managed Care, Other (non HMO) | Admitting: *Deleted

## 2020-09-22 ENCOUNTER — Other Ambulatory Visit: Payer: Self-pay | Admitting: *Deleted

## 2020-09-22 VITALS — BP 145/103 | HR 66

## 2020-09-22 DIAGNOSIS — O10912 Unspecified pre-existing hypertension complicating pregnancy, second trimester: Secondary | ICD-10-CM

## 2020-09-22 DIAGNOSIS — Z148 Genetic carrier of other disease: Secondary | ICD-10-CM

## 2020-09-22 DIAGNOSIS — Z362 Encounter for other antenatal screening follow-up: Secondary | ICD-10-CM

## 2020-09-22 DIAGNOSIS — O99012 Anemia complicating pregnancy, second trimester: Secondary | ICD-10-CM

## 2020-09-22 DIAGNOSIS — D649 Anemia, unspecified: Secondary | ICD-10-CM

## 2020-09-22 DIAGNOSIS — O322XX0 Maternal care for transverse and oblique lie, not applicable or unspecified: Secondary | ICD-10-CM

## 2020-09-22 DIAGNOSIS — Z6841 Body Mass Index (BMI) 40.0 and over, adult: Secondary | ICD-10-CM

## 2020-09-22 DIAGNOSIS — O99212 Obesity complicating pregnancy, second trimester: Secondary | ICD-10-CM | POA: Diagnosis not present

## 2020-09-22 DIAGNOSIS — O10012 Pre-existing essential hypertension complicating pregnancy, second trimester: Secondary | ICD-10-CM | POA: Diagnosis not present

## 2020-09-22 DIAGNOSIS — Z3A23 23 weeks gestation of pregnancy: Secondary | ICD-10-CM

## 2020-09-22 NOTE — Progress Notes (Signed)
Dr. Gertie Exon aware of elevated BP's.

## 2020-09-23 ENCOUNTER — Ambulatory Visit (INDEPENDENT_AMBULATORY_CARE_PROVIDER_SITE_OTHER): Payer: Managed Care, Other (non HMO) | Admitting: Obstetrics and Gynecology

## 2020-09-23 VITALS — BP 149/90 | HR 77 | Wt >= 6400 oz

## 2020-09-23 DIAGNOSIS — M545 Low back pain, unspecified: Secondary | ICD-10-CM

## 2020-09-23 DIAGNOSIS — D500 Iron deficiency anemia secondary to blood loss (chronic): Secondary | ICD-10-CM

## 2020-09-23 DIAGNOSIS — B009 Herpesviral infection, unspecified: Secondary | ICD-10-CM

## 2020-09-23 DIAGNOSIS — G8929 Other chronic pain: Secondary | ICD-10-CM

## 2020-09-23 DIAGNOSIS — Z6841 Body Mass Index (BMI) 40.0 and over, adult: Secondary | ICD-10-CM

## 2020-09-23 DIAGNOSIS — O0990 Supervision of high risk pregnancy, unspecified, unspecified trimester: Secondary | ICD-10-CM

## 2020-09-23 DIAGNOSIS — O10012 Pre-existing essential hypertension complicating pregnancy, second trimester: Secondary | ICD-10-CM

## 2020-09-23 DIAGNOSIS — Z3A23 23 weeks gestation of pregnancy: Secondary | ICD-10-CM | POA: Insufficient documentation

## 2020-09-23 DIAGNOSIS — E66813 Obesity, class 3: Secondary | ICD-10-CM

## 2020-09-23 MED ORDER — CYCLOBENZAPRINE HCL 10 MG PO TABS: 10.0000 mg | ORAL_TABLET | Freq: Three times a day (TID) | ORAL | 2 refills | Status: DC | PRN

## 2020-09-23 MED ORDER — NIFEDIPINE ER OSMOTIC RELEASE 90 MG PO TB24: 90.0000 mg | ORAL_TABLET | Freq: Every day | ORAL | 3 refills | Status: DC

## 2020-09-23 NOTE — Progress Notes (Signed)
   PRENATAL VISIT NOTE  Subjective:  Shelby Chandler is a 30 y.o. G1P0000 at 37w2dbeing seen today for ongoing prenatal care.  She is currently monitored for the following issues for this high-risk pregnancy and has Essential hypertension; Iron deficiency anemia due to chronic blood loss; Class 3 severe obesity due to excess calories without serious comorbidity with body mass index (BMI) of 60.0 to 69.9 in adult ([facility]; Anxiety and depression; Chronic low back pain without sciatica; History of anemia; HSV-2 infection; Vitamin B12 deficiency; Absolute anemia; Supervision of high risk pregnancy, antepartum; Hypertension during pregnancy in second trimester; Morbid obesity (HCC); PSVT (paroxysmal supraventricular tachycardia) ([place]; [redacted] weeks gestation of pregnancy; and BMI 70 and over, adult ([facility] on their problem list.  Patient doing well with no acute concerns today. She reports backache.  Contractions: Not present. Vag. Bleeding: None.  Movement: Present. Denies leaking of fluid.   The following portions of the patient's history were reviewed and updated as appropriate: allergies, current medications, past family history, past medical history, past social history, past surgical history and problem list. Problem list updated.  Objective:   Vitals:   09/23/20 1611  BP: (!) 149/90  Pulse: 77  Weight: (!) 438 lb (198.7 kg)    Fetal Status: Fetal Heart Rate (bpm): 150   Movement: Present     General:  Alert, oriented and cooperative. Patient is in no acute distress.  Skin: Skin is warm and dry. No rash noted.   Cardiovascular: Normal heart rate noted  Respiratory: Normal respiratory effort, no problems with respiration noted  Abdomen: Soft, gravid, appropriate for gestational age.  Pain/Pressure: Present     Pelvic: Cervical exam deferred        Extremities: Normal range of motion.  Edema: None  Mental Status:  Normal mood and affect. Normal behavior. Normal judgment and thought  content.   Assessment and Plan:  Pregnancy: G1P0000 at 221w2d1. [redacted] weeks gestation of pregnancy   2. Pre-existing essential hypertension during pregnancy in second trimester Pt was unaware she should still be taking the procardia with her propranolol.  Reviewed chart with pt including cardiology and Dr. ToHonor Junesote.  Pt needs to be taking both medications.  3. Iron deficiency anemia due to chronic blood loss Will monitor h/h  4. Class 3 severe obesity due to excess calories without serious comorbidity with body mass index (BMI) of 60.0 to 69.9 in adult ([place]  5. 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 Supervision of high risk pregnancy, antepartum Continue routine care, BP check at visit in 2 weeks Trial of flexeril for chronic back pain, pt may need PT at some point  7. Morbid obesity ([facility]  8. BMI 70 and over, adult ([facility]  Preterm labor symptoms and general obstetric precautions including but not limited to vaginal bleeding, contractions, leaking of fluid and fetal movement were reviewed in detail with the patient.  Please refer to After Visit Summary for other counseling recommendations.   Return in about 2 weeks (around 10/07/2020) [facility].   LaLynnda ShieldsMD Faculty Attending Center for [facility]

## 2020-09-23 NOTE — Progress Notes (Signed)
+   Fetal movement. BP is elevated. Pt denies any HA, visual changes, floaters, or RUQ pain. Pt c/o increased back pain especially with sleeping.

## 2020-10-07 ENCOUNTER — Other Ambulatory Visit: Payer: Self-pay

## 2020-10-07 ENCOUNTER — Encounter: Payer: Managed Care, Other (non HMO) | Admitting: Obstetrics and Gynecology

## 2020-10-07 ENCOUNTER — Ambulatory Visit (INDEPENDENT_AMBULATORY_CARE_PROVIDER_SITE_OTHER): Payer: Managed Care, Other (non HMO) | Admitting: Obstetrics and Gynecology

## 2020-10-07 ENCOUNTER — Other Ambulatory Visit: Payer: Managed Care, Other (non HMO)

## 2020-10-07 VITALS — BP 131/82 | HR 96 | Wt >= 6400 oz

## 2020-10-07 DIAGNOSIS — O0990 Supervision of high risk pregnancy, unspecified, unspecified trimester: Secondary | ICD-10-CM

## 2020-10-07 DIAGNOSIS — Z6841 Body Mass Index (BMI) 40.0 and over, adult: Secondary | ICD-10-CM

## 2020-10-07 DIAGNOSIS — O10012 Pre-existing essential hypertension complicating pregnancy, second trimester: Secondary | ICD-10-CM

## 2020-10-07 DIAGNOSIS — Z3A25 25 weeks gestation of pregnancy: Secondary | ICD-10-CM | POA: Insufficient documentation

## 2020-10-07 DIAGNOSIS — B009 Herpesviral infection, unspecified: Secondary | ICD-10-CM

## 2020-10-07 NOTE — Progress Notes (Signed)
   PRENATAL VISIT NOTE  Subjective:  Shelby Chandler is a 30 y.o. G1P0000 at 13w2dbeing seen today for ongoing prenatal care.  She is currently monitored for the following issues for this high-risk pregnancy and has Essential hypertension; Iron deficiency anemia due to chronic blood loss; Class 3 severe obesity due to excess calories without serious comorbidity with body mass index (BMI) of 60.0 to 69.9 in adult ([facility]; Anxiety and depression; Chronic low back pain without sciatica; History of anemia; HSV-2 infection; Vitamin B12 deficiency; Absolute anemia; Supervision of high risk pregnancy, antepartum; Hypertension during pregnancy in second trimester; Morbid obesity (HCC); PSVT (paroxysmal supraventricular tachycardia) ([place]; [redacted] weeks gestation of pregnancy; BMI 70 and over, adult ([place]; and [redacted] weeks gestation of pregnancy on their problem list.  Patient doing well with no acute concerns today. She reports no complaints.  Contractions: Not present. Vag. Bleeding: None.  Movement: Present. Denies leaking of fluid.   The following portions of the patient's history were reviewed and updated as appropriate: allergies, current medications, past family history, past medical history, past social history, past surgical history and problem list. Problem list updated.  Objective:   Vitals:   10/07/20 1616 10/07/20 1617  BP: (!) 151/97 131/82  Pulse: 88 96  Weight: (!) 437 lb 4.8 oz (198.4 kg)     Fetal Status: Fetal Heart Rate (bpm): 142   Movement: Present     General:  Alert, oriented and cooperative. Patient is in no acute distress.  Skin: Skin is warm and dry. No rash noted.   Cardiovascular: Normal heart rate noted  Respiratory: Normal respiratory effort, no problems with respiration noted  Abdomen: Soft, gravid, appropriate for gestational age.  Pain/Pressure: Absent     Pelvic: Cervical exam deferred        Extremities: Normal range of motion.  Edema: None  Mental Status:  Normal  mood and affect. Normal behavior. Normal judgment and thought content.   Assessment and Plan:  Pregnancy: G1P0000 at 23w2d1. [redacted] weeks gestation of pregnancy   2. Pre-existing essential hypertension during pregnancy in second trimester Pt notes compliance with propranolol and nifedipine with somewhat improved values  3. BMI 70 and over, adult ([facility]  4. Supervision of high risk pregnancy, antepartum Continue routine care, 2 hour GTT next visit  5. HSV-2 infection Prophylaxis at 36 weeks  Preterm labor symptoms and general obstetric precautions including but not limited to vaginal bleeding, contractions, leaking of fluid and fetal movement were reviewed in detail with the patient.  Please refer to After Visit Summary for other counseling recommendations.   Return in about 2 weeks (around 10/21/2020) [facility], 3rd trim labs.   LaLynnda ShieldsMD Faculty Attending Center for [facility]

## 2020-10-07 NOTE — Progress Notes (Signed)
Patient presents for [facility]. Patient has no concerns today.

## 2020-10-13 ENCOUNTER — Ambulatory Visit: Payer: Managed Care, Other (non HMO) | Admitting: Cardiology

## 2020-10-21 ENCOUNTER — Ambulatory Visit (HOSPITAL_BASED_OUTPATIENT_CLINIC_OR_DEPARTMENT_OTHER): Payer: Managed Care, Other (non HMO) | Admitting: Obstetrics

## 2020-10-21 ENCOUNTER — Other Ambulatory Visit: Payer: Self-pay

## 2020-10-21 ENCOUNTER — Ambulatory Visit: Payer: Managed Care, Other (non HMO) | Admitting: *Deleted

## 2020-10-21 ENCOUNTER — Other Ambulatory Visit: Payer: Self-pay | Admitting: Maternal & Fetal Medicine

## 2020-10-21 ENCOUNTER — Ambulatory Visit: Payer: Managed Care, Other (non HMO) | Attending: Maternal & Fetal Medicine

## 2020-10-21 ENCOUNTER — Encounter: Payer: Self-pay | Admitting: *Deleted

## 2020-10-21 VITALS — BP 138/96 | HR 74

## 2020-10-21 DIAGNOSIS — O99212 Obesity complicating pregnancy, second trimester: Secondary | ICD-10-CM | POA: Diagnosis not present

## 2020-10-21 DIAGNOSIS — O365920 Maternal care for other known or suspected poor fetal growth, second trimester, not applicable or unspecified: Secondary | ICD-10-CM | POA: Diagnosis not present

## 2020-10-21 DIAGNOSIS — O99012 Anemia complicating pregnancy, second trimester: Secondary | ICD-10-CM | POA: Diagnosis not present

## 2020-10-21 DIAGNOSIS — O10012 Pre-existing essential hypertension complicating pregnancy, second trimester: Secondary | ICD-10-CM

## 2020-10-21 DIAGNOSIS — O10912 Unspecified pre-existing hypertension complicating pregnancy, second trimester: Secondary | ICD-10-CM

## 2020-10-21 DIAGNOSIS — Z6841 Body Mass Index (BMI) 40.0 and over, adult: Secondary | ICD-10-CM

## 2020-10-21 DIAGNOSIS — D649 Anemia, unspecified: Secondary | ICD-10-CM

## 2020-10-21 DIAGNOSIS — Z3A27 27 weeks gestation of pregnancy: Secondary | ICD-10-CM | POA: Insufficient documentation

## 2020-10-21 DIAGNOSIS — Z148 Genetic carrier of other disease: Secondary | ICD-10-CM | POA: Insufficient documentation

## 2020-10-21 DIAGNOSIS — E669 Obesity, unspecified: Secondary | ICD-10-CM

## 2020-10-21 NOTE — Progress Notes (Signed)
MFM Note  Shelby Chandler was seen for an ultrasound exam due to chronic hypertension treated with propranolol and nifedipine.  She reports that her blood pressures are under better control now that she is taking the two antihypertensive medications.  Her pregnancy has also been complicated by extreme maternal obesity with a BMI of 74.  On today's exam, the EFW measures at the second percentile for her gestational age indicating fetal growth restriction.  Low normal amniotic fluid is noted.  A greater than 2 cm pocket of amniotic fluid was present.  She denies any leakage of fluid.  Intermittent absent end-diastolic flow was noted on the umbilical artery Doppler studies.  There were no signs of reversed end-diastolic flow.  The views of the fetal anatomy were limited today due to the low normal amniotic fluid level and extreme maternal body habitus.    The implications and management of fetal growth restriction was discussed with the patient.  She was advised regarding the increased risk of an adverse pregnancy outcome such as stillbirth associated with IUGR, especially if the umbilical artery Doppler studies are abnormal.    She was advised to drink plenty of fluids and to increase periods of rest when she can.  She was also advised to continue to monitor for fetal movements.  Fetal kick count instructions were reviewed.  Due to fetal growth restriction, we will continue to follow her with weekly umbilical artery Doppler studies and fetal testing.  She understands that the goal for her pregnancy is to continue close fetal surveillance so that she can reach a more optimal gestational age for delivery.  She understands that an earlier delivery (32-34 weeks) may be recommended depending on the fetal status and her umbilical artery Doppler studies.  Another umbilical artery Doppler study and NST was scheduled in 1 week.  At the end of the consultation, the patient stated that all of her questions have  been answered.   A total of 25 minutes was spent counseling and coordinating the care for this patient.  Greater than 50% of the time was spent in direct face-to-face contact.    Recommendations:  Umbilical artery Doppler study and NST next week

## 2020-10-23 ENCOUNTER — Other Ambulatory Visit: Payer: Managed Care, Other (non HMO)

## 2020-10-23 ENCOUNTER — Ambulatory Visit (INDEPENDENT_AMBULATORY_CARE_PROVIDER_SITE_OTHER): Payer: Managed Care, Other (non HMO) | Admitting: Obstetrics and Gynecology

## 2020-10-23 ENCOUNTER — Other Ambulatory Visit: Payer: Self-pay

## 2020-10-23 VITALS — BP 154/89 | HR 82 | Wt >= 6400 oz

## 2020-10-23 DIAGNOSIS — O0990 Supervision of high risk pregnancy, unspecified, unspecified trimester: Secondary | ICD-10-CM

## 2020-10-23 DIAGNOSIS — O360920 Maternal care for other rhesus isoimmunization, second trimester, not applicable or unspecified: Secondary | ICD-10-CM

## 2020-10-23 DIAGNOSIS — B009 Herpesviral infection, unspecified: Secondary | ICD-10-CM

## 2020-10-23 DIAGNOSIS — Z6841 Body Mass Index (BMI) 40.0 and over, adult: Secondary | ICD-10-CM

## 2020-10-23 DIAGNOSIS — O10012 Pre-existing essential hypertension complicating pregnancy, second trimester: Secondary | ICD-10-CM

## 2020-10-23 DIAGNOSIS — O365990 Maternal care for other known or suspected poor fetal growth, unspecified trimester, not applicable or unspecified: Secondary | ICD-10-CM | POA: Insufficient documentation

## 2020-10-23 DIAGNOSIS — Z3A27 27 weeks gestation of pregnancy: Secondary | ICD-10-CM | POA: Diagnosis not present

## 2020-10-23 DIAGNOSIS — O98312 Other infections with a predominantly sexual mode of transmission complicating pregnancy, second trimester: Secondary | ICD-10-CM

## 2020-10-23 MED ORDER — RHO D IMMUNE GLOBULIN 1500 UNIT/2ML IJ SOSY
300.0000 ug | PREFILLED_SYRINGE | Freq: Once | INTRAMUSCULAR | Status: AC
  Administered 2020-10-23: 300 ug via INTRAMUSCULAR

## 2020-10-23 NOTE — Progress Notes (Signed)
PRENATAL VISIT NOTE  Subjective:  Shelby Chandler is a 30 y.o. G1P0000 at 1w4dbeing seen today for ongoing prenatal care.  She is currently monitored for the following issues for this high-risk pregnancy and has Essential hypertension; Iron deficiency anemia due to chronic blood loss; Class 3 severe obesity due to excess calories without serious comorbidity with body mass index (BMI) of 60.0 to 69.9 in adult ([facility]; Anxiety and depression; Chronic low back pain without sciatica; History of anemia; HSV-2 infection; Vitamin B12 deficiency; Absolute anemia; Supervision of high risk pregnancy, antepartum; Hypertension during pregnancy in second trimester; Morbid obesity (HCC); PSVT (paroxysmal supraventricular tachycardia) ([place]; [redacted] weeks gestation of pregnancy; BMI 70 and over, adult ([place]; [redacted] weeks gestation of pregnancy; [redacted] weeks gestation of pregnancy; and Pregnancy affected by fetal growth restriction on their problem list.  Patient doing well with no acute concerns today. She reports no complaints.  Contractions: Not present. Vag. Bleeding: None.  Movement: Present. Denies leaking of fluid. BP is elevated, but pt did not take her antihypertensive this morning due to her 2 hour GTT, she denies HA or visual changes  The following portions of the patient's history were reviewed and updated as appropriate: allergies, current medications, past family history, past medical history, past social history, past surgical history and problem list. Problem list updated.  Objective:   Vitals:   10/23/20 0856  BP: (!) 154/89  Pulse: 82  Weight: (!) 434 lb (196.9 kg)    Fetal Status: Fetal Heart Rate (bpm): 140   Movement: Present     General:  Alert, oriented and cooperative. Patient is in no acute distress.  Skin: Skin is warm and dry. No rash noted.   Cardiovascular: Normal heart rate noted  Respiratory: Normal respiratory effort, no problems with respiration noted  Abdomen: Soft, gravid,  appropriate for gestational age.  Pain/Pressure: Absent     Pelvic: Cervical exam deferred        Extremities: Normal range of motion.  Edema: Trace  Mental Status:  Normal mood and affect. Normal behavior. Normal judgment and thought content.   Assessment and Plan:  Pregnancy: G1P0000 at 272w4d1. Supervision of high risk pregnancy, antepartum Continue routine care - RPR - Glucose Tolerance, 2 Hours w/1 Hour - HIV antibody (with reflex) - CBC - rho (d) immune globulin (RHIG/RHOPHYLAC) injection 300 mcg  2. [redacted] weeks gestation of pregnancy   3. BMI 70 and over, adult ([place] is stable  4. HSV-2 infection Prophylaxis at 36 weeks  5. Pregnancy affected by fetal growth restriction Reviewed last u/s where EFW noted to be 2.4 %, IAEDF noted as well.  AFI was low normal.  MFM recommended BPP/dopplers in 1 week.  She has already been informed due to these findings she may need earlier delivery and is at increased risk for stillbirth.  Reemphasized fetal kick counts and the need for taking her blood pressure medication.  6. Pre-existing essential hypertension during pregnancy in second trimester Usually well controlled, pt did not take medication today due to testing, pt should have ob/cardiology visit in the next few weeks  Preterm labor symptoms and general obstetric precautions including but not limited to vaginal bleeding, contractions, leaking of fluid and fetal movement were reviewed in detail with the patient.  Please refer to After Visit Summary for other counseling recommendations.   Return in about 2 weeks (around 11/06/2020) [facility]   LaLynnda ShieldsMD Faculty Attending Center for [facility]

## 2020-10-23 NOTE — Progress Notes (Signed)
ROB/GTT/RHO.  Declined TDAP and FLU Vaccines. Rhogam given in RD, tolerated well. She has concerns about her [place] results and wants to discuss.  Administrations This Visit     rho (d) immune globulin (RHIG/RHOPHYLAC) injection 300 mcg     Admin Date 10/23/2020 Action Given Dose 300 mcg Route Intramuscular Administered By Tamela Oddi, RMA

## 2020-10-24 LAB — CBC
Hematocrit: 34.1 % (ref 34.0–46.6)
Hemoglobin: 10.9 g/dL — ABNORMAL LOW (ref 11.1–15.9)
MCH: 23.3 pg — ABNORMAL LOW (ref 26.6–33.0)
MCHC: 32 g/dL (ref 31.5–35.7)
MCV: 73 fL — ABNORMAL LOW (ref 79–97)
Platelets: 461 10*3/uL — ABNORMAL HIGH (ref 150–450)
RBC: 4.67 x10E6/uL (ref 3.77–5.28)
RDW: 15.6 % — ABNORMAL HIGH (ref 11.7–15.4)
WBC: 7.6 10*3/uL (ref 3.4–10.8)

## 2020-10-24 LAB — RPR: RPR Ser Ql: NONREACTIVE

## 2020-10-24 LAB — GLUCOSE TOLERANCE, 2 HOURS W/ 1HR
Glucose, 1 hour: 89 mg/dL (ref 65–179)
Glucose, 2 hour: 90 mg/dL (ref 65–152)
Glucose, Fasting: 82 mg/dL (ref 65–91)

## 2020-10-24 LAB — HIV ANTIBODY (ROUTINE TESTING W REFLEX): HIV Screen 4th Generation wRfx: NONREACTIVE

## 2020-10-26 ENCOUNTER — Other Ambulatory Visit: Payer: Self-pay

## 2020-10-26 ENCOUNTER — Encounter (HOSPITAL_COMMUNITY): Payer: Self-pay | Admitting: Obstetrics and Gynecology

## 2020-10-26 ENCOUNTER — Inpatient Hospital Stay (HOSPITAL_COMMUNITY): Payer: Managed Care, Other (non HMO)

## 2020-10-26 ENCOUNTER — Inpatient Hospital Stay (HOSPITAL_COMMUNITY)
Admission: AD | Admit: 2020-10-26 | Discharge: 2020-10-26 | Disposition: A | Discharge status: Home / Self Care | Payer: Managed Care, Other (non HMO) | Attending: Family Medicine | Admitting: Family Medicine

## 2020-10-26 ENCOUNTER — Other Ambulatory Visit: Payer: Self-pay | Admitting: *Deleted

## 2020-10-26 DIAGNOSIS — Z8249 Family history of ischemic heart disease and other diseases of the circulatory system: Secondary | ICD-10-CM | POA: Diagnosis not present

## 2020-10-26 DIAGNOSIS — O26893 Other specified pregnancy related conditions, third trimester: Secondary | ICD-10-CM | POA: Diagnosis present

## 2020-10-26 DIAGNOSIS — Z3A28 28 weeks gestation of pregnancy: Secondary | ICD-10-CM

## 2020-10-26 DIAGNOSIS — O10913 Unspecified pre-existing hypertension complicating pregnancy, third trimester: Secondary | ICD-10-CM | POA: Insufficient documentation

## 2020-10-26 DIAGNOSIS — Z7982 Long term (current) use of aspirin: Secondary | ICD-10-CM | POA: Insufficient documentation

## 2020-10-26 DIAGNOSIS — O26899 Other specified pregnancy related conditions, unspecified trimester: Secondary | ICD-10-CM

## 2020-10-26 DIAGNOSIS — R1011 Right upper quadrant pain: Secondary | ICD-10-CM | POA: Diagnosis not present

## 2020-10-26 DIAGNOSIS — O365930 Maternal care for other known or suspected poor fetal growth, third trimester, not applicable or unspecified: Secondary | ICD-10-CM

## 2020-10-26 DIAGNOSIS — Z833 Family history of diabetes mellitus: Secondary | ICD-10-CM | POA: Diagnosis not present

## 2020-10-26 DIAGNOSIS — R109 Unspecified abdominal pain: Secondary | ICD-10-CM

## 2020-10-26 LAB — URINALYSIS, ROUTINE W REFLEX MICROSCOPIC
Glucose, UA: NEGATIVE mg/dL
Hgb urine dipstick: NEGATIVE
Ketones, ur: 15 mg/dL — AB
Nitrite: NEGATIVE
Protein, ur: NEGATIVE mg/dL
Specific Gravity, Urine: >1.03 — ABNORMAL HIGH (ref 1.005–1.030)
pH: 6 (ref 5.0–8.0)

## 2020-10-26 LAB — COMPREHENSIVE METABOLIC PANEL
ALT: 17 U/L (ref 0–44)
AST: 19 U/L (ref 15–41)
Albumin: 2.9 g/dL — ABNORMAL LOW (ref 3.5–5.0)
Alkaline Phosphatase: 101 U/L (ref 38–126)
Anion gap: 10 (ref 5–15)
BUN: 5 mg/dL — ABNORMAL LOW (ref 6–20)
CO2: 20 mmol/L — ABNORMAL LOW (ref 22–32)
Calcium: 9 mg/dL (ref 8.9–10.3)
Chloride: 104 mmol/L (ref 98–111)
Creatinine, Ser: 0.74 mg/dL (ref 0.44–1.00)
GFR, Estimated: >60 mL/min (ref >60)
Glucose, Bld: 79 mg/dL (ref 70–99)
Potassium: 3.5 mmol/L (ref 3.5–5.1)
Sodium: 134 mmol/L — ABNORMAL LOW (ref 135–145)
Total Bilirubin: 0.2 mg/dL — ABNORMAL LOW (ref 0.3–1.2)
Total Protein: 7 g/dL (ref 6.5–8.1)

## 2020-10-26 LAB — CBC
HCT: 34.8 % — ABNORMAL LOW (ref 36.0–46.0)
Hemoglobin: 10.8 g/dL — ABNORMAL LOW (ref 12.0–15.0)
MCH: 23.7 pg — ABNORMAL LOW (ref 26.0–34.0)
MCHC: 31 g/dL (ref 30.0–36.0)
MCV: 76.5 fL — ABNORMAL LOW (ref 80.0–100.0)
Platelets: 458 10*3/uL — ABNORMAL HIGH (ref 150–400)
RBC: 4.55 MIL/uL (ref 3.87–5.11)
RDW: 15.9 % — ABNORMAL HIGH (ref 11.5–15.5)
WBC: 6.7 10*3/uL (ref 4.0–10.5)
nRBC: 0 % (ref 0.0–0.2)

## 2020-10-26 LAB — PROTEIN / CREATININE RATIO, URINE
Creatinine, Urine: 394.98 mg/dL
Protein Creatinine Ratio: 0.09 mg/mg{Cre} (ref 0.00–0.15)
Total Protein, Urine: 37 mg/dL

## 2020-10-26 LAB — URINALYSIS, MICROSCOPIC (REFLEX): Squamous Epithelial / HPF: >50 (ref 0–5)

## 2020-10-26 MED ORDER — LABETALOL HCL 5 MG/ML IV SOLN: 40.0000 mg | INTRAVENOUS | Status: DC | PRN

## 2020-10-26 MED ORDER — LABETALOL HCL 5 MG/ML IV SOLN: 20.0000 mg | INTRAVENOUS | Status: DC | PRN

## 2020-10-26 MED ORDER — LACTATED RINGERS IV SOLN: INTRAVENOUS | Status: DC

## 2020-10-26 MED ORDER — HYDRALAZINE HCL 20 MG/ML IJ SOLN: 10.0000 mg | INTRAMUSCULAR | Status: DC | PRN

## 2020-10-26 MED ORDER — LABETALOL HCL 5 MG/ML IV SOLN
INTRAVENOUS | Status: AC
  Administered 2020-10-26: 20 mg via INTRAVENOUS
  Filled 2020-10-26: qty 4

## 2020-10-26 MED ORDER — LABETALOL HCL 5 MG/ML IV SOLN: 80.0000 mg | INTRAVENOUS | Status: DC | PRN

## 2020-10-26 NOTE — MAU Note (Signed)
Sharp pain from rt hip around to front, thought it was round ligament pain, but hasn't gone away. Started when she first got up at 0640. No bleeding or leaking. Reports +FM.  Denies GI  or GU problems.

## 2020-10-26 NOTE — Progress Notes (Signed)
Patient stated that she did not take her blood pressure medication this morning

## 2020-10-26 NOTE — MAU Provider Note (Signed)
History     CSN: [id number]  Arrival date and time: 10/26/20 0731    Chief Complaint  Patient presents with   Abdominal Pain   HPI This is a 30 year old G1, P0 at 28 weeks today with a pregnancy complicated by hypertension.  She presents with right flank pain that started around 6:30 in the morning.  Pain is sharp and radiates into her right upper quadrant.  Denies nausea, vomiting, edema, headache, vision changes, fevers, chills.  Her initial blood pressure was elevated.  She has not taken her blood pressure medication this morning.  OB History     Gravida  1   Para  0   Term  0   Preterm      AB  0   Living         SAB  0   IAB  0   Ectopic  0   Multiple  0   Live Births              Past Medical History:  Diagnosis Date   Anemia    Hypertension     Past Surgical History:  Procedure Laterality Date   TOOTH EXTRACTION      Family History  Problem Relation Age of Onset   Alcohol abuse Mother    Arthritis Mother    Depression Mother    Drug abuse Mother    Heart attack Mother    Hypertension Mother    Hyperlipidemia Mother    Alcohol abuse Father    Diabetes Father    Drug abuse Father    Hypertension Father    Heart attack Father    Stroke Father    Hypertension Sister    Arthritis Maternal Grandmother    Arthritis Maternal Grandfather    Diabetes Paternal Grandmother     Social History   Tobacco Use   Smoking status: Never   Smokeless tobacco: Never  Vaping Use   Vaping Use: Never used  Substance Use Topics   Alcohol use: Not Currently   Drug use: No    Allergies: No Known Allergies  Medications Prior to Admission  Medication Sig Dispense Refill Last Dose   aspirin EC 81 MG tablet Take 1 tablet (81 mg total) by mouth daily. Swallow whole. 30 tablet 6 10/25/2020   cyclobenzaprine (FLEXERIL) 10 MG tablet Take 1 tablet (10 mg total) by mouth 3 (three) times daily as needed for muscle spasms. 30 tablet 2 10/25/2020    Ferrous Sulfate 220 (44 Fe) MG/5ML SOLN Take 5 mLs by mouth every other day. 200 mL 2 10/25/2020   NIFEdipine (PROCARDIA XL) 90 MG 24 hr tablet Take 1 tablet (90 mg total) by mouth daily. 90 tablet 3 10/25/2020   Prenatal MV-Min-Fe Fum-FA-DHA (PRENATAL 1 PO) Take 1 tablet by mouth daily. Gummies/Unknown strenght   10/25/2020   propranolol (INDERAL) 80 MG tablet Take 1 tablet (80 mg total) by mouth 2 (two) times daily. 180 tablet 3 10/25/2020   Blood Pressure Monitoring (BLOOD PRESSURE CUFF) MISC Use as directed for checking bp daily. (Patient taking differently: 1 each by Other route as directed. Use as directed for checking bp daily.) 1 each 0     Review of Systems Physical Exam   Blood pressure 136/80, pulse 72, temperature 98 F (36.7 C), temperature source Oral, resp. rate (!) 22, height 5\' 5"  (1.651 m), weight (!) 198.6 kg, last menstrual period 04/13/2020, SpO2 100 %.  Physical Exam Vitals and nursing note reviewed.  Constitutional:  Appearance: She is well-developed.  Cardiovascular:     Rate and Rhythm: Normal rate and regular rhythm.  Abdominal:     Tenderness: There is abdominal tenderness. There is no guarding. Positive signs include Murphy's sign.  Neurological:     Mental Status: She is alert.   Results for orders placed or performed during the hospital encounter of 10/26/20 (from the past 24 hour(s))  Comprehensive metabolic panel     Status: Abnormal   Collection Time: 10/26/20  8:24 AM  Result Value Ref Range   Sodium 134 (L) 135 - 145 mmol/L   Potassium 3.5 3.5 - 5.1 mmol/L   Chloride 104 98 - 111 mmol/L   CO2 20 (L) 22 - 32 mmol/L   Glucose, Bld 79 70 - 99 mg/dL   BUN 5 (L) 6 - 20 mg/dL   Creatinine, Ser 0.74 0.44 - 1.00 mg/dL   Calcium 9.0 8.9 - 10.3 mg/dL   Total Protein 7.0 6.5 - 8.1 g/dL   Albumin 2.9 (L) 3.5 - 5.0 g/dL   AST 19 15 - 41 U/L   ALT 17 0 - 44 U/L   Alkaline Phosphatase 101 38 - 126 U/L   Total Bilirubin 0.2 (L) 0.3 - 1.2 mg/dL   GFR,  Estimated >60 >60 mL/min   Anion gap 10 5 - 15  Protein / creatinine ratio, urine     Status: None   Collection Time: 10/26/20  8:24 AM  Result Value Ref Range   Creatinine, Urine 394.98 mg/dL   Total Protein, Urine 37 mg/dL   Protein Creatinine Ratio 0.09 0.00 - 0.15 mg/mg[Cre]  CBC     Status: Abnormal   Collection Time: 10/26/20  8:24 AM  Result Value Ref Range   WBC 6.7 4.0 - 10.5 K/uL   RBC 4.55 3.87 - 5.11 MIL/uL   Hemoglobin 10.8 (L) 12.0 - 15.0 g/dL   HCT 34.8 (L) 36.0 - 46.0 %   MCV 76.5 (L) 80.0 - 100.0 fL   MCH 23.7 (L) 26.0 - 34.0 pg   MCHC 31.0 30.0 - 36.0 g/dL   RDW 15.9 (H) 11.5 - 15.5 %   Platelets 458 (H) 150 - 400 K/uL   nRBC 0.0 0.0 - 0.2 %  Urinalysis, Routine w reflex microscopic     Status: Abnormal   Collection Time: 10/26/20  8:24 AM  Result Value Ref Range   Color, Urine YELLOW YELLOW   APPearance CLOUDY (A) CLEAR   Specific Gravity, Urine >1.030 (H) 1.005 - 1.030   pH 6.0 5.0 - 8.0   Glucose, UA NEGATIVE NEGATIVE mg/dL   Hgb urine dipstick NEGATIVE NEGATIVE   Bilirubin Urine SMALL (A) NEGATIVE   Ketones, ur 15 (A) NEGATIVE mg/dL   Protein, ur NEGATIVE NEGATIVE mg/dL   Nitrite NEGATIVE NEGATIVE   Leukocytes,Ua MODERATE (A) NEGATIVE  Urinalysis, Microscopic (reflex)     Status: Abnormal   Collection Time: 10/26/20  8:24 AM  Result Value Ref Range   RBC / HPF 11-20 0 - 5 RBC/hpf   WBC, UA 21-50 0 - 5 WBC/hpf   Bacteria, UA MANY (A) NONE SEEN   Squamous Epithelial / LPF >50 0 - 5   Mucus PRESENT    Ca Oxalate Crys, UA PRESENT    US Abdomen Limited RUQ (LIVER/GB)  Result Date: 10/26/2020 CLINICAL DATA:  Right upper quadrant pain EXAM: ULTRASOUND ABDOMEN LIMITED RIGHT UPPER QUADRANT COMPARISON:  None similar FINDINGS: Gallbladder: No gallstones or wall thickening visualized. No sonographic Murphy sign noted  by sonographer. Common bile duct: Diameter: 2 mm Liver: No focal lesion identified. Within normal limits in parenchymal echogenicity. Portal vein  is patent on color Doppler imaging with normal direction of blood flow towards the liver. IMPRESSION: Normal right upper quadrant ultrasound. Electronically Signed   By: Jorje Guild M.D.   On: 10/26/2020 10:16     MAU Course  Procedures NST: Baseline 140, mod variability, 10x10 accels, no decels  MDM   Assessment and Plan   1. [redacted] weeks gestation of pregnancy   2. RUQ pain   3. Abdominal pain affecting pregnancy    Labs, imaging reassuring. No fevers or increased WBC to suggest infectious process. Symptomatic treament for now - heating pad, rest, abdominal support. Return with worsening symptoms.  Truett Mainland 10/26/2020, 10:46 AM

## 2020-10-27 ENCOUNTER — Telehealth: Payer: Self-pay

## 2020-10-27 NOTE — Telephone Encounter (Signed)
Breast pump order signed and faxed.  Confirmation received

## 2020-10-28 ENCOUNTER — Ambulatory Visit (HOSPITAL_BASED_OUTPATIENT_CLINIC_OR_DEPARTMENT_OTHER): Payer: Managed Care, Other (non HMO) | Admitting: *Deleted

## 2020-10-28 ENCOUNTER — Ambulatory Visit: Payer: Managed Care, Other (non HMO) | Attending: Obstetrics | Admitting: *Deleted

## 2020-10-28 ENCOUNTER — Ambulatory Visit: Payer: Managed Care, Other (non HMO)

## 2020-10-28 ENCOUNTER — Other Ambulatory Visit: Payer: Managed Care, Other (non HMO)

## 2020-10-28 ENCOUNTER — Ambulatory Visit (HOSPITAL_BASED_OUTPATIENT_CLINIC_OR_DEPARTMENT_OTHER): Payer: Managed Care, Other (non HMO)

## 2020-10-28 ENCOUNTER — Encounter: Payer: Self-pay | Admitting: *Deleted

## 2020-10-28 ENCOUNTER — Other Ambulatory Visit: Payer: Self-pay

## 2020-10-28 VITALS — BP 147/79 | HR 75

## 2020-10-28 DIAGNOSIS — Z362 Encounter for other antenatal screening follow-up: Secondary | ICD-10-CM | POA: Diagnosis not present

## 2020-10-28 DIAGNOSIS — O99213 Obesity complicating pregnancy, third trimester: Secondary | ICD-10-CM | POA: Insufficient documentation

## 2020-10-28 DIAGNOSIS — O365930 Maternal care for other known or suspected poor fetal growth, third trimester, not applicable or unspecified: Secondary | ICD-10-CM | POA: Diagnosis not present

## 2020-10-28 DIAGNOSIS — O10913 Unspecified pre-existing hypertension complicating pregnancy, third trimester: Secondary | ICD-10-CM | POA: Insufficient documentation

## 2020-10-28 DIAGNOSIS — Z148 Genetic carrier of other disease: Secondary | ICD-10-CM | POA: Diagnosis not present

## 2020-10-28 DIAGNOSIS — Z3A28 28 weeks gestation of pregnancy: Secondary | ICD-10-CM

## 2020-10-28 DIAGNOSIS — O99013 Anemia complicating pregnancy, third trimester: Secondary | ICD-10-CM | POA: Diagnosis not present

## 2020-10-28 DIAGNOSIS — O10013 Pre-existing essential hypertension complicating pregnancy, third trimester: Secondary | ICD-10-CM | POA: Diagnosis not present

## 2020-10-28 NOTE — Procedures (Signed)
Shelby Chandler [birth year] [redacted]w[redacted]d  Fetus A Non-Stress Test Interpretation for 10/28/20  Indication: Chronic Hypertenstion  Fetal Heart Rate A Mode: External Baseline Rate (A): 130 bpm Variability: Moderate Accelerations: 10 x 10 Decelerations: None Multiple birth?: No  Uterine Activity Mode: Palpation, Toco Contraction Frequency (min): none Resting Tone Palpated: Relaxed  Interpretation (Fetal Testing) Nonstress Test Interpretation: Reactive Overall Impression: Reassuring for gestational age Comments: Dr. Donalee Citrin reviewed tracing

## 2020-10-28 NOTE — Progress Notes (Signed)
Dr. Donalee Citrin aware of elevated BP's.

## 2020-11-02 ENCOUNTER — Encounter: Payer: Managed Care, Other (non HMO) | Admitting: Licensed Clinical Social Worker

## 2020-11-02 ENCOUNTER — Telehealth: Payer: Self-pay

## 2020-11-02 NOTE — Telephone Encounter (Signed)
[employer] form signed and faxed for support stocking and sacroiliac support.   Confirmation received.

## 2020-11-03 ENCOUNTER — Ambulatory Visit (INDEPENDENT_AMBULATORY_CARE_PROVIDER_SITE_OTHER): Payer: Managed Care, Other (non HMO) | Admitting: Licensed Clinical Social Worker

## 2020-11-03 DIAGNOSIS — F439 Reaction to severe stress, unspecified: Secondary | ICD-10-CM

## 2020-11-04 ENCOUNTER — Other Ambulatory Visit: Payer: Self-pay

## 2020-11-04 ENCOUNTER — Inpatient Hospital Stay (HOSPITAL_COMMUNITY)
Admission: AD | Admit: 2020-11-04 | Discharge: 2020-11-04 | Disposition: A | Discharge status: Home / Self Care | Payer: Managed Care, Other (non HMO) | Attending: Obstetrics & Gynecology | Admitting: Obstetrics & Gynecology

## 2020-11-04 ENCOUNTER — Ambulatory Visit (HOSPITAL_BASED_OUTPATIENT_CLINIC_OR_DEPARTMENT_OTHER): Payer: Managed Care, Other (non HMO) | Admitting: *Deleted

## 2020-11-04 ENCOUNTER — Encounter: Payer: Self-pay | Admitting: *Deleted

## 2020-11-04 ENCOUNTER — Ambulatory Visit: Payer: Managed Care, Other (non HMO) | Admitting: *Deleted

## 2020-11-04 ENCOUNTER — Encounter (HOSPITAL_COMMUNITY): Payer: Self-pay | Admitting: Obstetrics & Gynecology

## 2020-11-04 ENCOUNTER — Ambulatory Visit (HOSPITAL_BASED_OUTPATIENT_CLINIC_OR_DEPARTMENT_OTHER): Payer: Managed Care, Other (non HMO)

## 2020-11-04 VITALS — BP 146/96 | HR 66

## 2020-11-04 DIAGNOSIS — Z3A29 29 weeks gestation of pregnancy: Secondary | ICD-10-CM | POA: Insufficient documentation

## 2020-11-04 DIAGNOSIS — O365930 Maternal care for other known or suspected poor fetal growth, third trimester, not applicable or unspecified: Secondary | ICD-10-CM | POA: Insufficient documentation

## 2020-11-04 DIAGNOSIS — O10913 Unspecified pre-existing hypertension complicating pregnancy, third trimester: Secondary | ICD-10-CM | POA: Insufficient documentation

## 2020-11-04 DIAGNOSIS — O99213 Obesity complicating pregnancy, third trimester: Secondary | ICD-10-CM

## 2020-11-04 DIAGNOSIS — O368130 Decreased fetal movements, third trimester, not applicable or unspecified: Secondary | ICD-10-CM | POA: Diagnosis present

## 2020-11-04 DIAGNOSIS — O99013 Anemia complicating pregnancy, third trimester: Secondary | ICD-10-CM

## 2020-11-04 DIAGNOSIS — O365990 Maternal care for other known or suspected poor fetal growth, unspecified trimester, not applicable or unspecified: Secondary | ICD-10-CM

## 2020-11-04 DIAGNOSIS — Z7982 Long term (current) use of aspirin: Secondary | ICD-10-CM | POA: Diagnosis not present

## 2020-11-04 DIAGNOSIS — O10013 Pre-existing essential hypertension complicating pregnancy, third trimester: Secondary | ICD-10-CM | POA: Insufficient documentation

## 2020-11-04 DIAGNOSIS — E669 Obesity, unspecified: Secondary | ICD-10-CM

## 2020-11-04 HISTORY — DX: Anxiety disorder, unspecified: F41.9

## 2020-11-04 LAB — ABO/RH: ABO/RH(D): O POS

## 2020-11-04 LAB — COMPREHENSIVE METABOLIC PANEL
ALT: 14 U/L (ref 0–44)
AST: 16 U/L (ref 15–41)
Albumin: 2.6 g/dL — ABNORMAL LOW (ref 3.5–5.0)
Alkaline Phosphatase: 121 U/L (ref 38–126)
Anion gap: 9 (ref 5–15)
BUN: <5 mg/dL — ABNORMAL LOW (ref 6–20)
CO2: 20 mmol/L — ABNORMAL LOW (ref 22–32)
Calcium: 8.7 mg/dL — ABNORMAL LOW (ref 8.9–10.3)
Chloride: 104 mmol/L (ref 98–111)
Creatinine, Ser: 0.75 mg/dL (ref 0.44–1.00)
GFR, Estimated: >60 mL/min (ref >60)
Glucose, Bld: 85 mg/dL (ref 70–99)
Potassium: 3.3 mmol/L — ABNORMAL LOW (ref 3.5–5.1)
Sodium: 133 mmol/L — ABNORMAL LOW (ref 135–145)
Total Bilirubin: 0.4 mg/dL (ref 0.3–1.2)
Total Protein: 7 g/dL (ref 6.5–8.1)

## 2020-11-04 LAB — CBC
HCT: 20.1 % — ABNORMAL LOW (ref 36.0–46.0)
HCT: 38.5 % (ref 36.0–46.0)
Hemoglobin: 6.1 g/dL — CL (ref 12.0–15.0)
Hemoglobin: 12.2 g/dL (ref 12.0–15.0)
MCH: 23.6 pg — ABNORMAL LOW (ref 26.0–34.0)
MCH: 23.9 pg — ABNORMAL LOW (ref 26.0–34.0)
MCHC: 30.3 g/dL (ref 30.0–36.0)
MCHC: 31.7 g/dL (ref 30.0–36.0)
MCV: 77.9 fL — ABNORMAL LOW (ref 80.0–100.0)
MCV: 75.5 fL — ABNORMAL LOW (ref 80.0–100.0)
Platelets: 271 10*3/uL (ref 150–400)
Platelets: 585 10*3/uL — ABNORMAL HIGH (ref 150–400)
RBC: 2.58 MIL/uL — ABNORMAL LOW (ref 3.87–5.11)
RBC: 5.1 MIL/uL (ref 3.87–5.11)
RDW: 15.7 % — ABNORMAL HIGH (ref 11.5–15.5)
RDW: 15.9 % — ABNORMAL HIGH (ref 11.5–15.5)
WBC: 5.3 10*3/uL (ref 4.0–10.5)
WBC: 10.3 10*3/uL (ref 4.0–10.5)
nRBC: 0 % (ref 0.0–0.2)
nRBC: 0 % (ref 0.0–0.2)

## 2020-11-04 LAB — TYPE AND SCREEN
ABO/RH(D): O POS
Antibody Screen: NEGATIVE

## 2020-11-04 LAB — PROTEIN / CREATININE RATIO, URINE
Creatinine, Urine: 43.8 mg/dL
Protein Creatinine Ratio: 0.18 mg/mg{Cre} — ABNORMAL HIGH (ref 0.00–0.15)
Total Protein, Urine: 8 mg/dL

## 2020-11-04 MED ORDER — LACTATED RINGERS IV SOLN: INTRAVENOUS | Status: DC

## 2020-11-04 MED ORDER — BETAMETHASONE SOD PHOS & ACET 6 (3-3) MG/ML IJ SUSP
12.0000 mg | INTRAMUSCULAR | Status: DC
Start: 2020-11-04 — End: 2020-11-04
  Administered 2020-11-04: 12 mg via INTRAMUSCULAR
  Filled 2020-11-04: qty 5

## 2020-11-04 NOTE — Progress Notes (Signed)
Patient discharged as per provider order. Discharge instructions given. Patient informed to return to MAU tomorrow for second dose of betamethasone and an NST. Patient verbalized understanding and agreed.

## 2020-11-04 NOTE — MAU Note (Signed)
Went to get an [place]. Baby isn't gaining wt, fluid is low, less movement, ? Placenta and blood flow.  Sent in for steroids, monitoring and blood work.

## 2020-11-04 NOTE — MAU Provider Note (Signed)
Patient Shelby Chandler is a 30 y.o. G1P0000  At [redacted]w[redacted]d here for 2nd dose of BMZ, pre-e labs and NST. Patient denies HA, blurry vision, floating spots, RUQ pain. SHe denies vaginal bleeding, LOF, contractions, cough, fever. She describes that her fetal movements are decreased now that baby "has moved around and is not kicking my cervix".   History     CSN: [id number]  Arrival date and time: 11/04/20 3790   Event Date/Time   First Provider Initiated Contact with Patient 11/04/20 1004      Chief Complaint  Patient presents with   for fetal observation   Decreased Fetal Movement   HPI  OB History     Gravida  1   Para  0   Term  0   Preterm      AB  0   Living         SAB  0   IAB  0   Ectopic  0   Multiple  0   Live Births              Past Medical History:  Diagnosis Date   Anemia    Anxiety    Hypertension     Past Surgical History:  Procedure Laterality Date   TOOTH EXTRACTION      Family History  Problem Relation Age of Onset   Alcohol abuse Mother    Arthritis Mother    Depression Mother    Drug abuse Mother    Heart attack Mother    Hypertension Mother    Hyperlipidemia Mother    Alcohol abuse Father    Diabetes Father    Drug abuse Father    Hypertension Father    Heart attack Father    Stroke Father    Hypertension Sister    Arthritis Maternal Grandmother    Arthritis Maternal Grandfather    Diabetes Paternal Grandmother     Social History   Tobacco Use   Smoking status: Never   Smokeless tobacco: Never  Vaping Use   Vaping Use: Never used  Substance Use Topics   Alcohol use: Not Currently   Drug use: No    Allergies: No Known Allergies  Medications Prior to Admission  Medication Sig Dispense Refill Last Dose   aspirin EC 81 MG tablet Take 1 tablet (81 mg total) by mouth daily. Swallow whole. 30 tablet 6 11/04/2020 at 0630   cyclobenzaprine (FLEXERIL) 10 MG tablet Take 1 tablet (10 mg total) by mouth 3  (three) times daily as needed for muscle spasms. 30 tablet 2 Past Week   Ferrous Sulfate 220 (44 Fe) MG/5ML SOLN Take 5 mLs by mouth every other day. 200 mL 2 11/04/2020 at 0630   NIFEdipine (PROCARDIA XL) 90 MG 24 hr tablet Take 1 tablet (90 mg total) by mouth daily. 90 tablet 3 11/04/2020 at 0630   Prenatal MV-Min-Fe Fum-FA-DHA (PRENATAL 1 PO) Take 1 tablet by mouth daily. Gummies/Unknown strenght   11/03/2020   propranolol (INDERAL) 80 MG tablet Take 1 tablet (80 mg total) by mouth 2 (two) times daily. 180 tablet 3 11/04/2020 at 0630   Blood Pressure Monitoring (BLOOD PRESSURE CUFF) MISC Use as directed for checking bp daily. (Patient taking differently: 1 each by Other route as directed. Use as directed for checking bp daily.) 1 each 0     Review of Systems  Constitutional: Negative.   HENT: Negative.    Respiratory: Negative.    Cardiovascular: Negative.   Gastrointestinal:  Negative.   Genitourinary: Negative.   Musculoskeletal: Negative.   Neurological: Negative.   Hematological: Negative.   Psychiatric/Behavioral: Negative.    Physical Exam   Blood pressure (!) 144/91, pulse 77, temperature 97.6 F (36.4 C), temperature source Oral, height 5\' 5"  (1.651 m), weight (!) 198.2 kg, last menstrual period 04/13/2020, SpO2 100 %.  Physical Exam Constitutional:      Appearance: Normal appearance.  Pulmonary:     Effort: Pulmonary effort is normal.  Abdominal:     General: Abdomen is flat.  Musculoskeletal:        General: Normal range of motion.  Skin:    General: Skin is warm.  Neurological:     General: No focal deficit present.     Mental Status: She is alert.    MAU Course  Procedures  MDM -NST: 150 bpm, mod var, no acels, no decels, no contractions -Patient PCR and CMP are normal  -patient received 1st dose of steroids Assessment and Plan   Initially plan was made to admit and observe patient overnight due to NR NST, however, NICU was full and NICU attending could not  guarantee that infant would be able to remain in [facility] if delivered. D/w with MFM , who is comfortable with patient being discharged and returning tomorrow at 9 am for repeat NST and 2nd dose of BMZ. If NST is not reassuring tomorrow, will order BPP and Dopplers. Dr Roselie Awkward aware and agrees with plan of care.   Reassessment (2:53 PM) -Patient had Hgb of 6.9; will repeat>Repeat at 1501 is 12.2  1. Pregnancy affected by fetal growth restriction   -patient stable for discharge with plan to return tomorrow for NST and 2nd dose of BMZ. Warning signs of pre-e reviewed; patient verbalized understanding of when to return to MAU -all questions answered    Shelby Chandler 11/04/2020, 2:13 PM

## 2020-11-04 NOTE — BH Specialist Note (Signed)
Integrated Behavioral Health via Telemedicine Visit  11/04/2020 Raja Caputi Luiz [id number]  Number of [place] visits: 1 Session Start time: 1:06pm   Session End time: 1:22pm Total time: 16 mins via mychart   Referring Provider:  Patient/Family location: Work  [facility] Provider location: [facility] All persons participating in visit: Pt T Stenglein and LCSW A. Linton Rump  Types of Service: [place] (BHI)  I connected with Caryl Pina Lager and/or Caryl Pina Loscalzo's n/a via  Telephone or Video Enabled Telemedicine Application  (Video is Caregility application) and verified that I am speaking with the correct person using two identifiers. Discussed confidentiality: Yes   I discussed the limitations of telemedicine and the availability of in person appointments.  Discussed there is a possibility of technology failure and discussed alternative modes of communication if that failure occurs.  I discussed that engaging in this telemedicine visit, they consent to the provision of behavioral healthcare and the services will be billed under their insurance.  Patient and/or legal guardian expressed understanding and consented to Telemedicine visit: Yes   Presenting Concerns: Patient and/or family reports the following symptoms/concerns: stress  Duration of problem: approx one month ; Severity of problem: mild  Patient and/or Family's Strengths/Protective Factors: Concrete supports in place (healthy food, safe environments, etc.)  Goals Addressed: Patient will:  Reduce symptoms of: stress   Increase knowledge and/or ability of: stress reduction   Demonstrate ability to: Increase healthy adjustment to current life circumstances  Progress towards Goals: Ongoing  Interventions: Interventions utilized:  Supportive Counseling Standardized Assessments completed: PHQ 9  Patient and/or Family Response: Ms. Depaoli reports feeling overwhelm  with various professional responsibilities and maintaining a healthy pregnancy.   Assessment: Patient currently experiencing situational stress .   Patient may benefit from integrated behavioral health .  Plan: Follow up with behavioral health clinician on : as needed  Behavioral recommendations: Prioritize task to prevent burnout, rest and engage in self care  Referral(s): [place] (In Clinic)  I discussed the assessment and treatment plan with the patient and/or parent/guardian. They were provided an opportunity to ask questions and all were answered. They agreed with the plan and demonstrated an understanding of the instructions.   They were advised to call back or seek an in-person evaluation if the symptoms worsen or if the condition fails to improve as anticipated.  Lynnea Ferrier, LCSW

## 2020-11-04 NOTE — Procedures (Signed)
Shelby Chandler 04-01-1990 [redacted]w[redacted]d  Fetus A Non-Stress Test Interpretation for 11/04/20  Indication: IUGR  Fetal Heart Rate A Mode: External Baseline Rate (A): 140 bpm Variability: Minimal Accelerations: None Decelerations:  (one decel FHR decreased to 130 x 30 sec not in relation to uc) Multiple birth?: No  Uterine Activity Mode: Palpation, Toco Contraction Frequency (min): none Resting Tone Palpated: Relaxed  Interpretation (Fetal Testing) Nonstress Test Interpretation: Non-reactive Overall Impression: Non-reassuring Comments: Dr. Donalee Citrin reviewed tracing, patient sent to [facility] MAU for prolonged fetal monitoring.

## 2020-11-05 ENCOUNTER — Inpatient Hospital Stay (HOSPITAL_COMMUNITY)
Admission: AD | Admit: 2020-11-05 | Discharge: 2020-11-05 | Disposition: A | Discharge status: Home / Self Care | Payer: Managed Care, Other (non HMO) | Attending: Obstetrics & Gynecology | Admitting: Obstetrics & Gynecology

## 2020-11-05 ENCOUNTER — Telehealth: Payer: Self-pay

## 2020-11-05 ENCOUNTER — Other Ambulatory Visit: Payer: Self-pay

## 2020-11-05 ENCOUNTER — Encounter (HOSPITAL_COMMUNITY): Payer: Self-pay | Admitting: Obstetrics & Gynecology

## 2020-11-05 DIAGNOSIS — Z79899 Other long term (current) drug therapy: Secondary | ICD-10-CM | POA: Insufficient documentation

## 2020-11-05 DIAGNOSIS — Z3A29 29 weeks gestation of pregnancy: Secondary | ICD-10-CM

## 2020-11-05 DIAGNOSIS — O99213 Obesity complicating pregnancy, third trimester: Secondary | ICD-10-CM | POA: Diagnosis not present

## 2020-11-05 DIAGNOSIS — Z7982 Long term (current) use of aspirin: Secondary | ICD-10-CM | POA: Insufficient documentation

## 2020-11-05 DIAGNOSIS — Z7901 Long term (current) use of anticoagulants: Secondary | ICD-10-CM | POA: Insufficient documentation

## 2020-11-05 DIAGNOSIS — O10919 Unspecified pre-existing hypertension complicating pregnancy, unspecified trimester: Secondary | ICD-10-CM

## 2020-11-05 DIAGNOSIS — Z3689 Encounter for other specified antenatal screening: Secondary | ICD-10-CM | POA: Diagnosis not present

## 2020-11-05 DIAGNOSIS — O365930 Maternal care for other known or suspected poor fetal growth, third trimester, not applicable or unspecified: Secondary | ICD-10-CM | POA: Diagnosis not present

## 2020-11-05 DIAGNOSIS — O10913 Unspecified pre-existing hypertension complicating pregnancy, third trimester: Secondary | ICD-10-CM | POA: Insufficient documentation

## 2020-11-05 LAB — URINALYSIS, ROUTINE W REFLEX MICROSCOPIC
Bilirubin Urine: NEGATIVE
Glucose, UA: NEGATIVE mg/dL
Hgb urine dipstick: NEGATIVE
Ketones, ur: NEGATIVE mg/dL
Nitrite: NEGATIVE
Protein, ur: 30 mg/dL — AB
Specific Gravity, Urine: 1.016 (ref 1.005–1.030)
pH: 6 (ref 5.0–8.0)

## 2020-11-05 LAB — RPR: RPR Ser Ql: NONREACTIVE

## 2020-11-05 MED ORDER — BETAMETHASONE SOD PHOS & ACET 6 (3-3) MG/ML IJ SUSP
12.0000 mg | Freq: Once | INTRAMUSCULAR | Status: AC
  Administered 2020-11-05: 12 mg via INTRAMUSCULAR

## 2020-11-05 NOTE — MAU Provider Note (Signed)
History     CSN: [id number]  Arrival date and time: 11/05/20 4315   Event Date/Time   First Provider Initiated Contact with Patient 11/05/20 0815      Chief Complaint  Patient presents with   Injections   Fetal monitoring   30 y.o. G1 @29 .3 wks with hx of CHTN presenting for second BMZ injection. She was seen yesterday in MAU for PEC workup and first dose of BMZ. She is currently taking Procardia 90 mg XL and Inderal 80 mg bid and she took both this am. She reports no FM prior to arrival but is feeling good movement now. Denies HA, visual disturbances, RUQ pain, SOB, and CP. Her pregnancy is also complicated by morbid obesity, hx HSV, PSVT, severe FGR, and anxiety/depression.    OB History     Gravida  1   Para  0   Term  0   Preterm      AB  0   Living         SAB  0   IAB  0   Ectopic  0   Multiple  0   Live Births              Past Medical History:  Diagnosis Date   Anemia    Anxiety    Hypertension     Past Surgical History:  Procedure Laterality Date   TOOTH EXTRACTION      Family History  Problem Relation Age of Onset   Alcohol abuse Mother    Arthritis Mother    Depression Mother    Drug abuse Mother    Heart attack Mother    Hypertension Mother    Hyperlipidemia Mother    Alcohol abuse Father    Diabetes Father    Drug abuse Father    Hypertension Father    Heart attack Father    Stroke Father    Hypertension Sister    Arthritis Maternal Grandmother    Arthritis Maternal Grandfather    Diabetes Paternal Grandmother     Social History   Tobacco Use   Smoking status: Never   Smokeless tobacco: Never  Vaping Use   Vaping Use: Never used  Substance Use Topics   Alcohol use: Not Currently   Drug use: No    Allergies: No Known Allergies  Medications Prior to Admission  Medication Sig Dispense Refill Last Dose   aspirin EC 81 MG tablet Take 1 tablet (81 mg total) by mouth daily. Swallow whole. 30 tablet 6    Blood  Pressure Monitoring (BLOOD PRESSURE CUFF) MISC Use as directed for checking bp daily. (Patient taking differently: 1 each by Other route as directed. Use as directed for checking bp daily.) 1 each 0    cyclobenzaprine (FLEXERIL) 10 MG tablet Take 1 tablet (10 mg total) by mouth 3 (three) times daily as needed for muscle spasms. 30 tablet 2    Ferrous Sulfate 220 (44 Fe) MG/5ML SOLN Take 5 mLs by mouth every other day. 200 mL 2    NIFEdipine (PROCARDIA XL) 90 MG 24 hr tablet Take 1 tablet (90 mg total) by mouth daily. 90 tablet 3    Prenatal MV-Min-Fe Fum-FA-DHA (PRENATAL 1 PO) Take 1 tablet by mouth daily. Gummies/Unknown strenght      propranolol (INDERAL) 80 MG tablet Take 1 tablet (80 mg total) by mouth 2 (two) times daily. 180 tablet 3     Review of Systems  Eyes:  Negative for visual disturbance.  Respiratory:  Negative for shortness of breath.   Cardiovascular:  Negative for chest pain.  Gastrointestinal:  Negative for abdominal pain.  Genitourinary:  Negative for vaginal discharge.  Neurological:  Negative for headaches.  Physical Exam   Blood pressure (!) 152/82, pulse 60, temperature 97.8 F (36.6 C), temperature source Oral, resp. rate 16, height 5\' 5"  (1.651 m), weight (!) 198.1 kg, last menstrual period 04/13/2020, SpO2 100 %. Patient Vitals for the past 24 hrs:  BP Temp Temp src Pulse Resp SpO2 Height Weight  11/05/20 0916 130/71 -- -- 83 15 100 % -- --  11/05/20 0901 133/71 -- -- 75 -- -- -- --  11/05/20 0846 (!) 159/87 -- -- 71 -- -- -- --  11/05/20 0831 (!) 159/87 -- -- 74 -- -- -- --  11/05/20 0816 (!) 151/85 -- -- 66 -- -- -- --  11/05/20 0801 (!) 152/82 -- -- 60 -- -- -- --  11/05/20 0753 (!) 150/87 97.8 F (36.6 C) Oral 78 16 100 % -- --  11/05/20 0740 -- -- -- -- -- -- 5\' 5"  (1.651 m) (!) 198.1 kg    Physical Exam Vitals and nursing note reviewed.  Constitutional:      General: She is not in acute distress.    Appearance: Normal appearance.  HENT:     Head:  Normocephalic and atraumatic.  Cardiovascular:     Rate and Rhythm: Normal rate.  Pulmonary:     Effort: Pulmonary effort is normal. No respiratory distress.  Musculoskeletal:        General: Normal range of motion.     Cervical back: Normal range of motion.  Skin:    General: Skin is warm and dry.  Neurological:     General: No focal deficit present.     Mental Status: She is alert and oriented to person, place, and time.  Psychiatric:        Mood and Affect: Mood normal.        Behavior: Behavior normal.  EFM: 135 bpm, mod variability, + accels, no decels Toco: none  Results for orders placed or performed during the hospital encounter of 11/05/20 (from the past 24 hour(s))  Urinalysis, Routine w reflex microscopic Urine, Clean Catch     Status: Abnormal   Collection Time: 11/05/20  7:44 AM  Result Value Ref Range   Color, Urine YELLOW YELLOW   APPearance CLOUDY (A) CLEAR   Specific Gravity, Urine 1.016 1.005 - 1.030   pH 6.0 5.0 - 8.0   Glucose, UA NEGATIVE NEGATIVE mg/dL   Hgb urine dipstick NEGATIVE NEGATIVE   Bilirubin Urine NEGATIVE NEGATIVE   Ketones, ur NEGATIVE NEGATIVE mg/dL   Protein, ur 30 (A) NEGATIVE mg/dL   Nitrite NEGATIVE NEGATIVE   Leukocytes,Ua LARGE (A) NEGATIVE   RBC / HPF 0-5 0 - 5 RBC/hpf   WBC, UA 11-20 0 - 5 WBC/hpf   Bacteria, UA FEW (A) NONE SEEN   Squamous Epithelial / LPF 6-10 0 - 5   Mucus PRESENT    Amorphous Crystal PRESENT    MAU Course  Procedures BMZ x1  MDM NST reactive, pt marked FM >10 times in first hr. BMZ given. BP initially high then became normotensive. Consult with Dr. Roselie Awkward, ok for discharge home. Follow up tomorrow as scheduled.   Assessment and Plan   1. [redacted] weeks gestation of pregnancy   2. NST (non-stress test) reactive   3. Chronic hypertension affecting pregnancy    Discharge home Follow up  at [place] precautions  Allergies as of 11/05/2020   No Known Allergies      Medication List     TAKE  these medications    aspirin EC 81 MG tablet Take 1 tablet (81 mg total) by mouth daily. Swallow whole.   Blood Pressure Cuff Misc Use as directed for checking bp daily. What changed:  how much to take how to take this when to take this   cyclobenzaprine 10 MG tablet Commonly known as: FLEXERIL Take 1 tablet (10 mg total) by mouth 3 (three) times daily as needed for muscle spasms.   Ferrous Sulfate 220 (44 Fe) MG/5ML Soln Take 5 mLs by mouth every other day.   NIFEdipine 90 MG 24 hr tablet Commonly known as: Procardia XL Take 1 tablet (90 mg total) by mouth daily.   PRENATAL 1 PO Take 1 tablet by mouth daily. Gummies/Unknown strenght   propranolol 80 MG tablet Commonly known as: INDERAL Take 1 tablet (80 mg total) by mouth 2 (two) times daily.        Julianne Handler, CNM 11/05/2020, 8:24 AM

## 2020-11-05 NOTE — MAU Note (Signed)
Shelby Chandler is a 30 y.o. at [redacted]w[redacted]d here in MAU reporting: here for 2nd dose of BMZ and NST. No pain, bleeding, or LOF. Good FM last night but none so far this morning. Reports she did eat breakfast this morning.  Onset of complaint: ongoing  Pain score: 0/10  Vitals:   11/05/20 0753  BP: (!) 150/87  Pulse: 78  Resp: 16  Temp: 97.8 F (36.6 C)  SpO2: 100%     FHT: EFM applied in room  Lab orders placed from triage: UA

## 2020-11-05 NOTE — Telephone Encounter (Signed)
Received call from [place] at the blood bank. Vaughan Basta states pt was given Rhogam on 9/16 at her ROB appt at [facility]. Pt has O Pos blood type and did not need Rhogam. See media for pt's blood type and pt had confirmation of blood type with last labs.   Routing to [facility] office.  Colletta Maryland, RN

## 2020-11-06 ENCOUNTER — Other Ambulatory Visit: Payer: Self-pay | Admitting: *Deleted

## 2020-11-06 ENCOUNTER — Ambulatory Visit (INDEPENDENT_AMBULATORY_CARE_PROVIDER_SITE_OTHER): Payer: Managed Care, Other (non HMO) | Admitting: Obstetrics and Gynecology

## 2020-11-06 ENCOUNTER — Encounter: Payer: Self-pay | Admitting: Obstetrics and Gynecology

## 2020-11-06 VITALS — BP 149/81 | HR 75 | Wt >= 6400 oz

## 2020-11-06 DIAGNOSIS — O365990 Maternal care for other known or suspected poor fetal growth, unspecified trimester, not applicable or unspecified: Secondary | ICD-10-CM

## 2020-11-06 DIAGNOSIS — O0990 Supervision of high risk pregnancy, unspecified, unspecified trimester: Secondary | ICD-10-CM

## 2020-11-06 DIAGNOSIS — O10012 Pre-existing essential hypertension complicating pregnancy, second trimester: Secondary | ICD-10-CM

## 2020-11-06 LAB — CULTURE, OB URINE: Culture: >=100000 — AB

## 2020-11-06 LAB — OB RESULTS CONSOLE ABO/RH: RH Type: POSITIVE

## 2020-11-06 NOTE — Progress Notes (Signed)
Subjective:  Shelby Chandler is a 30 y.o. G1P0000 at [redacted]w[redacted]d being seen today for ongoing prenatal care.  She is currently monitored for the following issues for this high-risk pregnancy and has Essential hypertension; Iron deficiency anemia due to chronic blood loss; Class 3 severe obesity due to excess calories without serious comorbidity with body mass index (BMI) of 60.0 to 69.9 in adult [facility]); Anxiety and depression; Chronic low back pain without sciatica; History of anemia; HSV-2 infection; Vitamin B12 deficiency; Absolute anemia; Supervision of high risk pregnancy, antepartum; Hypertension during pregnancy in second trimester; Morbid obesity ([place]); PSVT (paroxysmal supraventricular tachycardia) ([place]); BMI 70 and over, adult [facility]); and Pregnancy affected by fetal growth restriction on their problem list.  Patient reports no HA or visual changes.  Contractions: Not present. Vag. Bleeding: None.  Movement: (!) Decreased. Denies leaking of fluid.   The following portions of the patient's history were reviewed and updated as appropriate: allergies, current medications, past family history, past medical history, past social history, past surgical history and problem list. Problem list updated.  Objective:   Vitals:   11/06/20 1054 11/06/20 1059  BP: (!) 168/93 (!) 149/81  Pulse: 75 75  Weight: (!) 440 lb (199.6 kg)     Fetal Status: Fetal Heart Rate (bpm): 131   Movement: (!) Decreased     General:  Alert, oriented and cooperative. Patient is in no acute distress.  Skin: Skin is warm and dry. No rash noted.   Cardiovascular: Normal heart rate noted  Respiratory: Normal respiratory effort, no problems with respiration noted  Abdomen: Soft, gravid, appropriate for gestational age. Pain/Pressure: Absent     Pelvic:  Cervical exam deferred        Extremities: Normal range of motion.  Edema: None  Mental Status: Normal mood and affect. Normal behavior. Normal judgment and thought content.    Urinalysis:      Assessment and Plan:  Pregnancy: G1P0000 at [redacted]w[redacted]d  1. Supervision of high risk pregnancy, antepartum Stable  2. Pregnancy affected by fetal growth restriction Serial growth scans and weekly antenatal testing per MFM S/P BMZ 9/28 and 9/29  3. Pre-existing essential hypertension during pregnancy in second trimester BP stable See above Continue with current management  4. Morbid obesity ([place]) Stable  Preterm labor symptoms and general obstetric precautions including but not limited to vaginal bleeding, contractions, leaking of fluid and fetal movement were reviewed in detail with the patient. Please refer to After Visit Summary for other counseling recommendations.  Return in about 1 week (around 11/13/2020) for OB visit, face to face, MD only.   Chancy Milroy, MD

## 2020-11-06 NOTE — Progress Notes (Unsigned)
U

## 2020-11-06 NOTE — Progress Notes (Addendum)
HROB, c/o decreased fetal movement, she had NST and BPP done on the 28th.

## 2020-11-09 ENCOUNTER — Other Ambulatory Visit: Payer: Self-pay

## 2020-11-09 ENCOUNTER — Encounter (HOSPITAL_COMMUNITY): Payer: Self-pay | Admitting: Family Medicine

## 2020-11-09 ENCOUNTER — Inpatient Hospital Stay (HOSPITAL_COMMUNITY)
Admission: AD | Admit: 2020-11-09 | Discharge: 2020-11-09 | Disposition: A | Discharge status: Home / Self Care | Payer: Managed Care, Other (non HMO) | Attending: Family Medicine | Admitting: Family Medicine

## 2020-11-09 ENCOUNTER — Ambulatory Visit (HOSPITAL_BASED_OUTPATIENT_CLINIC_OR_DEPARTMENT_OTHER): Payer: Managed Care, Other (non HMO)

## 2020-11-09 ENCOUNTER — Ambulatory Visit (HOSPITAL_BASED_OUTPATIENT_CLINIC_OR_DEPARTMENT_OTHER): Payer: Managed Care, Other (non HMO) | Admitting: *Deleted

## 2020-11-09 ENCOUNTER — Inpatient Hospital Stay (HOSPITAL_BASED_OUTPATIENT_CLINIC_OR_DEPARTMENT_OTHER): Payer: Managed Care, Other (non HMO) | Admitting: Obstetrics

## 2020-11-09 ENCOUNTER — Ambulatory Visit: Payer: Managed Care, Other (non HMO) | Admitting: *Deleted

## 2020-11-09 VITALS — BP 150/87 | HR 74

## 2020-11-09 VITALS — BP 152/102 | HR 67

## 2020-11-09 DIAGNOSIS — O99213 Obesity complicating pregnancy, third trimester: Secondary | ICD-10-CM | POA: Insufficient documentation

## 2020-11-09 DIAGNOSIS — O365930 Maternal care for other known or suspected poor fetal growth, third trimester, not applicable or unspecified: Secondary | ICD-10-CM

## 2020-11-09 DIAGNOSIS — O365990 Maternal care for other known or suspected poor fetal growth, unspecified trimester, not applicable or unspecified: Secondary | ICD-10-CM | POA: Insufficient documentation

## 2020-11-09 DIAGNOSIS — D649 Anemia, unspecified: Secondary | ICD-10-CM

## 2020-11-09 DIAGNOSIS — Z3A30 30 weeks gestation of pregnancy: Secondary | ICD-10-CM

## 2020-11-09 DIAGNOSIS — O99013 Anemia complicating pregnancy, third trimester: Secondary | ICD-10-CM

## 2020-11-09 DIAGNOSIS — O10913 Unspecified pre-existing hypertension complicating pregnancy, third trimester: Secondary | ICD-10-CM | POA: Insufficient documentation

## 2020-11-09 DIAGNOSIS — Z7982 Long term (current) use of aspirin: Secondary | ICD-10-CM | POA: Insufficient documentation

## 2020-11-09 DIAGNOSIS — O365931 Maternal care for other known or suspected poor fetal growth, third trimester, fetus 1: Secondary | ICD-10-CM | POA: Diagnosis not present

## 2020-11-09 DIAGNOSIS — O10013 Pre-existing essential hypertension complicating pregnancy, third trimester: Secondary | ICD-10-CM

## 2020-11-09 DIAGNOSIS — E669 Obesity, unspecified: Secondary | ICD-10-CM | POA: Diagnosis not present

## 2020-11-09 DIAGNOSIS — O368130 Decreased fetal movements, third trimester, not applicable or unspecified: Secondary | ICD-10-CM | POA: Insufficient documentation

## 2020-11-09 DIAGNOSIS — O10012 Pre-existing essential hypertension complicating pregnancy, second trimester: Secondary | ICD-10-CM

## 2020-11-09 LAB — COMPREHENSIVE METABOLIC PANEL
ALT: 15 U/L (ref 0–44)
AST: 16 U/L (ref 15–41)
Albumin: 2.8 g/dL — ABNORMAL LOW (ref 3.5–5.0)
Alkaline Phosphatase: 136 U/L — ABNORMAL HIGH (ref 38–126)
Anion gap: 9 (ref 5–15)
BUN: 13 mg/dL (ref 6–20)
CO2: 21 mmol/L — ABNORMAL LOW (ref 22–32)
Calcium: 8.7 mg/dL — ABNORMAL LOW (ref 8.9–10.3)
Chloride: 104 mmol/L (ref 98–111)
Creatinine, Ser: 0.81 mg/dL (ref 0.44–1.00)
GFR, Estimated: >60 mL/min (ref >60)
Glucose, Bld: 73 mg/dL (ref 70–99)
Potassium: 3.4 mmol/L — ABNORMAL LOW (ref 3.5–5.1)
Sodium: 134 mmol/L — ABNORMAL LOW (ref 135–145)
Total Bilirubin: 0.3 mg/dL (ref 0.3–1.2)
Total Protein: 7.2 g/dL (ref 6.5–8.1)

## 2020-11-09 LAB — CBC
HCT: 37.8 % (ref 36.0–46.0)
Hemoglobin: 11.9 g/dL — ABNORMAL LOW (ref 12.0–15.0)
MCH: 23.6 pg — ABNORMAL LOW (ref 26.0–34.0)
MCHC: 31.5 g/dL (ref 30.0–36.0)
MCV: 75 fL — ABNORMAL LOW (ref 80.0–100.0)
Platelets: 511 10*3/uL — ABNORMAL HIGH (ref 150–400)
RBC: 5.04 MIL/uL (ref 3.87–5.11)
RDW: 15.6 % — ABNORMAL HIGH (ref 11.5–15.5)
WBC: 16.1 10*3/uL — ABNORMAL HIGH (ref 4.0–10.5)
nRBC: 0 % (ref 0.0–0.2)

## 2020-11-09 LAB — PROTEIN / CREATININE RATIO, URINE
Creatinine, Urine: 140.27 mg/dL
Protein Creatinine Ratio: 0.55 mg/mg{Cre} — ABNORMAL HIGH (ref 0.00–0.15)
Total Protein, Urine: 77 mg/dL

## 2020-11-09 MED ORDER — FAMOTIDINE 20 MG PO TABS
40.0000 mg | ORAL_TABLET | Freq: Once | ORAL | Status: AC
  Administered 2020-11-09: 40 mg via ORAL
  Filled 2020-11-09: qty 2

## 2020-11-09 NOTE — MAU Note (Signed)
Sent for fetal monitoring. Non reactive NST at MFM appointment . Decreased fetal movement on U/S and pt not feeling baby move. Shelby Chandler is not growing according to U/S as well. Sent for prolonged monitoring and PRe -E work up. Pt denies any visual changes or headache.

## 2020-11-09 NOTE — Procedures (Signed)
Shelby Chandler 12-16-1990 [redacted]w[redacted]d  Fetus A Non-Stress Test Interpretation for 11/09/20  Indication: IUGR, CHTN, morbid obesity  Fetal Heart Rate A Mode: External Baseline Rate (A): 135 bpm Variability: Moderate Accelerations: None Decelerations: None Multiple birth?: No  Uterine Activity Mode: Palpation Contraction Frequency (min): None Resting Tone Palpated: Relaxed Resting Time: Adequate  Interpretation (Fetal Testing) Nonstress Test Interpretation: Non-reactive Comments: Dr. Annamaria Boots reviewed tracing. Patient does not feel FM. 1 definite, maybe 2 movements felt by RN when cardio held X 59mins. Dr. Annamaria Boots discussing admission.

## 2020-11-09 NOTE — MAU Note (Signed)
30 y.o. G1P0 presents to MAU with complaints of HTN and decreased fetal movement. Pt reports nausea related to reflux. BP elevated at 145/95. Fetal monitors placed; RN to monitor.
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
## 2020-11-09 NOTE — MAU Provider Note (Signed)
History     CSN: [id number]  Arrival date and time: 11/09/20 1337   Event Date/Time   First Provider Initiated Contact with Patient 11/09/20 1433      Chief Complaint  Patient presents with   Hypertension   HPI This is a 30yo G1 at 30wks today. Pregnancy complicated by [facility], FGR at 2.4% with absent end diastolic flow. She was seen at MFM earlier today, reported decreased fetal movement since midnight last night. She was put on the monitor and the NST was not reactive. She was sent here for further evaluation. No palliating or provoking factors.   OB History     Gravida  1   Para  0   Term  0   Preterm      AB  0   Living         SAB  0   IAB  0   Ectopic  0   Multiple  0   Live Births              Past Medical History:  Diagnosis Date   Anemia    Anxiety    Hypertension     Past Surgical History:  Procedure Laterality Date   TOOTH EXTRACTION      Family History  Problem Relation Age of Onset   Alcohol abuse Mother    Arthritis Mother    Depression Mother    Drug abuse Mother    Heart attack Mother    Hypertension Mother    Hyperlipidemia Mother    Alcohol abuse Father    Diabetes Father    Drug abuse Father    Hypertension Father    Heart attack Father    Stroke Father    Hypertension Sister    Arthritis Maternal Grandmother    Arthritis Maternal Grandfather    Diabetes Paternal Grandmother     Social History   Tobacco Use   Smoking status: Never   Smokeless tobacco: Never  Vaping Use   Vaping Use: Never used  Substance Use Topics   Alcohol use: Not Currently   Drug use: No    Allergies: No Known Allergies  Medications Prior to Admission  Medication Sig Dispense Refill Last Dose   aspirin EC 81 MG tablet Take 1 tablet (81 mg total) by mouth daily. Swallow whole. 30 tablet 6    Blood Pressure Monitoring (BLOOD PRESSURE CUFF) MISC Use as directed for checking bp daily. (Patient taking differently: 1 each by Other route  as directed. Use as directed for checking bp daily.) 1 each 0    cyclobenzaprine (FLEXERIL) 10 MG tablet Take 1 tablet (10 mg total) by mouth 3 (three) times daily as needed for muscle spasms. 30 tablet 2    Ferrous Sulfate 220 (44 Fe) MG/5ML SOLN Take 5 mLs by mouth every other day. 200 mL 2    NIFEdipine (PROCARDIA XL) 90 MG 24 hr tablet Take 1 tablet (90 mg total) by mouth daily. 90 tablet 3    Prenatal MV-Min-Fe Fum-FA-DHA (PRENATAL 1 PO) Take 1 tablet by mouth daily. Gummies/Unknown strenght      propranolol (INDERAL) 80 MG tablet Take 1 tablet (80 mg total) by mouth 2 (two) times daily. 180 tablet 3     Review of Systems Physical Exam   Blood pressure (!) 145/95, pulse 97, temperature 97.9 F (36.6 C), resp. rate 18, height 5\' 5"  (1.651 m), last menstrual period 04/13/2020.  Physical Exam Vitals reviewed.  Constitutional:  Appearance: Normal appearance.  Cardiovascular:     Rate and Rhythm: Normal rate.  Pulmonary:     Effort: Pulmonary effort is normal.     Breath sounds: Normal breath sounds.  Abdominal:     General: Abdomen is flat.     Palpations: Abdomen is soft.  Skin:    General: Skin is warm and dry.     Capillary Refill: Capillary refill takes less than 2 seconds.  Neurological:     General: No focal deficit present.     Mental Status: She is alert.  Psychiatric:        Mood and Affect: Mood normal.        Behavior: Behavior normal.        Thought Content: Thought content normal.        Judgment: Judgment normal.    MAU Course  Procedures Prolonged NST done.  NST:  Baseline: 140  Variability: moderate Accelerations: 3 10x10s  Decelerations: none Contractions: none  MDM Patient discussed with Dr Annamaria Boots of MFM, who also reviewed the tracing. He already has the patient scheduled for NST in their office on Wednesday.  Patient has felt the baby move quite a bit since arrival.   Assessment and Plan   1. [redacted] weeks gestation of pregnancy   2.  Pre-existing essential hypertension during pregnancy in second trimester   3. Fetal growth restriction antepartum   4. Decreased fetal movements in third trimester, single or unspecified fetus    Discharge to home. Kick count instructions given. F/u in MFM office. Return precautions given.   Truett Mainland 11/09/2020, 2:37 PM

## 2020-11-09 NOTE — Progress Notes (Signed)
MFM Note  Mariea Mcmartin was seen for an ultrasound exam today due to maternal obesity with a BMI of 74 and chronic hypertension treated with Procardia and Inderal.  Her pregnancy has also been complicated by an IUGR fetus with absent end-diastolic flow noted on umbilical artery Doppler studies.  She has complained of decreased fetal movements over the past few days.  Her blood pressures in our office today were 152/102 and on repeat it was 150/87.  She denies any signs and symptoms of preeclampsia.   The patient received a course of antenatal corticosteroids last week when she was sent to the MAU.  She ruled out for preeclampsia at that time.  She had a nonreactive NST in our office today.  Minimal variability was noted.  There was normal amniotic fluid noted on today's ultrasound exam.  Doppler studies of the umbilical arteries performed due to fetal growth restriction continues to show absent end-diastolic flow.  There were no signs of reversed end-diastolic flow noted today.  The increased risk of a fetal demise due to the nonreactive NST, IUGR, and abnormal umbilical artery Doppler studies along with her elevated blood pressures was discussed.  I would recommend that the patient be admitted to the hospital for daily fetal testing.  Delivery would be indicated for nonreassuring fetal status.  However as the NICU at [facility] currently does not have any available beds and as the patient would prefer not to be transferred to another institution, she was sent to the MAU for prolonged monitoring.  In the MAU, the patient had a few 10 x 10 accelerations on the fetal heart rate tracing.  Blood work will be drawn to make sure that she does not have severe preeclampsia.  The patient will return to our office in 2 days for another NST.  We will continue to see her for 3 times a week fetal testing.  We will consider hospitalization or delivery once the NICU at [facility] has more availability.    Fetal kick count instructions were reviewed today.  A total of 20 minutes was spent counseling and coordinating the care for this patient.  Greater than 50% of the time was spent in direct face-to-face contact.

## 2020-11-11 ENCOUNTER — Ambulatory Visit: Payer: Managed Care, Other (non HMO) | Admitting: *Deleted

## 2020-11-11 ENCOUNTER — Other Ambulatory Visit: Payer: Self-pay

## 2020-11-11 ENCOUNTER — Ambulatory Visit: Payer: Managed Care, Other (non HMO) | Attending: Obstetrics | Admitting: *Deleted

## 2020-11-11 VITALS — BP 154/85 | HR 77

## 2020-11-11 DIAGNOSIS — Z3A30 30 weeks gestation of pregnancy: Secondary | ICD-10-CM | POA: Diagnosis not present

## 2020-11-11 DIAGNOSIS — O365930 Maternal care for other known or suspected poor fetal growth, third trimester, not applicable or unspecified: Secondary | ICD-10-CM | POA: Insufficient documentation

## 2020-11-11 DIAGNOSIS — O10013 Pre-existing essential hypertension complicating pregnancy, third trimester: Secondary | ICD-10-CM | POA: Insufficient documentation

## 2020-11-11 DIAGNOSIS — O99213 Obesity complicating pregnancy, third trimester: Secondary | ICD-10-CM | POA: Diagnosis not present

## 2020-11-11 NOTE — Procedures (Signed)
Shelby Chandler [birth year] [redacted]w[redacted]d  Fetus A Non-Stress Test Interpretation for 11/11/20  Indication: IUGR, Chronic Hypertenstion, and morbid obesity  Fetal Heart Rate A Mode: External Baseline Rate (A): 130 bpm Variability: Moderate Accelerations: 10 x 10 Decelerations: None Multiple birth?: No  Uterine Activity Mode: Palpation, Toco Contraction Frequency (min): None Resting Tone Palpated: Relaxed Resting Time: Adequate  Interpretation (Fetal Testing) Nonstress Test Interpretation: Reactive Overall Impression: Reassuring for gestational age Comments: Dr. Annamaria Boots reviewed tracing. Patient feeling FM well.

## 2020-11-13 ENCOUNTER — Ambulatory Visit: Payer: Managed Care, Other (non HMO) | Attending: Obstetrics | Admitting: Obstetrics

## 2020-11-13 ENCOUNTER — Other Ambulatory Visit: Payer: Self-pay | Admitting: Obstetrics and Gynecology

## 2020-11-13 ENCOUNTER — Encounter: Payer: Managed Care, Other (non HMO) | Admitting: Obstetrics & Gynecology

## 2020-11-13 ENCOUNTER — Ambulatory Visit (HOSPITAL_BASED_OUTPATIENT_CLINIC_OR_DEPARTMENT_OTHER): Payer: Managed Care, Other (non HMO)

## 2020-11-13 ENCOUNTER — Ambulatory Visit (HOSPITAL_BASED_OUTPATIENT_CLINIC_OR_DEPARTMENT_OTHER): Payer: Managed Care, Other (non HMO) | Admitting: *Deleted

## 2020-11-13 ENCOUNTER — Other Ambulatory Visit: Payer: Managed Care, Other (non HMO)

## 2020-11-13 ENCOUNTER — Ambulatory Visit: Payer: Managed Care, Other (non HMO) | Admitting: *Deleted

## 2020-11-13 ENCOUNTER — Encounter: Payer: Self-pay | Admitting: *Deleted

## 2020-11-13 ENCOUNTER — Other Ambulatory Visit: Payer: Self-pay

## 2020-11-13 VITALS — BP 148/79

## 2020-11-13 VITALS — BP 156/101 | HR 76

## 2020-11-13 DIAGNOSIS — I10 Essential (primary) hypertension: Secondary | ICD-10-CM | POA: Insufficient documentation

## 2020-11-13 DIAGNOSIS — Z3A30 30 weeks gestation of pregnancy: Secondary | ICD-10-CM

## 2020-11-13 DIAGNOSIS — O10913 Unspecified pre-existing hypertension complicating pregnancy, third trimester: Secondary | ICD-10-CM | POA: Diagnosis not present

## 2020-11-13 DIAGNOSIS — O99213 Obesity complicating pregnancy, third trimester: Secondary | ICD-10-CM | POA: Insufficient documentation

## 2020-11-13 DIAGNOSIS — E669 Obesity, unspecified: Secondary | ICD-10-CM | POA: Diagnosis not present

## 2020-11-13 DIAGNOSIS — O99013 Anemia complicating pregnancy, third trimester: Secondary | ICD-10-CM | POA: Diagnosis not present

## 2020-11-13 DIAGNOSIS — O365990 Maternal care for other known or suspected poor fetal growth, unspecified trimester, not applicable or unspecified: Secondary | ICD-10-CM | POA: Insufficient documentation

## 2020-11-13 DIAGNOSIS — O10919 Unspecified pre-existing hypertension complicating pregnancy, unspecified trimester: Secondary | ICD-10-CM | POA: Insufficient documentation

## 2020-11-13 DIAGNOSIS — O365931 Maternal care for other known or suspected poor fetal growth, third trimester, fetus 1: Secondary | ICD-10-CM | POA: Insufficient documentation

## 2020-11-13 DIAGNOSIS — O365930 Maternal care for other known or suspected poor fetal growth, third trimester, not applicable or unspecified: Secondary | ICD-10-CM | POA: Insufficient documentation

## 2020-11-13 NOTE — Progress Notes (Signed)
MFM Note  Shelby Chandler was seen for a follow-up ultrasound due to severe IUGR with absent end-diastolic flow, maternal obesity with a BMI of 70, and chronic hypertension treated with Inderal and nifedipine.  Her blood pressure today was 156/101 and 145/79.  She denies any signs or symptoms of severe preeclampsia.  She already received a complete course of antenatal corticosteroids 2 weeks ago.  On today's exam, the overall EFW continues to measure at less than the 1st percentile for her gestational age (EFW 2 pounds 6 ounces).  The fetus grew about half a pound over the last 3 weeks.  Low normal amniotic fluid with a total AFI of 7.4 cm is noted.  Doppler studies of the umbilical arteries performed today shows continued absent end-diastolic flow.  There were no signs of reversed end-diastolic flow noted.  She had a reactive NST for her gestational age today.  10 x 10 accelerations were noted on her NST.   The patient was advised regarding my recommendation for inpatient management with daily fetal testing until delivery.  However, she declined admission to the hospital today.  As she would prefer outpatient management, we will continue to see her for 3 times a week fetal testing.    Due to severe IUGR with absent end-diastolic flow, delivery is recommended at around 33 weeks.  She should receive a rescue course of steroids prior to delivery.  Fetal kick count instructions were reviewed today.    She will return in 3 days for another ultrasound and NST.  A total of 20 minutes was spent counseling and coordinating the care for this patient.  Greater than 50% of the time was spent in direct face-to-face contact.

## 2020-11-13 NOTE — Procedures (Addendum)
Shelby Chandler [birth year] [redacted]w[redacted]d  Fetus A Non-Stress Test Interpretation for 11/13/20  Indication: IUGR and Chronic Hypertenstion, BMI 69  Fetal Heart Rate A Mode: External Baseline Rate (A): 130 bpm Variability: Moderate Accelerations: 10 x 10 Multiple birth?: No  Uterine Activity Mode: Palpation, Toco Contraction Frequency (min): None Resting Tone Palpated: Relaxed Resting Time: Adequate  Interpretation (Fetal Testing) Overall Impression: Reassuring for gestational age Comments: Dr. Annamaria Boots reviewed tracing.

## 2020-11-16 ENCOUNTER — Ambulatory Visit: Payer: Managed Care, Other (non HMO)

## 2020-11-16 ENCOUNTER — Inpatient Hospital Stay (HOSPITAL_COMMUNITY)
Admission: AD | Admit: 2020-11-16 | Discharge: 2020-11-25 | DRG: 787 | Disposition: A | Discharge status: Home / Self Care | Payer: Managed Care, Other (non HMO) | Attending: Family Medicine | Admitting: Family Medicine

## 2020-11-16 ENCOUNTER — Ambulatory Visit: Payer: Managed Care, Other (non HMO) | Admitting: *Deleted

## 2020-11-16 ENCOUNTER — Ambulatory Visit (HOSPITAL_BASED_OUTPATIENT_CLINIC_OR_DEPARTMENT_OTHER): Payer: Managed Care, Other (non HMO)

## 2020-11-16 ENCOUNTER — Other Ambulatory Visit: Payer: Self-pay

## 2020-11-16 ENCOUNTER — Encounter (HOSPITAL_COMMUNITY): Payer: Self-pay | Admitting: Obstetrics and Gynecology

## 2020-11-16 ENCOUNTER — Encounter: Payer: Self-pay | Admitting: *Deleted

## 2020-11-16 VITALS — BP 142/95 | HR 79

## 2020-11-16 DIAGNOSIS — I10 Essential (primary) hypertension: Secondary | ICD-10-CM | POA: Diagnosis present

## 2020-11-16 DIAGNOSIS — E669 Obesity, unspecified: Secondary | ICD-10-CM | POA: Diagnosis not present

## 2020-11-16 DIAGNOSIS — Z23 Encounter for immunization: Secondary | ICD-10-CM

## 2020-11-16 DIAGNOSIS — O99214 Obesity complicating childbirth: Secondary | ICD-10-CM | POA: Diagnosis present

## 2020-11-16 DIAGNOSIS — O10913 Unspecified pre-existing hypertension complicating pregnancy, third trimester: Secondary | ICD-10-CM | POA: Insufficient documentation

## 2020-11-16 DIAGNOSIS — O1002 Pre-existing essential hypertension complicating childbirth: Secondary | ICD-10-CM | POA: Diagnosis present

## 2020-11-16 DIAGNOSIS — Z3A31 31 weeks gestation of pregnancy: Secondary | ICD-10-CM

## 2020-11-16 DIAGNOSIS — O288 Other abnormal findings on antenatal screening of mother: Secondary | ICD-10-CM

## 2020-11-16 DIAGNOSIS — O114 Pre-existing hypertension with pre-eclampsia, complicating childbirth: Secondary | ICD-10-CM | POA: Diagnosis present

## 2020-11-16 DIAGNOSIS — Z20822 Contact with and (suspected) exposure to covid-19: Secondary | ICD-10-CM | POA: Diagnosis present

## 2020-11-16 DIAGNOSIS — O99013 Anemia complicating pregnancy, third trimester: Secondary | ICD-10-CM

## 2020-11-16 DIAGNOSIS — O10013 Pre-existing essential hypertension complicating pregnancy, third trimester: Secondary | ICD-10-CM

## 2020-11-16 DIAGNOSIS — O1092 Unspecified pre-existing hypertension complicating childbirth: Secondary | ICD-10-CM | POA: Diagnosis not present

## 2020-11-16 DIAGNOSIS — O99824 Streptococcus B carrier state complicating childbirth: Secondary | ICD-10-CM | POA: Diagnosis present

## 2020-11-16 DIAGNOSIS — O365930 Maternal care for other known or suspected poor fetal growth, third trimester, not applicable or unspecified: Secondary | ICD-10-CM

## 2020-11-16 DIAGNOSIS — O365990 Maternal care for other known or suspected poor fetal growth, unspecified trimester, not applicable or unspecified: Secondary | ICD-10-CM

## 2020-11-16 DIAGNOSIS — O4100X0 Oligohydramnios, unspecified trimester, not applicable or unspecified: Secondary | ICD-10-CM | POA: Diagnosis not present

## 2020-11-16 DIAGNOSIS — O99213 Obesity complicating pregnancy, third trimester: Secondary | ICD-10-CM

## 2020-11-16 DIAGNOSIS — O0990 Supervision of high risk pregnancy, unspecified, unspecified trimester: Secondary | ICD-10-CM

## 2020-11-16 DIAGNOSIS — O10919 Unspecified pre-existing hypertension complicating pregnancy, unspecified trimester: Secondary | ICD-10-CM

## 2020-11-16 DIAGNOSIS — O119 Pre-existing hypertension with pre-eclampsia, unspecified trimester: Secondary | ICD-10-CM

## 2020-11-16 DIAGNOSIS — O321XX0 Maternal care for breech presentation, not applicable or unspecified: Secondary | ICD-10-CM | POA: Diagnosis present

## 2020-11-16 DIAGNOSIS — O4103X0 Oligohydramnios, third trimester, not applicable or unspecified: Secondary | ICD-10-CM | POA: Diagnosis present

## 2020-11-16 DIAGNOSIS — Z6841 Body Mass Index (BMI) 40.0 and over, adult: Secondary | ICD-10-CM

## 2020-11-16 DIAGNOSIS — O1414 Severe pre-eclampsia complicating childbirth: Secondary | ICD-10-CM | POA: Diagnosis not present

## 2020-11-16 DIAGNOSIS — O162 Unspecified maternal hypertension, second trimester: Secondary | ICD-10-CM | POA: Diagnosis present

## 2020-11-16 LAB — COMPREHENSIVE METABOLIC PANEL
ALT: 20 U/L (ref 0–44)
AST: 16 U/L (ref 15–41)
Albumin: 2.9 g/dL — ABNORMAL LOW (ref 3.5–5.0)
Alkaline Phosphatase: 149 U/L — ABNORMAL HIGH (ref 38–126)
Anion gap: 9 (ref 5–15)
BUN: 12 mg/dL (ref 6–20)
CO2: 22 mmol/L (ref 22–32)
Calcium: 9 mg/dL (ref 8.9–10.3)
Chloride: 105 mmol/L (ref 98–111)
Creatinine, Ser: 0.82 mg/dL (ref 0.44–1.00)
GFR, Estimated: >60 mL/min (ref >60)
Glucose, Bld: 74 mg/dL (ref 70–99)
Potassium: 4.1 mmol/L (ref 3.5–5.1)
Sodium: 136 mmol/L (ref 135–145)
Total Bilirubin: 0.3 mg/dL (ref 0.3–1.2)
Total Protein: 7.3 g/dL (ref 6.5–8.1)

## 2020-11-16 LAB — CBC
HCT: 37.1 % (ref 36.0–46.0)
Hemoglobin: 11.6 g/dL — ABNORMAL LOW (ref 12.0–15.0)
MCH: 23.6 pg — ABNORMAL LOW (ref 26.0–34.0)
MCHC: 31.3 g/dL (ref 30.0–36.0)
MCV: 75.6 fL — ABNORMAL LOW (ref 80.0–100.0)
Platelets: 502 10*3/uL — ABNORMAL HIGH (ref 150–400)
RBC: 4.91 MIL/uL (ref 3.87–5.11)
RDW: 15.8 % — ABNORMAL HIGH (ref 11.5–15.5)
WBC: 9.7 10*3/uL (ref 4.0–10.5)
nRBC: 0 % (ref 0.0–0.2)

## 2020-11-16 LAB — TYPE AND SCREEN
ABO/RH(D): O POS
Antibody Screen: NEGATIVE

## 2020-11-16 LAB — RESP PANEL BY RT-PCR (FLU A&B, COVID) ARPGX2
Influenza A by PCR: NEGATIVE
Influenza B by PCR: NEGATIVE
SARS Coronavirus 2 by RT PCR: NEGATIVE

## 2020-11-16 LAB — OB RESULTS CONSOLE GBS: GBS: POSITIVE

## 2020-11-16 MED ORDER — DOCUSATE SODIUM 100 MG PO CAPS
100.0000 mg | ORAL_CAPSULE | Freq: Every day | ORAL | Status: DC
  Administered 2020-11-17 – 2020-11-21 (×5): 100 mg via ORAL
  Filled 2020-11-16 (×6): qty 1

## 2020-11-16 MED ORDER — PROPRANOLOL HCL 40 MG PO TABS
80.0000 mg | ORAL_TABLET | Freq: Two times a day (BID) | ORAL | Status: DC
  Administered 2020-11-16 – 2020-11-25 (×17): 80 mg via ORAL
  Filled 2020-11-16 (×5): qty 1
  Filled 2020-11-16: qty 2
  Filled 2020-11-16 (×13): qty 1
  Filled 2020-11-16: qty 2
  Filled 2020-11-16 (×3): qty 1

## 2020-11-16 MED ORDER — NIFEDIPINE ER OSMOTIC RELEASE 30 MG PO TB24
90.0000 mg | ORAL_TABLET | Freq: Every day | ORAL | Status: DC
  Administered 2020-11-17 – 2020-11-25 (×9): 90 mg via ORAL
  Filled 2020-11-16 (×10): qty 1

## 2020-11-16 MED ORDER — SODIUM CHLORIDE 0.9 % IV SOLN: 250.0000 mL | INTRAVENOUS | Status: DC | PRN

## 2020-11-16 MED ORDER — PROPRANOLOL HCL 80 MG PO TABS
80.0000 mg | ORAL_TABLET | Freq: Two times a day (BID) | ORAL | Status: DC
  Filled 2020-11-16 (×2): qty 1

## 2020-11-16 MED ORDER — ASPIRIN 81 MG PO CHEW
81.0000 mg | CHEWABLE_TABLET | Freq: Every day | ORAL | Status: DC
  Administered 2020-11-17 – 2020-11-21 (×5): 81 mg via ORAL
  Filled 2020-11-16 (×5): qty 1

## 2020-11-16 MED ORDER — CALCIUM CARBONATE ANTACID 500 MG PO CHEW: 2.0000 | CHEWABLE_TABLET | ORAL | Status: DC | PRN

## 2020-11-16 MED ORDER — PRENATAL MULTIVITAMIN CH
1.0000 | ORAL_TABLET | Freq: Every day | ORAL | Status: DC
  Administered 2020-11-16 – 2020-11-21 (×6): 1 via ORAL
  Filled 2020-11-16 (×6): qty 1

## 2020-11-16 MED ORDER — ACETAMINOPHEN 325 MG PO TABS: 650.0000 mg | ORAL_TABLET | ORAL | Status: DC | PRN

## 2020-11-16 MED ORDER — SODIUM CHLORIDE 0.9% FLUSH: 3.0000 mL | INTRAVENOUS | Status: DC | PRN

## 2020-11-16 MED ORDER — SODIUM CHLORIDE 0.9% FLUSH
3.0000 mL | Freq: Two times a day (BID) | INTRAVENOUS | Status: DC
  Administered 2020-11-16 – 2020-11-20 (×10): 3 mL via INTRAVENOUS

## 2020-11-16 NOTE — H&P (Addendum)
Veryl Abril Owen is a 30 y.o. female G1P0 at 36 w presenting for inpatient observation until delivery. Patient with prenatal care at [facility] complicated by maternal obesity with BMI 70, chronic hypertension on procardia and inderal and severe intrauterine growth restriction. Patient reports feeling well without complaints. She reports good fetal movement. She denies cramping/contractions, she denies leakage of fluid or vaginal bleeding. Patient sent from MFM office secondary to absent end diastolic flow and oligohydramnios.  OB History     Gravida  1   Para  0   Term  0   Preterm      AB  0   Living         SAB  0   IAB  0   Ectopic  0   Multiple  0   Live Births             Past Medical History:  Diagnosis Date   Anemia    Anxiety    Hypertension    Past Surgical History:  Procedure Laterality Date   TOOTH EXTRACTION     Family History: family history includes Alcohol abuse in her father and mother; Arthritis in her maternal grandfather, maternal grandmother, and mother; Depression in her mother; Diabetes in her father and paternal grandmother; Drug abuse in her father and mother; Heart attack in her father and mother; Hyperlipidemia in her mother; Hypertension in her father, mother, and sister; Stroke in her father. Social History:  reports that she has never smoked. She has never used smokeless tobacco. She reports that she does not currently use alcohol. She reports that she does not use drugs.     Maternal Diabetes: No Genetic Screening: insufficient DNA for NIPS Maternal Ultrasounds/Referrals: IUGR Fetal Ultrasounds or other Referrals:  None Maternal Substance Abuse:  No Significant Maternal Medications:  Procardia and Inderal Significant Maternal Lab Results:   Other Comments:  None  Review of Systems See pertinent in HPI History   Last menstrual period 04/13/2020. Exam Physical Exam  GENERAL: Well-developed, well-nourished female in no  acute distress.  LUNGS: Clear to auscultation bilaterally.  HEART: Regular rate and rhythm. ABDOMEN: Soft, nontender, gravid, obese PELVIC: Not indicated EXTREMITIES: No cyanosis, clubbing, or edema, 2+ distal pulses.  Prenatal labs: ABO, Rh: --/Positive/-- (09/30 0000) Antibody: NEG (09/28 1127) Rubella: Immune (05/06 0000) RPR: NON REACTIVE (09/28 1125)  HBsAg: Negative (04/27 0000)  HIV: Non Reactive (09/16 1141)  GBS:     Assessment/Plan: 30 yo P0 at [redacted]w[redacted]d with severe fetal growth restriction, oligohydramnios and CHTN  1) CHTN - Continue procardia and Inderal  2) IUGR - Neonatal consultation - Patient completed betamethasone series on 9/28 & 9/29 - Continue close fetal monitoring - Twice weekly doppler and BPP - Delivery at 33 weeks or for fetal/maternal indications - Discussed possible delivery by cesarean section pending fetal status   3) DVT prophylaxis with ambulation and SCD when in bed  Harika Laidlaw 11/16/2020, 12:49 PM

## 2020-11-16 NOTE — Plan of Care (Signed)
  Problem: Education: Goal: Knowledge of General Education information will improve Description: Including pain rating scale, medication(s)/side effects and non-pharmacologic comfort measures Outcome: Completed/Met

## 2020-11-17 LAB — GC/CHLAMYDIA PROBE AMP (~~LOC~~) NOT AT ARMC
Chlamydia: NEGATIVE
Comment: NEGATIVE
Comment: NORMAL
Neisseria Gonorrhea: NEGATIVE

## 2020-11-17 NOTE — Progress Notes (Signed)
Patient ID: Shelby Chandler, female   DOB: 1990/06/20, 30 y.o.   MRN: [id number] [place]) NOTE  Tharon Kitch Jasinski is a 30 y.o. G1P0000 at [redacted]w[redacted]d  who is admitted for inpatient observation due to severe IUGR with CHTN.    Fetal presentation is cephalic. Length of Stay:  1  Days  Date of admission:11/16/2020  Subjective: Patient reports feeling well. She denies pelvic pain/contractions, leakage of fluid or vaginal bleeding. She reports good fetal movement Patient reports the fetal movement as active. Patient reports uterine contraction  activity as none. Patient reports  vaginal bleeding as none. Patient describes fluid per vagina as None.  Vitals:  Blood pressure (!) 151/88, pulse 78, temperature 98.1 F (36.7 C), temperature source Oral, resp. rate 18, height 5\' 5"  (1.651 m), weight (!) 199.6 kg, last menstrual period 04/13/2020, SpO2 100 %. Vitals:   11/16/20 1618 11/16/20 2122 11/17/20 0047 11/17/20 0738  BP: (!) 146/85 (!) 141/84 139/77 (!) 151/88  Pulse: 80 81 84 78  Resp: 18 18 17 18   Temp: 98 F (36.7 C) 98.2 F (36.8 C) 98.1 F (36.7 C) 98.1 F (36.7 C)  TempSrc: Oral Oral Oral Oral  SpO2: 100% 100% 100% 100%  Weight:      Height:       Physical Examination:  GENERAL: Well-developed, well-nourished female in no acute distress.  LUNGS: Clear to auscultation bilaterally.  HEART: Regular rate and rhythm. ABDOMEN: Soft, nontender, gravid, obese PELVIC: Not indicated EXTREMITIES: No cyanosis, clubbing, or edema, 2+ distal pulses.   Fetal Monitoring:  baseline 135, min-mod variability, +10 x 10 accels, occasional variable decels    Labs:  Results for orders placed or performed during the hospital encounter of 11/16/20 (from the past 24 hour(s))  Culture, beta strep (group b only)   Collection Time: 11/16/20  2:05 PM   Specimen: Vaginal/Rectal; Genital  Result Value Ref Range   Specimen Description VAGINAL/RECTAL    Special  Requests NONE    Culture      CULTURE REINCUBATED FOR BETTER GROWTH Performed at [facility] [address], [place], [place] [address]    Report Status PENDING   Resp Panel by RT-PCR (Flu A&B, Covid) Nasopharyngeal Swab   Collection Time: 11/16/20  2:05 PM   Specimen: Nasopharyngeal Swab; Nasopharyngeal(NP) swabs in vial transport medium  Result Value Ref Range   SARS Coronavirus 2 by RT PCR NEGATIVE NEGATIVE   Influenza A by PCR NEGATIVE NEGATIVE   Influenza B by PCR NEGATIVE NEGATIVE  Type and screen [place]   Collection Time: 11/16/20  2:10 PM  Result Value Ref Range   ABO/RH(D) O POS    Antibody Screen NEG    Sample Expiration      11/19/2020,2359 Performed at [facility], [place] [address], [place], [place] [address]   Comprehensive metabolic panel   Collection Time: 11/16/20  2:14 PM  Result Value Ref Range   Sodium 136 135 - 145 mmol/L   Potassium 4.1 3.5 - 5.1 mmol/L   Chloride 105 98 - 111 mmol/L   CO2 22 22 - 32 mmol/L   Glucose, Bld 74 70 - 99 mg/dL   BUN 12 6 - 20 mg/dL   Creatinine, Ser 0.82 0.44 - 1.00 mg/dL   Calcium 9.0 8.9 - 10.3 mg/dL   Total Protein 7.3 6.5 - 8.1 g/dL   Albumin 2.9 (L) 3.5 - 5.0 g/dL   AST 16 15 - 41 U/L  ALT 20 0 - 44 U/L   Alkaline Phosphatase 149 (H) 38 - 126 U/L   Total Bilirubin 0.3 0.3 - 1.2 mg/dL   GFR, Estimated >60 >60 mL/min   Anion gap 9 5 - 15  CBC on admission   Collection Time: 11/16/20  2:14 PM  Result Value Ref Range   WBC 9.7 4.0 - 10.5 K/uL   RBC 4.91 3.87 - 5.11 MIL/uL   Hemoglobin 11.6 (L) 12.0 - 15.0 g/dL   HCT 37.1 36.0 - 46.0 %   MCV 75.6 (L) 80.0 - 100.0 fL   MCH 23.6 (L) 26.0 - 34.0 pg   MCHC 31.3 30.0 - 36.0 g/dL   RDW 15.8 (H) 11.5 - 15.5 %   Platelets 502 (H) 150 - 400 K/uL   nRBC 0.0 0.0 - 0.2 %    Imaging Studies:    [place] MFM OB LIMITED  Result Date: 11/16/2020 ----------------------------------------------------------------------  OBSTETRICS REPORT                        (Signed Final 11/16/2020 01:00 pm) ---------------------------------------------------------------------- Patient Info  ID #:       [id number]                          D.O.B.:  1991-01-08 (30 yrs)  [name]             Visit Date: 11/16/2020 08:49 am ---------------------------------------------------------------------- Performed By  Attending:        Tama High MD        Ref. Address:     [address]                                                             [place]                                                             [place]  Performed By:     Rodrigo Ran BS      [facility]                    RDMS[facility]RVT  [facility] at                                                             [place] for                                                             Women  Referred By:      [place] ---------------------------------------------------------------------- Orders  #  Description                           Code        Ordered By  1  [place] MFM UA CORD DOPPLER                76820.02    [place]  2  [place] MFM OB LIMITED                     76815.01    RAVI [facility] ----------------------------------------------------------------------  #  Order #                     Accession #                Episode #  1  [id number]                   [id number]                 [id number]  2  [id number]                   [id number]                 [id number] ---------------------------------------------------------------------- Indications  [redacted] weeks gestation of pregnancy                Z3A.1  Maternal care for known or suspected poor      O36.5930  fetal growth, third trimester, not applicable or  unspecified IUGR  Hypertension - Chronic/Pre-existing            O10.019  (nifedipine and propranolol)   Maternal morbid obesity (BMI 69)               O99.210 E66.01  Anemia during pregnancy in third trimester     O99.013  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)                   Z14.8  Insufficient FF on NIPS / Negative AFP ---------------------------------------------------------------------- Fetal Evaluation  Num Of Fetuses:         1  Fetal Heart Rate(bpm):  135  Cardiac Activity:       Observed  Presentation:           Cephalic  Placenta:               Fundal  Amniotic Fluid  AFI FV:      Oligohydramnios  AFI Sum(cm)     %Tile       Largest Pocket(cm)  5.2             <  3         1.7  RUQ(cm)       RLQ(cm)       LUQ(cm)        LLQ(cm)  1.7           1.4           0.9            1.2 ---------------------------------------------------------------------- OB History  Blood Type:   O-  Gravidity:    1 ---------------------------------------------------------------------- Gestational Age  LMP:           31w 0d        Date:  04/13/20                 EDD:   01/18/21  Best:          Burke Keels 0d     Det. By:  LMP  (04/13/20)          EDD:   01/18/21 ---------------------------------------------------------------------- Anatomy  Stomach:               Appears normal, left   Bladder:                Appears normal                         sided ---------------------------------------------------------------------- Doppler - Fetal Vessels  Umbilical Artery                                                              ADFV    RDFV                                                                Yes      No ---------------------------------------------------------------------- Cervix Uterus Adnexa  Cervix  Not visualized (advanced GA >24wks) ---------------------------------------------------------------------- Impression  Ms. Mattes with severe fetal growth restriction chronic  hypertension [facility] with twice weekly [facility].  She takes nifedipine and propranolol for blood pressure  control.   Blood pressure today at her office is 142/95 mmHg.  She had received the first course of antenatal corticosteroids  On today's ultrasound, oligohydramnios is seen (AFI 5 cm).  Good fetal activity is present.  Umbilical artery Doppler  showed persistent absent end-diastolic flow  I counseled the patient on ultrasound finding.  Severe fetal  growth restriction and oligohydramnios with abnormal  Doppler studies places the fetus at risk of increased perinatal  mortality and morbidity.  I recommend inpatient management with daily NSTs and  twice-weekly ultrasound.  I recommended delivery at [redacted] weeks gestation.  Patient  agreed with my recommendations.  Discussed with OB attending, Dr. Elly Modena.  Patient will be  going over to [facility] specialty care for admission. ---------------------------------------------------------------------- Recommendations  -[place] (Dr. Elly Modena).  -Daily NSTs.  -BPP and UA Doppler studies on Mon-Thu or more frequently  as needed.  -Neonatology consultation.  -Delivery at 33 weeks' gestation.  -Rescue course of steroids before delivery. ----------------------------------------------------------------------  Tama High, MD Electronically Signed Final Report   11/16/2020 01:00 pm ----------------------------------------------------------------------  [place] MFM UA CORD DOPPLER  Result Date: 11/16/2020 ----------------------------------------------------------------------  OBSTETRICS REPORT                       (Signed Final 11/16/2020 01:00 pm) ---------------------------------------------------------------------- Patient Info  ID #:       [id number]                          D.O.B.:  [birth year] (30 yrs)  Name:       PATRESE NEAL Levins             Visit Date: 11/16/2020 08:49 am ---------------------------------------------------------------------- Performed By  Attending:        Tama High MD        Ref. Address:     [place]                                                             [place]  Performed By:     Rodrigo Ran BS      [facility]         [facility]                    RDMS[facility]RVT                                 Fetal Care at                                                             [place] for  Women  Referred By:      [place] ---------------------------------------------------------------------- Orders  #  Description                           Code        Ordered By  1  [place] MFM UA CORD DOPPLER                76820.02    [facility]  2  [place] MFM OB LIMITED                     [id number]    RAVI [facility] ----------------------------------------------------------------------  #  Order #                     Accession #                Episode #  1  [id number]                   [id number]                 [id number]  2  [id number]                   [id number]                 [id number] ---------------------------------------------------------------------- Indications  [redacted] weeks gestation of pregnancy                Z3A.16  Maternal care for known or suspected poor      O36.5930  fetal growth, third trimester, not applicable or  unspecified IUGR  Hypertension - Chronic/Pre-existing            O10.019  (nifedipine and propranolol)  Maternal morbid obesity (BMI 69)               O99.210 E66.01  Anemia during pregnancy in third trimester     O99.013  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)                   Z14.8  Insufficient FF on NIPS / Negative AFP ---------------------------------------------------------------------- Fetal Evaluation  Num Of Fetuses:         1  Fetal Heart Rate(bpm):  135  Cardiac Activity:       Observed  Presentation:           Cephalic  Placenta:               Fundal  Amniotic Fluid  AFI FV:       Oligohydramnios  AFI Sum(cm)     %Tile       Largest Pocket(cm)  5.2             < 3         1.7  RUQ(cm)       RLQ(cm)       LUQ(cm)        LLQ(cm)  1.7           1.4           0.9            1.2 ---------------------------------------------------------------------- OB History  Blood Type:   O-  Gravidity:    1 ---------------------------------------------------------------------- Gestational Age  LMP:           31w 0d        Date:  04/13/20  EDD:   01/18/21  Best:          Burke Keels 0d     Det. By:  LMP  (04/13/20)          EDD:   01/18/21 ---------------------------------------------------------------------- Anatomy  Stomach:               Appears normal, left   Bladder:                Appears normal                         sided ---------------------------------------------------------------------- Doppler - Fetal Vessels  Umbilical Artery                                                              ADFV    RDFV                                                                Yes      No ---------------------------------------------------------------------- Cervix Uterus Adnexa  Cervix  Not visualized (advanced GA >24wks) ---------------------------------------------------------------------- Impression  Ms. Ketter with severe fetal growth restriction chronic  hypertension is [facility] with twice weekly ultrasound.  She takes nifedipine and propranolol for blood pressure  control.  Blood pressure today at her office is 142/95 mmHg.  She had received the first course of antenatal corticosteroids  On today's ultrasound, oligohydramnios is seen (AFI 5 cm).  Good fetal activity is present.  Umbilical artery Doppler  showed persistent absent end-diastolic flow  I counseled the patient on ultrasound finding.  Severe fetal  growth restriction and oligohydramnios with abnormal  Doppler studies places the fetus at risk of increased perinatal  mortality and morbidity.   I recommend inpatient management with daily NSTs and  twice-weekly ultrasound.  I recommended delivery at [redacted] weeks gestation.  Patient  agreed with my recommendations.  Discussed with OB attending, Dr. Elly Modena.  Patient will be  going over to [facility] specialty care for admission. ---------------------------------------------------------------------- Recommendations  -[place] (Dr. Elly Modena).  -Daily NSTs.  -BPP and UA Doppler studies on Mon-Thu or more frequently  as needed.  -Neonatology consultation.  -Delivery at 33 weeks' gestation.  -Rescue course of steroids before delivery. ----------------------------------------------------------------------                  Tama High, MD Electronically Signed Final Report   11/16/2020 01:00 pm ----------------------------------------------------------------------    Medications:  Scheduled  aspirin  81 mg Oral Daily   docusate sodium  100 mg Oral Daily   NIFEdipine  90 mg Oral Daily   prenatal multivitamin  1 tablet Oral Q1200   propranolol  80 mg Oral Q12H   sodium chloride flush  3 mL Intravenous Q12H   I have reviewed the patient's current medications.  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   Anxiety and depression 08/28/2017   Chronic low back pain without sciatica 08/28/2017   History of anemia 08/28/2017   Essential hypertension 01/09/2017   Iron deficiency anemia due to chronic blood loss 01/09/2017   Class 3 severe obesity due to  excess calories without serious comorbidity with body mass index (BMI) of 60.0 to 69.9 in adult ([place]) 01/09/2017    PLAN: 1) CHTN - BP stable - Continue Procardia and Propranolol - Continue ASA  2) IUGR - Fetal status currently stable - Follow up BPP and dopplers on 10/13  Delivery for worsening maternal/fetal status Continue current antepartum care     Hussein Macdougal 11/17/2020,9:24 AM

## 2020-11-17 NOTE — Consult Note (Signed)
   Consultation Service: Neonatology   Dr. Constant has asked for consultation on Shelby Chandler regarding the care of a premature infant at 31 weeks IUP. Thank you for inviting us to see this patient.   Reason for consult:  Explain the possible complications, the prognosis, and the care of a premature infant after 31-33 weeks.  Chief complaint: 30 y.o. female with an IUP with an estimated weight of 1075 grams. Pregnancy has been complicated by  chronic hypertension, HSV infection, BMI 70, and severe IUGR .  Plan is for delivery at 33 weeks if possible.  My key findings of this patient's HPI are:  I have reviewed the patient's chart and have met with her. The salient information is as follows:   Prenatal labs: ABO, Rh: --/Positive/-- (09/30 0000) Antibody: NEG (09/28 1127) Rubella: Immune (05/06 0000) RPR: NON REACTIVE (09/28 1125)  HBsAg: Negative (04/27 0000)  HIV: Non Reactive (09/16 1141)  GBS:  unknown   Prenatal care:   good Pregnancy complications:  chronic HTN, morbid obesity, severe IUGR Maternal antibiotics: This patient's mother is not on file. Maternal Steroids: Yes  Most recent dose:  9/28-9/29    My recommendations for this patient and my actions included:   1. In the presence of the Shelby Chandler  I spent 40 minutes discussing the possible complications and outcomes of prematurity at this gestational age. I discussed specific complications at this gestational age referencing the need for resuscitation at birth, respiratory support and surfactant administration for respiratory distress, IV fluids pending establishment of enteral feeds (encouraged breast milk feeding), antibiotics for possible sepsis, temperature support, and monitoring. I also discussed the potential risk of complications such as intracranial hemorrhage, retinopathy,  and chronic lung disease. I discussed this with mother in detail and they expressed an understanding of the risks and  complications of prematurity.   2. I also discussed the expected survival of an infant born at 33 weeks, which is good but complicated by the severe IUGR. We further discussed that most of the neonates born at this age have few neurological complications, but that the risks increase with decreasing gestational age and that growth restriction complicates these outcomes and increases the risk of mild to moderate neurological complications. She expressed an understanding of this information.   3. I informed her that the NICU team would be present at the delivery.    4. A visit to the NICU by the infant's mother and/or a significant other was encouraged. Visitation policy was discussed.   Final Impression:  30 y.o. female with a 31 week singleton IUP with recommended delivery at 33 weeks, who now understands the possible complications and prognosis of her infant. Her questions were answered. She is planning to try and provide breast milk for her infant.    ______________________________________________________________________  Thank you for asking us to participate in the care of this patient. Please do not hesitate to contact us again if you are aware of any further ways we can beof assistance.   Sincerely,  Elizabeth Turney, MD Attending Neonatologist   I spent ~40 minutes in consultation time, of which 25 minutes was spent in direct face to face counseling.   

## 2020-11-18 ENCOUNTER — Ambulatory Visit: Payer: Managed Care, Other (non HMO)

## 2020-11-18 ENCOUNTER — Other Ambulatory Visit: Payer: Managed Care, Other (non HMO)

## 2020-11-18 DIAGNOSIS — O365990 Maternal care for other known or suspected poor fetal growth, unspecified trimester, not applicable or unspecified: Secondary | ICD-10-CM | POA: Diagnosis present

## 2020-11-18 LAB — CULTURE, BETA STREP (GROUP B ONLY)

## 2020-11-18 MED ORDER — WITCH HAZEL-GLYCERIN EX PADS: MEDICATED_PAD | CUTANEOUS | Status: DC | PRN

## 2020-11-18 MED ORDER — HYDROCORTISONE (PERIANAL) 2.5 % EX CREA
TOPICAL_CREAM | Freq: Two times a day (BID) | CUTANEOUS | Status: DC
  Filled 2020-11-18: qty 28.35

## 2020-11-18 NOTE — H&P (Signed)
[facility] ANTEPARTUM COMPREHENSIVE PROGRESS NOTE Shelby Chandler is a 30 y.o. female G1P0 at [redacted]w[redacted]d presenting for inpatient observation until delivery. Patient with prenatal care at [facility] [place] complicated by maternal obesity with BMI 70, chronic hypertension on procardia and inderal and severe intrauterine growth restriction. Patient sent from MFM office secondary to absent end diastolic flow and oligohydramnios.  Length of Stay:  2 Days. Admitted 11/16/2020  Subjective: Patient reports feeling well without complaints. She reports good fetal movement. She denies cramping/contractions, she denies leakage of fluid or vaginal bleeding.   Vitals:  Blood pressure (!) 142/93, pulse 80, temperature 98.2 F (36.8 C), temperature source Oral, resp. rate 18, height 5\' 5"  (1.651 m), weight (!) 199.6 kg, last menstrual period 04/13/2020, SpO2 100 %. Physical Examination: CONSTITUTIONAL: Well-developed, well-nourished female in no acute distress.  NEUROLOGIC: Alert and oriented to person, place, and time. No cranial nerve deficit noted. PSYCHIATRIC: Normal mood and affect. Normal behavior. Normal judgment and thought content. CARDIOVASCULAR: Normal heart rate noted, regular rhythm RESPIRATORY: Effort and breath sounds normal, no problems with respiration noted MUSCULOSKELETAL: Normal range of motion. No edema and no tenderness. 2+ distal pulses. ABDOMEN: Soft, nontender, nondistended, gravid. CERVIX:    Fetal monitoring: not yet done  Results for orders placed or performed during the hospital encounter of 11/16/20 (from the past 48 hour(s))  GC/Chlamydia probe amp ([place])not at [facility]     Status: None   Collection Time: 11/16/20  1:02 PM  Result Value Ref Range   Neisseria Gonorrhea Negative    Chlamydia Negative    Comment Normal Reference Ranger Chlamydia - Negative    Comment      Normal Reference Range Neisseria Gonorrhea - Negative  Culture, beta strep (group b only)      Status: Abnormal   Collection Time: 11/16/20  2:05 PM   Specimen: Vaginal/Rectal; Genital  Result Value Ref Range   Specimen Description VAGINAL/RECTAL    Special Requests NONE    Culture (A)     GROUP B STREP(S.AGALACTIAE)ISOLATED TESTING AGAINST S. AGALACTIAE NOT ROUTINELY PERFORMED DUE TO PREDICTABILITY OF AMP/PEN/VAN SUSCEPTIBILITY. Performed at [facility], [place] [address] [place], [place] [address]    Report Status 11/18/2020 FINAL   Resp Panel by RT-PCR (Flu A&B, Covid) Nasopharyngeal Swab     Status: None   Collection Time: 11/16/20  2:05 PM   Specimen: Nasopharyngeal Swab; Nasopharyngeal(NP) swabs in vial transport medium  Result Value Ref Range   SARS Coronavirus 2 by RT PCR NEGATIVE NEGATIVE    Comment: (NOTE) SARS-CoV-2 target nucleic acids are NOT DETECTED.  The SARS-CoV-2 RNA is generally detectable in upper respiratory specimens during the acute phase of infection. The lowest concentration of SARS-CoV-2 viral copies this assay can detect is 138 copies/mL. A negative result does not preclude SARS-Cov-2 infection and should not be used as the sole basis for treatment or other patient management decisions. A negative result may occur with  improper specimen collection/handling, submission of specimen other than nasopharyngeal swab, presence of viral mutation(s) within the areas targeted by this assay, and inadequate number of viral copies(<138 copies/mL). A negative result must be combined with clinical observations, patient history, and epidemiological information. The expected result is Negative.  Fact Sheet for Patients:  EntrepreneurPulse.com.au  Fact Sheet for Healthcare Providers:  IncredibleEmployment.be  This test is no t yet approved or cleared by the [place] FDA and  has been authorized for detection and/or diagnosis of SARS-CoV-2 by FDA under an Emergency  Use Authorization (EUA). This EUA will remain  in  effect (meaning this test can be used) for the duration of the COVID-19 declaration under Section 564(b)(1) of the Act, 21 U.S.C.section 360bbb-3(b)(1), unless the authorization is terminated  or revoked sooner.       Influenza A by PCR NEGATIVE NEGATIVE   Influenza B by PCR NEGATIVE NEGATIVE    Comment: (NOTE) The Xpert Xpress SARS-CoV-2/FLU/RSV plus assay is intended as an aid in the diagnosis of influenza from Nasopharyngeal swab specimens and should not be used as a sole basis for treatment. Nasal washings and aspirates are unacceptable for Xpert Xpress SARS-CoV-2/FLU/RSV testing.  Fact Sheet for Patients: EntrepreneurPulse.com.au  Fact Sheet for Healthcare Providers: IncredibleEmployment.be  This test is not yet approved or cleared by the [place] FDA and has been authorized for detection and/or diagnosis of SARS-CoV-2 by FDA under an Emergency Use Authorization (EUA). This EUA will remain in effect (meaning this test can be used) for the duration of the COVID-19 declaration under Section 564(b)(1) of the Act, 21 U.S.C. section 360bbb-3(b)(1), unless the authorization is terminated or revoked.  Performed at [facility], [place] [address] [place], [place] [address]   Type and screen [place]     Status: None   Collection Time: 11/16/20  2:10 PM  Result Value Ref Range   ABO/RH(D) O POS    Antibody Screen NEG    Sample Expiration      11/19/2020,2359 Performed at [facility], [place] [address] [place], [place] [address]   Comprehensive metabolic panel     Status: Abnormal   Collection Time: 11/16/20  2:14 PM  Result Value Ref Range   Sodium 136 135 - 145 mmol/L   Potassium 4.1 3.5 - 5.1 mmol/L   Chloride 105 98 - 111 mmol/L   CO2 22 22 - 32 mmol/L   Glucose, Bld 74 70 - 99 mg/dL    Comment: Glucose reference range applies only to samples taken after fasting for at least 8 hours.   BUN 12 6 - 20  mg/dL   Creatinine, Ser 0.82 0.44 - 1.00 mg/dL   Calcium 9.0 8.9 - 10.3 mg/dL   Total Protein 7.3 6.5 - 8.1 g/dL   Albumin 2.9 (L) 3.5 - 5.0 g/dL   AST 16 15 - 41 U/L   ALT 20 0 - 44 U/L   Alkaline Phosphatase 149 (H) 38 - 126 U/L   Total Bilirubin 0.3 0.3 - 1.2 mg/dL   GFR, Estimated >60 >60 mL/min    Comment: (NOTE) Calculated using the CKD-EPI Creatinine Equation (2021)    Anion gap 9 5 - 15    Comment: Performed at [facility] [address] [place], [place] [address]  CBC on admission     Status: Abnormal   Collection Time: 11/16/20  2:14 PM  Result Value Ref Range   WBC 9.7 4.0 - 10.5 K/uL   RBC 4.91 3.87 - 5.11 MIL/uL   Hemoglobin 11.6 (L) 12.0 - 15.0 g/dL   HCT 37.1 36.0 - 46.0 %   MCV 75.6 (L) 80.0 - 100.0 fL   MCH 23.6 (L) 26.0 - 34.0 pg   MCHC 31.3 30.0 - 36.0 g/dL   RDW 15.8 (H) 11.5 - 15.5 %   Platelets 502 (H) 150 - 400 K/uL   nRBC 0.0 0.0 - 0.2 %    Comment: Performed at [facility] [address], [place], [place] [address]  [place] MFM OB LIMITED  Result Date: 11/16/2020 ----------------------------------------------------------------------  OBSTETRICS REPORT                       (Signed Final 11/16/2020 01:00 pm) ---------------------------------------------------------------------- Patient Info  ID #:       [id number]                          D.O.B.:  1990/10/03 (30 yrs)  Name:       GORDON CARLSON Biancardi             Visit Date: 11/16/2020 08:49 am ---------------------------------------------------------------------- Performed By  Attending:        Tama High MD        Ref. Address:     [place]                                                             [place]  Performed By:     Rodrigo Ran BS      [facility]                                 [facility] at                                                             [place] for                                                             Women  Referred By:  [place] ---------------------------------------------------------------------- Orders  #  Description                           Code        Ordered By  1  [place] MFM UA CORD DOPPLER                [id number]    RAVI SHANKAR  2  [place] MFM OB LIMITED                     [id number]    RAVI [facility] ----------------------------------------------------------------------  #  Order #                     Accession #                Episode #  1  [id number]                   [id number]                 [id number]  2  [id number]                   [id number]                 [id number] ---------------------------------------------------------------------- Indications  [redacted] weeks gestation of pregnancy                Z3A.74  Maternal care for known or suspected poor      O36.5930  fetal growth, third trimester, not applicable or  unspecified IUGR  Hypertension - Chronic/Pre-existing            O10.019  (nifedipine and propranolol)  Maternal morbid obesity (BMI 69)               O99.210 E66.01  Anemia during pregnancy in third trimester     O99.013  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)                   Z14.8  Insufficient FF on NIPS / Negative AFP ---------------------------------------------------------------------- Fetal Evaluation  Num Of Fetuses:         1  Fetal Heart Rate(bpm):  135  Cardiac Activity:       Observed  Presentation:           Cephalic  Placenta:               Fundal  Amniotic Fluid  AFI FV:      Oligohydramnios  AFI Sum(cm)     %Tile       Largest Pocket(cm)  5.2             < 3         1.7  RUQ(cm)       RLQ(cm)       LUQ(cm)        LLQ(cm)  1.7           1.4           0.9            1.2 ---------------------------------------------------------------------- OB History  Blood Type:   O-  Gravidity:    1  ---------------------------------------------------------------------- Gestational Age  LMP:           31w 0d        Date:  04/13/20  EDD:   01/18/21  Best:          Burke Keels 0d     Det. By:  LMP  (04/13/20)          EDD:   01/18/21 ---------------------------------------------------------------------- Anatomy  Stomach:               Appears normal, left   Bladder:                Appears normal                         sided ---------------------------------------------------------------------- Doppler - Fetal Vessels  Umbilical Artery                                                              ADFV    RDFV                                                                Yes      No ---------------------------------------------------------------------- Cervix Uterus Adnexa  Cervix  Not visualized (advanced GA >24wks) ---------------------------------------------------------------------- Impression  Shelby Chandler with severe fetal growth restriction chronic  hypertension [facility] with twice weekly ultrasound.  She takes nifedipine and propranolol for blood pressure  control.  Blood pressure today at her office is 142/95 mmHg.  She had received the first course of antenatal corticosteroids  On today's ultrasound, oligohydramnios is seen (AFI 5 cm).  Good fetal activity is present.  Umbilical artery Doppler  showed persistent absent end-diastolic flow  I counseled the patient on ultrasound finding.  Severe fetal  growth restriction and oligohydramnios with abnormal  Doppler studies places the fetus at risk of increased perinatal  mortality and morbidity.  I recommend inpatient management with daily NSTs and  twice-weekly ultrasound.  I recommended delivery at [redacted] weeks gestation.  Patient  agreed with my recommendations.  Discussed with OB attending, Dr. Elly Modena.  Patient will be  going over to [facility] specialty care for admission.  ---------------------------------------------------------------------- Recommendations  -[place] (Dr. Elly Modena).  -Daily NSTs.  -BPP and UA Doppler studies on Mon-Thu or more frequently  as needed.  -Neonatology consultation.  -Delivery at 33 weeks' gestation.  -Rescue course of steroids before delivery. ----------------------------------------------------------------------                  Tama High, MD Electronically Signed Final Report   11/16/2020 01:00 pm ----------------------------------------------------------------------  [place] MFM UA CORD DOPPLER  Result Date: 11/16/2020 ----------------------------------------------------------------------  OBSTETRICS REPORT                       (Signed Final 11/16/2020 01:00 pm) ---------------------------------------------------------------------- Patient Info  ID #:       [id number]                          D.O.B.:  10/27/1990 (30 yrs)  Name:       Kerby CHENE Budzinski  Visit Date: 11/16/2020 08:49 am ---------------------------------------------------------------------- Performed By  Attending:        Tama High MD        Ref. Address:     [place]                                                             [place]  Performed By:     Rodrigo Ran BS      [facility]         [facility]                    RD[facility]                                 Fetal Care at                                                             [place] for                                                             Women  Referred By:      [facility] ---------------------------------------------------------------------- Orders  #  Description                           Code        Ordered By  1  [place] MFM UA CORD DOPPLER                [id number]     [place]  2  [place] MFM OB LIMITED                     [id number]  Tama High ----------------------------------------------------------------------  #  Order #                     Accession #                Episode #  1  [id number]                   [id number]                 [id number]  2  [id number]                   [id number]                 [id number] ---------------------------------------------------------------------- Indications  [redacted] weeks gestation of pregnancy                Z3A.16  Maternal care for known or suspected poor      O36.5930  fetal growth, third trimester, not applicable or  unspecified IUGR  Hypertension - Chronic/Pre-existing            O10.019  (nifedipine and propranolol)  Maternal morbid obesity (BMI 69)               O99.210 E66.01  Anemia during pregnancy in third trimester     O99.013  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)                   Z14.8  Insufficient FF on NIPS / Negative AFP ---------------------------------------------------------------------- Fetal Evaluation  Num Of Fetuses:         1  Fetal Heart Rate(bpm):  135  Cardiac Activity:       Observed  Presentation:           Cephalic  Placenta:               Fundal  Amniotic Fluid  AFI FV:      Oligohydramnios  AFI Sum(cm)     %Tile       Largest Pocket(cm)  5.2             < 3         1.7  RUQ(cm)       RLQ(cm)       LUQ(cm)        LLQ(cm)  1.7           1.4           0.9            1.2 ---------------------------------------------------------------------- OB History  Blood Type:   O-  Gravidity:    1 ---------------------------------------------------------------------- Gestational Age  LMP:           31w 0d        Date:  04/13/20                 EDD:   01/18/21  Best:          31w 0d     Det. By:  LMP  (04/13/20)          EDD:   01/18/21 ---------------------------------------------------------------------- Anatomy  Stomach:               Appears normal, left   Bladder:                Appears normal  sided  ---------------------------------------------------------------------- Doppler - Fetal Vessels  Umbilical Artery                                                              ADFV    RDFV                                                                Yes      No ---------------------------------------------------------------------- Cervix Uterus Adnexa  Cervix  Not visualized (advanced GA >24wks) ---------------------------------------------------------------------- Impression  Shelby Chandler with severe fetal growth restriction chronic  hypertension is [facility] with twice weekly ultrasound.  She takes nifedipine and propranolol for blood pressure  control.  Blood pressure today at her office is 142/95 mmHg.  She had received the first course of antenatal corticosteroids  On today's ultrasound, oligohydramnios is seen (AFI 5 cm).  Good fetal activity is present.  Umbilical artery Doppler  showed persistent absent end-diastolic flow  I counseled the patient on ultrasound finding.  Severe fetal  growth restriction and oligohydramnios with abnormal  Doppler studies places the fetus at risk of increased perinatal  mortality and morbidity.  I recommend inpatient management with daily NSTs and  twice-weekly ultrasound.  I recommended delivery at [redacted] weeks gestation.  Patient  agreed with my recommendations.  Discussed with OB attending, Dr. Elly Modena.  Patient will be  going over to [facility] specialty care for admission. ---------------------------------------------------------------------- Recommendations  -[place] (Dr. Elly Modena).  -Daily NSTs.  -BPP and UA Doppler studies on Mon-Thu or more frequently  as needed.  -Neonatology consultation.  -Delivery at 33 weeks' gestation.  -Rescue course of steroids before delivery. ----------------------------------------------------------------------                  Tama High, MD Electronically Signed Final Report   11/16/2020  01:00 pm ----------------------------------------------------------------------     I have reviewed the patient's current medications.  Assessment/Plan: 30 yo P0 at [redacted]w[redacted]d with severe fetal growth restriction, oligohydramnios and CHTN  1) CHTN - Continue procardia and Inderal  2) IUGR - Neonatal consultation completed - Patient completed betamethasone series on 9/28 & 9/29 - Continue close fetal monitoring with NST daily - Twice weekly doppler and BPP - Delivery at 33 weeks or for fetal/maternal indications - Discussed possible delivery by cesarean section pending fetal status   3) DVT prophylaxis with ambulation and SCD when in bed  Radene Gunning 11/18/2020, 8:00 AM

## 2020-11-19 ENCOUNTER — Inpatient Hospital Stay (HOSPITAL_BASED_OUTPATIENT_CLINIC_OR_DEPARTMENT_OTHER): Payer: Managed Care, Other (non HMO)

## 2020-11-19 DIAGNOSIS — O99213 Obesity complicating pregnancy, third trimester: Secondary | ICD-10-CM | POA: Diagnosis not present

## 2020-11-19 DIAGNOSIS — Z3A31 31 weeks gestation of pregnancy: Secondary | ICD-10-CM

## 2020-11-19 DIAGNOSIS — O365930 Maternal care for other known or suspected poor fetal growth, third trimester, not applicable or unspecified: Secondary | ICD-10-CM | POA: Diagnosis not present

## 2020-11-19 DIAGNOSIS — E669 Obesity, unspecified: Secondary | ICD-10-CM | POA: Diagnosis not present

## 2020-11-19 DIAGNOSIS — O10013 Pre-existing essential hypertension complicating pregnancy, third trimester: Secondary | ICD-10-CM

## 2020-11-19 DIAGNOSIS — O365990 Maternal care for other known or suspected poor fetal growth, unspecified trimester, not applicable or unspecified: Secondary | ICD-10-CM | POA: Diagnosis not present

## 2020-11-19 MED ORDER — LACTATED RINGERS IV BOLUS
500.0000 mL | Freq: Once | INTRAVENOUS | Status: AC
  Administered 2020-11-19: 500 mL via INTRAVENOUS

## 2020-11-19 MED ORDER — BETAMETHASONE SOD PHOS & ACET 6 (3-3) MG/ML IJ SUSP
12.0000 mg | INTRAMUSCULAR | Status: AC
  Administered 2020-11-19 – 2020-11-20 (×2): 12 mg via INTRAMUSCULAR
  Filled 2020-11-19: qty 5

## 2020-11-19 NOTE — Consult Note (Signed)
MFM Note  This patient has been hospitalized due to IUGR with absent end-diastolic flow and chronic hypertension.  A biophysical profile performed today was 6 out of 8.  She received a -2 for fetal breathing movements that did not meet criteria.  The amniotic fluid level remains low with a total AFI of 3.2 cm.  A greater than 2 cm vertical pocket of amniotic fluid is present.  Doppler studies of the umbilical arteries performed today continues to show absent end-diastolic flow.  There were no signs of reversed end-diastolic flow noted today.  The patient should have an NST performed today.  She should have another BPP repeated tomorrow.  Due to the decrease in amniotic fluid and the likelihood that she will require delivery soon, a rescue course of steroids should be given today.   I would recommend delivery should her NST be nonreactive or should there be concerns regarding the fetal status.

## 2020-11-19 NOTE — Progress Notes (Signed)
Patient ID: Shelby Chandler, female   DOB: 12/01/1990, 30 y.o.   MRN: [id number] [place]) NOTE  Shelby Chandler is a 30 y.o. G1P0000 at [redacted]w[redacted]d  who is admitted for inpatient observation due to severe IUGR with CHTN.    Fetal presentation is cephalic. Length of Stay:  3  Days  Date of admission:11/16/2020  Subjective: Patient reports feeling well and is without complaints. She denies pelvic pain/contractions, leakage of fluid or vaginal bleeding. She reports good fetal movement Patient reports the fetal movement as active. Patient reports uterine contraction  activity as none. Patient reports  vaginal bleeding as none. Patient describes fluid per vagina as None.  Vitals:  Blood pressure (!) 150/93, pulse 77, temperature 98 F (36.7 C), temperature source Oral, resp. rate 18, height 5\' 5"  (1.651 m), weight (!) 199.6 kg, last menstrual period 04/13/2020, SpO2 100 %. Vitals:   11/18/20 1544 11/18/20 2012 11/19/20 0508 11/19/20 0747  BP: 139/88 (!) 155/97 (!) 156/91 (!) 150/93  Pulse: 78 78 74 77  Resp: 18 18 18 18   Temp: 98.2 F (36.8 C) 97.9 F (36.6 C) (!) 97.2 F (36.2 C) 98 F (36.7 C)  TempSrc: Oral Oral Oral Oral  SpO2: 100% 100% 100% 100%  Weight:      Height:       Physical Examination:  GENERAL: Well-developed, well-nourished female in no acute distress.  LUNGS: Clear to auscultation bilaterally.  HEART: Regular rate and rhythm. ABDOMEN: Soft, nontender, gravid, obese PELVIC: Not indicated EXTREMITIES: No cyanosis, clubbing, or edema, 2+ distal pulses.   Fetal Monitoring:  baseline 140, min-mod variability, +10 x 10 accels, occasional variable decels    Labs:  No results found for this or any previous visit (from the past 24 hour(s)).   Imaging Studies:    [place] MFM OB LIMITED  Result Date: 11/16/2020 ----------------------------------------------------------------------  OBSTETRICS REPORT                       (Signed  Final 11/16/2020 01:00 pm) ---------------------------------------------------------------------- Patient Info  ID #:       [id number]                          D.O.B.:  [birth year] (30 yrs)  [name]             Visit Date: 11/16/2020 08:49 am ---------------------------------------------------------------------- Performed By  Attending:        Tama High MD        Ref. Address:     [place]  [place]                                                             [place]  Performed By:     Rodrigo Ran BS      [facility]                                 [facility] at                                                             [place] [facility]                                                             [facility]  Referred By:      [facility] ---------------------------------------------------------------------- Orders  #  Description                           Code        Ordered By  1  [place] MFM UA CORD DOPPLER                76820.02    [place]  2  [place] MFM OB LIMITED                     [id number]    [facility] ----------------------------------------------------------------------  #  Order #                     Accession #                Episode #  1  [id number]                   [id number]                 [id number]  2  [id number]                   [id number]                 [id number] ---------------------------------------------------------------------- Indications  [redacted] weeks gestation of pregnancy                Z3A.1  Maternal care for known or suspected poor      O36.5930  fetal growth, third trimester, not applicable or  unspecified IUGR  Hypertension - Chronic/Pre-existing            O10.019  (nifedipine and propranolol)  Maternal morbid obesity  (BMI 69)               O99.210 E66.01  Anemia during pregnancy in third trimester     O99.013  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)                   Z14.8  Insufficient FF on NIPS / Negative AFP ---------------------------------------------------------------------- Fetal Evaluation  Num Of Fetuses:         1  Fetal Heart Rate(bpm):  135  Cardiac Activity:       Observed  Presentation:           Cephalic  Placenta:               Fundal  Amniotic Fluid  AFI FV:      Oligohydramnios  AFI Sum(cm)     %Tile       Largest Pocket(cm)  5.2             < 3         1.7  RUQ(cm)       RLQ(cm)       LUQ(cm)        LLQ(cm)  1.7           1.4           0.9            1.2 ---------------------------------------------------------------------- OB History  Blood Type:   O-  Gravidity:    1 ---------------------------------------------------------------------- Gestational Age  LMP:           31w 0d        Date:  04/13/20                 EDD:   01/18/21  Best:          31w 0d     Det. By:  LMP  (04/13/20)          EDD:   01/18/21 ---------------------------------------------------------------------- Anatomy  Stomach:               Appears normal, left   Bladder:                Appears normal                         sided ---------------------------------------------------------------------- Doppler - Fetal Vessels  Umbilical Artery                                                              ADFV    RDFV                                                                Yes      No ---------------------------------------------------------------------- Cervix Uterus Adnexa  Cervix  Not visualized (advanced GA >24wks) ---------------------------------------------------------------------- Impression  Shelby Chandler with severe fetal growth restriction chronic  hypertension [facility] with twice weekly [facility].  She takes nifedipine and propranolol for blood pressure  control.  Blood pressure today at her  office is 142/95 mmHg.  She had received the first course of antenatal corticosteroids  On today's  ultrasound, oligohydramnios is seen (AFI 5 cm).  Good fetal activity is present.  Umbilical artery Doppler  showed persistent absent end-diastolic flow  I counseled the patient on ultrasound finding.  Severe fetal  growth restriction and oligohydramnios with abnormal  Doppler studies places the fetus at risk of increased perinatal  mortality and morbidity.  I recommend inpatient management with daily NSTs and  twice-weekly ultrasound.  I recommended delivery at [redacted] weeks gestation.  Patient  agreed with my recommendations.  Discussed with OB attending, Dr. Elly Modena.  Patient will be  going over to [facility] specialty care for admission. ---------------------------------------------------------------------- Recommendations  -[place] (Dr. Elly Modena).  -Daily NSTs.  -BPP and UA Doppler studies on Mon-Thu or more frequently  as needed.  -Neonatology consultation.  -Delivery at 33 weeks' gestation.  -Rescue course of steroids before delivery. ----------------------------------------------------------------------                  Tama High, MD Electronically Signed Final Report   11/16/2020 01:00 pm ----------------------------------------------------------------------  [place] MFM UA CORD DOPPLER  Result Date: 11/16/2020 ----------------------------------------------------------------------  OBSTETRICS REPORT                       (Signed Final 11/16/2020 01:00 pm) ---------------------------------------------------------------------- Patient Info  ID #:       [id number]                          D.O.B.:  1990/12/11 (30 yrs)  [name]             Visit Date: 11/16/2020 08:49 am ---------------------------------------------------------------------- Performed By  Attending:        Tama High MD        Ref. Address:     [place] [place]                                                             [place]  Performed By:     Rodrigo Ran BS      [facility]         [facility]                                 Fetal Care at                                                             [place] for                                                             Women  Referred By:      [facility] ---------------------------------------------------------------------- Orders  #  Description                           Code        Ordered By  1  [place] MFM UA CORD DOPPLER                76820.02    [place]  2  [place] MFM OB LIMITED                     [id number]    [facility] ----------------------------------------------------------------------  #  Order #                     Accession #                Episode #  1  [id number]                   [id number]                 [id number]  2  [id number]                   [id number]                 [id number] ---------------------------------------------------------------------- Indications  [redacted] weeks gestation of pregnancy                Z3A.75  Maternal care for known or suspected poor      O36.5930  fetal growth, third trimester, not applicable or  unspecified IUGR  Hypertension - Chronic/Pre-existing            O10.019  (nifedipine and propranolol)  Maternal morbid obesity (BMI 69)               O99.210 E66.01  Anemia during pregnancy in third trimester     O99.013  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)                   Z14.8  Insufficient FF on NIPS / Negative AFP ---------------------------------------------------------------------- Fetal Evaluation  Num Of Fetuses:         1  Fetal Heart Rate(bpm):  135  Cardiac Activity:       Observed  Presentation:           Cephalic  Placenta:               Fundal  Amniotic Fluid  AFI  FV:      Oligohydramnios  AFI  Sum(cm)     %Tile       Largest Pocket(cm)  5.2             < 3         1.7  RUQ(cm)       RLQ(cm)       LUQ(cm)        LLQ(cm)  1.7           1.4           0.9            1.2 ---------------------------------------------------------------------- OB History  Blood Type:   O-  Gravidity:    1 ---------------------------------------------------------------------- Gestational Age  LMP:           31w 0d        Date:  04/13/20                 EDD:   01/18/21  Best:          [place] Keels 0d     Det. By:  LMP  (04/13/20)          EDD:   01/18/21 ---------------------------------------------------------------------- Anatomy  Stomach:               Appears normal, left   Bladder:                Appears normal                         sided ---------------------------------------------------------------------- Doppler - Fetal Vessels  Umbilical Artery                                                              ADFV    RDFV                                                                Yes      No ---------------------------------------------------------------------- Cervix Uterus Adnexa  Cervix  Not visualized (advanced GA >24wks) ---------------------------------------------------------------------- Impression  Shelby Chandler with severe fetal growth restriction chronic  hypertension is [facility] with twice weekly ultrasound.  She takes nifedipine and propranolol for blood pressure  control.  Blood pressure today at her office is 142/95 mmHg.  She had received the first course of antenatal corticosteroids  On today's ultrasound, oligohydramnios is seen (AFI 5 cm).  Good fetal activity is present.  Umbilical artery Doppler  showed persistent absent end-diastolic flow  I counseled the patient on ultrasound finding.  Severe fetal  growth restriction and oligohydramnios with abnormal  Doppler studies places the fetus at risk of increased perinatal  mortality and morbidity.  I recommend inpatient  management with daily NSTs and  twice-weekly ultrasound.  I recommended delivery at [redacted] weeks gestation.  Patient  agreed with my recommendations.  Discussed with OB attending, Dr. Elly Modena.  Patient will be  going over to [facility] specialty care for admission. ---------------------------------------------------------------------- Recommendations  -Admit Ob Specialty  Care (Dr. Elly Modena).  -Daily NSTs.  -BPP and UA Doppler studies on Mon-Thu or more frequently  as needed.  -Neonatology consultation.  -Delivery at 33 weeks' gestation.  -Rescue course of steroids before delivery. ----------------------------------------------------------------------                  Tama High, MD Electronically Signed Final Report   11/16/2020 01:00 pm ----------------------------------------------------------------------    Medications:  Scheduled  aspirin  81 mg Oral Daily   docusate sodium  100 mg Oral Daily   hydrocortisone   Rectal BID   NIFEdipine  90 mg Oral Daily   prenatal multivitamin  1 tablet Oral Q1200   propranolol  80 mg Oral Q12H   sodium chloride flush  3 mL Intravenous Q12H   I have reviewed the patient's current medications.  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   Anxiety and depression 08/28/2017   Chronic low back pain without sciatica 08/28/2017   History of anemia 08/28/2017   Essential hypertension 01/09/2017   Iron deficiency anemia due to chronic  blood loss 01/09/2017   Class 3 severe obesity due to excess calories without serious comorbidity with body mass index (BMI) of 60.0 to 69.9 in adult ([place]) 01/09/2017    PLAN: 1) CHTN - BP stable - Continue Procardia and Propranolol - Continue ASA  2) IUGR - Fetal status currently stable - Follow up BPP and dopplers scheduled today - NICU consult completed  Delivery for worsening maternal/fetal status Continue current antepartum care    Shelby Chandler 11/19/2020,10:30 AM

## 2020-11-20 ENCOUNTER — Ambulatory Visit: Payer: Managed Care, Other (non HMO)

## 2020-11-20 ENCOUNTER — Inpatient Hospital Stay (HOSPITAL_BASED_OUTPATIENT_CLINIC_OR_DEPARTMENT_OTHER): Payer: Managed Care, Other (non HMO)

## 2020-11-20 DIAGNOSIS — O10013 Pre-existing essential hypertension complicating pregnancy, third trimester: Secondary | ICD-10-CM | POA: Diagnosis not present

## 2020-11-20 DIAGNOSIS — O365930 Maternal care for other known or suspected poor fetal growth, third trimester, not applicable or unspecified: Secondary | ICD-10-CM

## 2020-11-20 DIAGNOSIS — E669 Obesity, unspecified: Secondary | ICD-10-CM

## 2020-11-20 DIAGNOSIS — O99213 Obesity complicating pregnancy, third trimester: Secondary | ICD-10-CM

## 2020-11-20 DIAGNOSIS — O365990 Maternal care for other known or suspected poor fetal growth, unspecified trimester, not applicable or unspecified: Secondary | ICD-10-CM | POA: Diagnosis not present

## 2020-11-20 DIAGNOSIS — Z3A31 31 weeks gestation of pregnancy: Secondary | ICD-10-CM

## 2020-11-20 LAB — TYPE AND SCREEN
ABO/RH(D): O POS
Antibody Screen: NEGATIVE

## 2020-11-20 NOTE — Progress Notes (Signed)
Patient ID: Shelby Chandler, female   DOB: 1991/01/06, 30 y.o.   MRN: [id number] [place]) NOTE  Shelby Chandler is a 30 y.o. G1P0000 at [redacted]w[redacted]d  who is admitted for inpatient observation due to severe IUGR with CHTN.    Fetal presentation is breech. Length of Stay:  4  Days  Date of admission:11/16/2020  Subjective: Patient reports feeling well and remains without complaints. She denies pelvic pain/contractions, leakage of fluid or vaginal bleeding. She reports good fetal movement Patient reports the fetal movement as active. Patient reports uterine contraction  activity as none. Patient reports  vaginal bleeding as none. Patient describes fluid per vagina as None.  Vitals:  Blood pressure (!) 154/86, pulse 77, temperature 98.3 F (36.8 C), temperature source Oral, resp. rate 20, height 5\' 5"  (1.651 m), weight (!) 199.6 kg, last menstrual period 04/13/2020, SpO2 100 %. Vitals:   11/19/20 1547 11/19/20 2017 11/19/20 2247 11/20/20 0734  BP: (!) 156/97 (!) 153/91 140/80 (!) 154/86  Pulse: 76 78 77 77  Resp: 18 18 19 20   Temp: 98.3 F (36.8 C) 98.5 F (36.9 C) 98 F (36.7 C) 98.3 F (36.8 C)  TempSrc: Oral Oral Oral Oral  SpO2: 100% 100% 100% 100%  Weight:      Height:       Physical Examination:  GENERAL: Well-developed, well-nourished female in no acute distress.  LUNGS: Clear to auscultation bilaterally.  HEART: Regular rate and rhythm. ABDOMEN: Soft, nontender, gravid, obese PELVIC: Not indicated EXTREMITIES: No cyanosis, clubbing, or edema, 2+ distal pulses.   Fetal Monitoring:  baseline 145, min-mod variability, +10 x 10 accels, occasional variable decels   [place]: no contractions  Labs:  No results found for this or any previous visit (from the past 24 hour(s)).   Imaging Studies:    [place] MFM FETAL BPP WO NON STRESS  Result Date: 11/19/2020 ----------------------------------------------------------------------  OBSTETRICS  REPORT                       (Signed Final 11/19/2020 05:36 pm) ---------------------------------------------------------------------- Patient Info  ID #:       [id number]                          D.O.B.:  [birth year] (30 yrs)  [name]             Visit Date: 11/19/2020 09:28 am ---------------------------------------------------------------------- Performed By  Attending:        Johnell Comings MD         Ref. Address:     [place]                                                             [place]  Performed By:     Valda Favia          [facility]                    [place]  Referred By:      [place] ---------------------------------------------------------------------- Orders  #  Description                           Code        Ordered By  1  [place] MFM UA CORD DOPPLER                76820.02    [place]  2  [place] MFM FETAL BPP WO NON               76819.01    [place] ----------------------------------------------------------------------  #  Order #                     Accession #                Episode #  1  [id number]                   [id number]                 [id number]  2  [id number]                   [id number]                 [id number] ---------------------------------------------------------------------- Indications  Maternal care for known or suspected poor      O36.5930  fetal growth, third trimester, not applicable or  unspecified IUGR  [redacted] weeks gestation of pregnancy                Z3A.31  Hypertension - Chronic/Pre-existing            O10.019  (nifedipine and propranolol)  Maternal morbid obesity (BMI 49)               O99.210 E66.01  Anemia during pregnancy in third trimester      O99.013  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)                   Z14.8  Insufficient FF on NIPS / Negative AFP ---------------------------------------------------------------------- Fetal Evaluation  Num Of Fetuses:         1  Fetal Heart Rate(bpm):  124  Cardiac Activity:       Observed  Presentation:           Breech  Amniotic Fluid  AFI FV:      Oligohydramnios  AFI Sum(cm)     %Tile       Largest Pocket(cm)  3.2             < 3         2.1  RUQ(cm)       RLQ(cm)       LUQ(cm)        LLQ(cm)  1.6           1.6           0              0 ---------------------------------------------------------------------- Biophysical Evaluation  Amniotic F.V:   Pocket => 2 cm             F. Tone:        Observed  F. Movement:    Observed                   Score:          6/8  F. Breathing:   Not Observed ---------------------------------------------------------------------- OB History  Blood Type:   O-  Gravidity:    1 ---------------------------------------------------------------------- Gestational Age  LMP:           31w 3d        Date:  04/13/20                 EDD:   01/18/21  Best:          Burke Keels 3d     Det. By:  LMP  (04/13/20)          EDD:   01/18/21 ---------------------------------------------------------------------- Doppler - Fetal Vessels  Umbilical Artery                                                              ADFV    RDFV                                                                Yes      No ---------------------------------------------------------------------- Comments  This patient has been hospitalized due to IUGR with absent  end-diastolic flow and chronic hypertension.  A biophysical profile performed today was 6 out of 8.  She  received a -2 for fetal breathing movements that did not meet  criteria.  The amniotic fluid level remains low with a total AFI of 3.2  cm.  A greater than 2 cm vertical pocket of amniotic fluid is  present.  Doppler studies of the umbilical arteries performed today  continues to show  absent end-diastolic flow.  There were no  signs of reversed end-diastolic flow noted today.  The patient should have an NST performed today.  She  should have another BPP repeated tomorrow.  Due to the decrease in amniotic fluid and the likelihood that  she will require delivery soon, a rescue course of steroids  should be given today.  I would recommend delivery should her NST be nonreactive  or should there be concerns regarding the fetal status. ----------------------------------------------------------------------                   Johnell Comings, MD Electronically Signed Final  Report   11/19/2020 05:36 pm ----------------------------------------------------------------------  [place] MFM OB LIMITED  Result Date: 11/16/2020 ----------------------------------------------------------------------  OBSTETRICS REPORT                       (Signed Final 11/16/2020 01:00 pm) ---------------------------------------------------------------------- Patient Info  ID #:       [id number]                          D.O.B.:  04-Sep-1990 (30 yrs)  [name]             Visit Date: 11/16/2020 08:49 am ---------------------------------------------------------------------- Performed By  Attending:        Tama High MD        Ref. Address:     [address]                                                             [address] [place]                                                             [place]  Performed By:     Rodrigo Ran BS      [facility]                                                             [facility]                                                             [facility]  Referred By:      [facility] ----------------------------------------------------------------------  Orders  #  Description                           Code        Ordered By  1  [place] MFM UA CORD DOPPLER                76820.02    RAVI SHANKAR  2  [place] MFM OB LIMITED                     [id number]    RAVI [facility] ----------------------------------------------------------------------  #  Order #                     Accession #                Episode #  1  [id number]                   [id number]                 [id number]  2  [id number]                   [id number]                 [id number] ---------------------------------------------------------------------- Indications  [redacted] weeks gestation of pregnancy                Z3A.64  Maternal care for known or suspected poor      O36.5930  fetal growth, third trimester, not applicable or  unspecified IUGR  Hypertension - Chronic/Pre-existing            O10.019  (nifedipine and propranolol)  Maternal morbid obesity (BMI 69)               O99.210 E66.01  Anemia during pregnancy in third trimester     O99.013  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)                   Z14.8  Insufficient FF on NIPS / Negative AFP ---------------------------------------------------------------------- Fetal Evaluation  Num Of Fetuses:         1  Fetal Heart Rate(bpm):  135  Cardiac Activity:       Observed  Presentation:           Cephalic  Placenta:               Fundal  Amniotic Fluid  AFI FV:      Oligohydramnios  AFI Sum(cm)     %Tile       Largest Pocket(cm)  5.2             < 3         1.7  RUQ(cm)       RLQ(cm)       LUQ(cm)        LLQ(cm)  1.7           1.4           0.9            1.2 ---------------------------------------------------------------------- OB History  Blood Type:   O-  Gravidity:    1 ---------------------------------------------------------------------- Gestational Age  LMP:           31w 0d        Date:  04/13/20  EDD:   01/18/21  Best:          Burke Keels 0d     Det. By:  LMP  (04/13/20)          EDD:   01/18/21 ----------------------------------------------------------------------  Anatomy  Stomach:               Appears normal, left   Bladder:                Appears normal                         sided ---------------------------------------------------------------------- Doppler - Fetal Vessels  Umbilical Artery                                                              ADFV    RDFV                                                                Yes      No ---------------------------------------------------------------------- Cervix Uterus Adnexa  Cervix  Not visualized (advanced GA >24wks) ---------------------------------------------------------------------- Impression  Ms. Doepke with severe fetal growth restriction chronic  hypertension [facility].  She takes nifedipine and propranolol for blood pressure  control.  Blood pressure today at her office is 142/95 mmHg.  She had received the first course of antenatal corticosteroids  On today's ultrasound, oligohydramnios is seen (AFI 5 cm).  Good fetal activity is present.  Umbilical artery Doppler  showed persistent absent end-diastolic flow  I counseled the patient on ultrasound finding.  Severe fetal  growth restriction and oligohydramnios with abnormal  Doppler studies places the fetus at risk of increased perinatal  mortality and morbidity.  I recommend inpatient management with daily NSTs and  twice-weekly ultrasound.  I recommended delivery at [redacted] weeks gestation.  Patient  agreed with my recommendations.  Discussed with OB attending, Dr. Elly Modena.  Patient will be  going over to [facility] specialty care for admission. ---------------------------------------------------------------------- Recommendations  -[place] (Dr. Elly Modena).  -Daily NSTs.  -BPP and UA Doppler studies on Mon-Thu or more frequently  as needed.  -Neonatology consultation.  -Delivery at 33 weeks' gestation.  -Rescue course of steroids before delivery.  ----------------------------------------------------------------------                  Tama High, MD Electronically Signed Final Report   11/16/2020 01:00 pm ----------------------------------------------------------------------  [place] MFM UA CORD DOPPLER  Result Date: 11/19/2020 ----------------------------------------------------------------------  OBSTETRICS REPORT                       (Signed Final 11/19/2020 05:36 pm) ---------------------------------------------------------------------- Patient Info  ID #:       [id number]                          D.O.B.:  1990/03/17 (30 yrs)  [name]  Visit Date: 11/19/2020 09:28 am ---------------------------------------------------------------------- Performed By  Attending:        Johnell Comings MD         Ref. Address:     [address]                                                             [address] [place]                                                             [place]  Performed By:     Valda Favia          Location[facility]                    [place]  Referred By:      [place] ---------------------------------------------------------------------- Orders  #  Description                           Code        Ordered By  1  [place] MFM UA CORD DOPPLER                76820.02    [place]  2  [place] MFM FETAL BPP WO NON               89381.01    [place] ----------------------------------------------------------------------  #  Order #                     Accession #                Episode #  1  [id number]                   [id number]                 [id number]  2  [id number]                   [id number]  [id number] ---------------------------------------------------------------------- Indications  Maternal  care for known or suspected poor      O36.5930  fetal growth, third trimester, not applicable or  unspecified IUGR  [redacted] weeks gestation of pregnancy                Z3A.31  Hypertension - Chronic/Pre-existing            O10.019  (nifedipine and propranolol)  Maternal morbid obesity (BMI 69)               O99.210 E66.01  Anemia during pregnancy in third trimester     O99.013  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)                   Z14.8  Insufficient FF on NIPS / Negative AFP ---------------------------------------------------------------------- Fetal Evaluation  Num Of Fetuses:         1  Fetal Heart Rate(bpm):  124  Cardiac Activity:       Observed  Presentation:           Breech  Amniotic Fluid  AFI FV:      Oligohydramnios  AFI Sum(cm)     %Tile       Largest Pocket(cm)  3.2             < 3         2.1  RUQ(cm)       RLQ(cm)       LUQ(cm)        LLQ(cm)  1.6           1.6           0              0 ---------------------------------------------------------------------- Biophysical Evaluation  Amniotic F.V:   Pocket => 2 cm             F. Tone:        Observed  F. Movement:    Observed                   Score:          6/8  F. Breathing:   Not Observed ---------------------------------------------------------------------- OB History  Blood Type:   O-  Gravidity:    1 ---------------------------------------------------------------------- Gestational Age  LMP:           31w 3d        Date:  04/13/20                 EDD:   01/18/21  Best:          Burke Keels 3d     Det. By:  LMP  (04/13/20)          EDD:   01/18/21 ---------------------------------------------------------------------- Doppler - Fetal Vessels  Umbilical Artery                                                              ADFV    RDFV  Yes      No ---------------------------------------------------------------------- Comments  This patient has been hospitalized due to IUGR with absent  end-diastolic flow and  chronic hypertension.  A biophysical profile performed today was 6 out of 8.  She  received a -2 for fetal breathing movements that did not meet  criteria.  The amniotic fluid level remains low with a total AFI of 3.2  cm.  A greater than 2 cm vertical pocket of amniotic fluid is  present.  Doppler studies of the umbilical arteries performed today  continues to show absent end-diastolic flow.  There were no  signs of reversed end-diastolic flow noted today.  The patient should have an NST performed today.  She  should have another BPP repeated tomorrow.  Due to the decrease in amniotic fluid and the likelihood that  she will require delivery soon, a rescue course of steroids  should be given today.  I would recommend delivery should her NST be nonreactive  or should there be concerns regarding the fetal status. ----------------------------------------------------------------------                   Johnell Comings, MD Electronically Signed Final Report   11/19/2020 05:36 pm ----------------------------------------------------------------------  [place] MFM UA CORD DOPPLER  Result Date: 11/16/2020 ----------------------------------------------------------------------  OBSTETRICS REPORT                       (Signed Final 11/16/2020 01:00 pm) ---------------------------------------------------------------------- Patient Info  ID #:       [id number]                          D.O.B.:  [birth year] (30 yrs)  [name]             Visit Date: 11/16/2020 08:49 am ---------------------------------------------------------------------- Performed By  Attending:        Tama High MD        Ref. Address:     [address]                                                             [address]                                                             [place]                                                             [place]  Performed  By:      Rodrigo Ran BS      [facility]                                 [facility] at                                                             [place] [facility]                                                             Women  Referred By:      [facility] ---------------------------------------------------------------------- Orders  #  Description                           Code        Ordered By  1  [place] MFM UA CORD DOPPLER                76820.02    [place]  2  [place] MFM OB LIMITED                     76815.01    RAVI [facility] ----------------------------------------------------------------------  #  Order #                     Accession #                Episode #  1  [id number]                   [id number]                 [id number]  2  [id number]                   [id number]                 [id number] ---------------------------------------------------------------------- Indications  [redacted] weeks gestation of pregnancy                Z3A.42  Maternal care for known or suspected poor      O36.5930  fetal growth, third trimester, not applicable or  unspecified IUGR  Hypertension - Chronic/Pre-existing            O10.019  (nifedipine and propranolol)  Maternal morbid obesity (BMI 69)               O99.210 E66.01  Anemia during pregnancy in third trimester     O99.013  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)                   Z14.8  Insufficient FF on NIPS / Negative AFP ---------------------------------------------------------------------- Fetal Evaluation  Num Of Fetuses:         1  Fetal Heart Rate(bpm):  135  Cardiac Activity:       Observed  Presentation:           Cephalic  Placenta:               Fundal  Amniotic Fluid  AFI FV:      Oligohydramnios  AFI Sum(cm)     %Tile       Largest Pocket(cm)  5.2             < 3         1.7  RUQ(cm)       RLQ(cm)       LUQ(cm)        LLQ(cm)  1.7           1.4           0.9            1.2  ---------------------------------------------------------------------- OB History  Blood Type:   O-  Gravidity:    1 ---------------------------------------------------------------------- Gestational Age  LMP:           31w 0d        Date:  04/13/20                 EDD:   01/18/21  Best:          Burke Keels 0d     Det. By:  LMP  (04/13/20)          EDD:   01/18/21 ---------------------------------------------------------------------- Anatomy  Stomach:               Appears normal, left   Bladder:                Appears normal                         sided ---------------------------------------------------------------------- Doppler - Fetal Vessels  Umbilical Artery                                                              ADFV    RDFV                                                                Yes      No ---------------------------------------------------------------------- Cervix Uterus Adnexa  Cervix  Not visualized (advanced GA >24wks) ---------------------------------------------------------------------- Impression  Ms. Cashin with severe fetal growth restriction chronic  hypertension [facility] with twice weekly [facility].  She takes nifedipine and propranolol for blood pressure  control.  Blood pressure today at her office is 142/95 mmHg.  She had received the first course of antenatal corticosteroids  On today's ultrasound, oligohydramnios is seen (AFI 5 cm).  Good fetal activity is present.  Umbilical artery Doppler  showed persistent absent end-diastolic flow  I counseled the patient on ultrasound finding.  Severe fetal  growth restriction and oligohydramnios with abnormal  Doppler studies places the fetus at risk of increased perinatal  mortality and morbidity.  I recommend inpatient management with daily NSTs and  twice-weekly ultrasound.  I recommended delivery at [redacted] weeks gestation.  Patient  agreed with my recommendations.  Discussed with OB attending,  Dr. Elly Modena.  Patient will be  going over to [facility] specialty care for admission. ---------------------------------------------------------------------- Recommendations  -[place] (Dr. Elly Modena).  -Daily NSTs.  -BPP and UA Doppler studies on Mon-Thu or more frequently  as needed.  -Neonatology consultation.  -Delivery at 33 weeks' gestation.  -Rescue course of steroids before delivery. ----------------------------------------------------------------------                  Tama High, MD Electronically Signed Final Report   11/16/2020 01:00 pm ----------------------------------------------------------------------    Medications:  Scheduled  aspirin  81 mg Oral Daily   betamethasone acetate-betamethasone sodium phosphate  12 mg Intramuscular Q24 Hr x 2   docusate sodium  100 mg Oral Daily   hydrocortisone   Rectal BID   NIFEdipine  90 mg Oral Daily   prenatal multivitamin  1 tablet Oral Q1200   propranolol  80 mg Oral Q12H   sodium chloride flush  3 mL Intravenous Q12H   I have reviewed the patient's current medications.  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   Anxiety and depression 08/28/2017   Chronic low back pain without sciatica 08/28/2017   History of anemia 08/28/2017   Essential hypertension 01/09/2017   Iron deficiency anemia due to chronic blood loss 01/09/2017   Class 3 severe obesity due to excess calories without serious  comorbidity with body mass index (BMI) of 60.0 to 69.9 in adult ([place]) 01/09/2017    PLAN: 1) CHTN - BP remains stable stable - Continue Procardia and Propranolol at same dose - Continue ASA  2) IUGR - Fetal status currently stable - Repeat BPP today - NICU consult completed - Rescue dose of BMZ started yesterday; patient due to second dose today  Delivery for worsening maternal/fetal status Continue current antepartum care    Shermaine Brigham 11/20/2020,9:29 AM

## 2020-11-21 ENCOUNTER — Inpatient Hospital Stay (HOSPITAL_COMMUNITY): Payer: Managed Care, Other (non HMO) | Admitting: Anesthesiology

## 2020-11-21 ENCOUNTER — Inpatient Hospital Stay (HOSPITAL_BASED_OUTPATIENT_CLINIC_OR_DEPARTMENT_OTHER): Payer: Managed Care, Other (non HMO)

## 2020-11-21 ENCOUNTER — Encounter (HOSPITAL_COMMUNITY): Payer: Self-pay | Admitting: Obstetrics and Gynecology

## 2020-11-21 ENCOUNTER — Encounter (HOSPITAL_COMMUNITY)
Admission: AD | Disposition: A | Discharge status: Home / Self Care | Payer: Self-pay | Source: Home / Self Care | Attending: Obstetrics and Gynecology

## 2020-11-21 DIAGNOSIS — O10013 Pre-existing essential hypertension complicating pregnancy, third trimester: Secondary | ICD-10-CM

## 2020-11-21 DIAGNOSIS — O1092 Unspecified pre-existing hypertension complicating childbirth: Secondary | ICD-10-CM

## 2020-11-21 DIAGNOSIS — O365990 Maternal care for other known or suspected poor fetal growth, unspecified trimester, not applicable or unspecified: Secondary | ICD-10-CM | POA: Diagnosis not present

## 2020-11-21 DIAGNOSIS — D649 Anemia, unspecified: Secondary | ICD-10-CM

## 2020-11-21 DIAGNOSIS — O4103X0 Oligohydramnios, third trimester, not applicable or unspecified: Secondary | ICD-10-CM

## 2020-11-21 DIAGNOSIS — Z3A31 31 weeks gestation of pregnancy: Secondary | ICD-10-CM

## 2020-11-21 DIAGNOSIS — O365930 Maternal care for other known or suspected poor fetal growth, third trimester, not applicable or unspecified: Secondary | ICD-10-CM

## 2020-11-21 DIAGNOSIS — O288 Other abnormal findings on antenatal screening of mother: Secondary | ICD-10-CM

## 2020-11-21 DIAGNOSIS — O99013 Anemia complicating pregnancy, third trimester: Secondary | ICD-10-CM | POA: Diagnosis not present

## 2020-11-21 DIAGNOSIS — O119 Pre-existing hypertension with pre-eclampsia, unspecified trimester: Secondary | ICD-10-CM

## 2020-11-21 DIAGNOSIS — O99824 Streptococcus B carrier state complicating childbirth: Secondary | ICD-10-CM

## 2020-11-21 DIAGNOSIS — O1414 Severe pre-eclampsia complicating childbirth: Secondary | ICD-10-CM

## 2020-11-21 LAB — COMPREHENSIVE METABOLIC PANEL
ALT: 14 U/L (ref 0–44)
AST: 23 U/L (ref 15–41)
Albumin: 2.6 g/dL — ABNORMAL LOW (ref 3.5–5.0)
Alkaline Phosphatase: 152 U/L — ABNORMAL HIGH (ref 38–126)
Anion gap: 11 (ref 5–15)
BUN: 14 mg/dL (ref 6–20)
CO2: 21 mmol/L — ABNORMAL LOW (ref 22–32)
Calcium: 8.9 mg/dL (ref 8.9–10.3)
Chloride: 104 mmol/L (ref 98–111)
Creatinine, Ser: 0.81 mg/dL (ref 0.44–1.00)
GFR, Estimated: >60 mL/min (ref >60)
Glucose, Bld: 78 mg/dL (ref 70–99)
Potassium: 4.3 mmol/L (ref 3.5–5.1)
Sodium: 136 mmol/L (ref 135–145)
Total Bilirubin: 0.8 mg/dL (ref 0.3–1.2)
Total Protein: 6.9 g/dL (ref 6.5–8.1)

## 2020-11-21 LAB — CBC
HCT: 34.5 % — ABNORMAL LOW (ref 36.0–46.0)
HCT: 35.1 % — ABNORMAL LOW (ref 36.0–46.0)
Hemoglobin: 10.6 g/dL — ABNORMAL LOW (ref 12.0–15.0)
Hemoglobin: 11.1 g/dL — ABNORMAL LOW (ref 12.0–15.0)
MCH: 23.7 pg — ABNORMAL LOW (ref 26.0–34.0)
MCH: 23.8 pg — ABNORMAL LOW (ref 26.0–34.0)
MCHC: 30.7 g/dL (ref 30.0–36.0)
MCHC: 31.6 g/dL (ref 30.0–36.0)
MCV: 77 fL — ABNORMAL LOW (ref 80.0–100.0)
MCV: 75.3 fL — ABNORMAL LOW (ref 80.0–100.0)
Platelets: 424 10*3/uL — ABNORMAL HIGH (ref 150–400)
Platelets: 445 10*3/uL — ABNORMAL HIGH (ref 150–400)
RBC: 4.48 MIL/uL (ref 3.87–5.11)
RBC: 4.66 MIL/uL (ref 3.87–5.11)
RDW: 16.2 % — ABNORMAL HIGH (ref 11.5–15.5)
RDW: 16.3 % — ABNORMAL HIGH (ref 11.5–15.5)
WBC: 11.7 10*3/uL — ABNORMAL HIGH (ref 4.0–10.5)
WBC: 14.4 10*3/uL — ABNORMAL HIGH (ref 4.0–10.5)
nRBC: 0 % (ref 0.0–0.2)
nRBC: 0 % (ref 0.0–0.2)

## 2020-11-21 SURGERY — Surgical Case
Anesthesia: Spinal

## 2020-11-21 MED ORDER — FLEET ENEMA 7-19 GM/118ML RE ENEM: 1.0000 | ENEMA | RECTAL | Status: DC | PRN

## 2020-11-21 MED ORDER — ONDANSETRON HCL 4 MG/2ML IJ SOLN: 4.0000 mg | Freq: Four times a day (QID) | INTRAMUSCULAR | Status: DC | PRN

## 2020-11-21 MED ORDER — MORPHINE SULFATE (PF) 0.5 MG/ML IJ SOLN
INTRAMUSCULAR | Status: DC | PRN
  Administered 2020-11-21: .15 mg via EPIDURAL

## 2020-11-21 MED ORDER — WITCH HAZEL-GLYCERIN EX PADS: 1.0000 "application " | MEDICATED_PAD | CUTANEOUS | Status: DC | PRN

## 2020-11-21 MED ORDER — METHYLERGONOVINE MALEATE 0.2 MG/ML IJ SOLN: INTRAMUSCULAR | Status: DC | PRN

## 2020-11-21 MED ORDER — SODIUM CHLORIDE 0.9 % IR SOLN
Status: DC | PRN
  Administered 2020-11-21: 1000 mL

## 2020-11-21 MED ORDER — COCONUT OIL OIL
1.0000 "application " | TOPICAL_OIL | Status: DC | PRN
  Administered 2020-11-22: 1 via TOPICAL

## 2020-11-21 MED ORDER — SODIUM BICARBONATE 8.4 % IV SOLN
INTRAVENOUS | Status: DC | PRN
  Administered 2020-11-21: 3 mL via EPIDURAL
  Administered 2020-11-21: 7 mL via EPIDURAL
  Administered 2020-11-21: 5 mL via EPIDURAL

## 2020-11-21 MED ORDER — SODIUM CHLORIDE 0.9 % IV SOLN
5.0000 10*6.[IU] | Freq: Once | INTRAVENOUS | Status: AC
  Administered 2020-11-21: 5 10*6.[IU] via INTRAVENOUS
  Filled 2020-11-21: qty 5

## 2020-11-21 MED ORDER — LABETALOL HCL 5 MG/ML IV SOLN
40.0000 mg | INTRAVENOUS | Status: DC | PRN
  Administered 2020-11-21: 40 mg via INTRAVENOUS
  Filled 2020-11-21: qty 8

## 2020-11-21 MED ORDER — FENTANYL CITRATE (PF) 100 MCG/2ML IJ SOLN
INTRAMUSCULAR | Status: AC
  Filled 2020-11-21: qty 2

## 2020-11-21 MED ORDER — KETOROLAC TROMETHAMINE 30 MG/ML IJ SOLN: 30.0000 mg | Freq: Four times a day (QID) | INTRAMUSCULAR | Status: AC | PRN

## 2020-11-21 MED ORDER — PENICILLIN G POT IN DEXTROSE 60000 UNIT/ML IV SOLN: 3.0000 10*6.[IU] | INTRAVENOUS | Status: DC

## 2020-11-21 MED ORDER — ACETAMINOPHEN 10 MG/ML IV SOLN
1000.0000 mg | Freq: Once | INTRAVENOUS | Status: AC
  Administered 2020-11-21: 1000 mg via INTRAVENOUS

## 2020-11-21 MED ORDER — MEPERIDINE HCL 25 MG/ML IJ SOLN: 6.2500 mg | INTRAMUSCULAR | Status: DC | PRN

## 2020-11-21 MED ORDER — NALOXONE HCL 4 MG/10ML IJ SOLN
1.0000 ug/kg/h | INTRAVENOUS | Status: DC | PRN
  Filled 2020-11-21: qty 5

## 2020-11-21 MED ORDER — BUPIVACAINE HCL (PF) 0.25 % IJ SOLN
INTRAMUSCULAR | Status: AC
  Filled 2020-11-21: qty 30

## 2020-11-21 MED ORDER — MENTHOL 3 MG MT LOZG: 1.0000 | LOZENGE | OROMUCOSAL | Status: DC | PRN

## 2020-11-21 MED ORDER — LACTATED RINGERS IV SOLN: INTRAVENOUS | Status: DC

## 2020-11-21 MED ORDER — ONDANSETRON HCL 4 MG/2ML IJ SOLN
4.0000 mg | Freq: Three times a day (TID) | INTRAMUSCULAR | Status: DC | PRN
  Administered 2020-11-22: 4 mg via INTRAVENOUS
  Filled 2020-11-21: qty 2

## 2020-11-21 MED ORDER — LIDOCAINE HCL (PF) 1 % IJ SOLN: 30.0000 mL | INTRAMUSCULAR | Status: DC | PRN

## 2020-11-21 MED ORDER — SOD CITRATE-CITRIC ACID 500-334 MG/5ML PO SOLN: 30.0000 mL | ORAL | Status: DC | PRN

## 2020-11-21 MED ORDER — OXYCODONE HCL 5 MG PO TABS
5.0000 mg | ORAL_TABLET | ORAL | Status: DC | PRN
  Administered 2020-11-22 – 2020-11-24 (×4): 10 mg via ORAL
  Filled 2020-11-21 (×5): qty 2

## 2020-11-21 MED ORDER — MAGNESIUM SULFATE 40 GM/1000ML IV SOLN
2.0000 g/h | INTRAVENOUS | Status: DC
  Administered 2020-11-21 – 2020-11-22 (×2): 2 g/h via INTRAVENOUS
  Filled 2020-11-21 (×2): qty 1000

## 2020-11-21 MED ORDER — MEASLES, MUMPS & RUBELLA VAC IJ SOLR: 0.5000 mL | Freq: Once | INTRAMUSCULAR | Status: DC

## 2020-11-21 MED ORDER — FENTANYL CITRATE (PF) 100 MCG/2ML IJ SOLN
25.0000 ug | INTRAMUSCULAR | Status: DC | PRN
  Administered 2020-11-21: 50 ug via INTRAVENOUS
  Administered 2020-11-21: 25 ug via INTRAVENOUS
  Administered 2020-11-21 (×2): 50 ug via INTRAVENOUS

## 2020-11-21 MED ORDER — OXYTOCIN-SODIUM CHLORIDE 30-0.9 UT/500ML-% IV SOLN
1.0000 m[IU]/min | INTRAVENOUS | Status: DC
  Administered 2020-11-21: 2 m[IU]/min via INTRAVENOUS
  Filled 2020-11-21: qty 500

## 2020-11-21 MED ORDER — ACETAMINOPHEN 500 MG PO TABS
1000.0000 mg | ORAL_TABLET | Freq: Four times a day (QID) | ORAL | Status: DC
  Administered 2020-11-22 – 2020-11-25 (×12): 1000 mg via ORAL
  Filled 2020-11-21 (×13): qty 2

## 2020-11-21 MED ORDER — OXYTOCIN BOLUS FROM INFUSION: 333.0000 mL | Freq: Once | INTRAVENOUS | Status: DC

## 2020-11-21 MED ORDER — DIPHENHYDRAMINE HCL 25 MG PO CAPS: 25.0000 mg | ORAL_CAPSULE | ORAL | Status: DC | PRN

## 2020-11-21 MED ORDER — TERBUTALINE SULFATE 1 MG/ML IJ SOLN: 0.2500 mg | Freq: Once | INTRAMUSCULAR | Status: DC | PRN

## 2020-11-21 MED ORDER — SIMETHICONE 80 MG PO CHEW: 80.0000 mg | CHEWABLE_TABLET | ORAL | Status: DC | PRN

## 2020-11-21 MED ORDER — HYDRALAZINE HCL 20 MG/ML IJ SOLN: 10.0000 mg | INTRAMUSCULAR | Status: DC | PRN

## 2020-11-21 MED ORDER — ENOXAPARIN SODIUM 100 MG/ML IJ SOSY
100.0000 mg | PREFILLED_SYRINGE | INTRAMUSCULAR | Status: DC
  Administered 2020-11-22 – 2020-11-25 (×4): 100 mg via SUBCUTANEOUS
  Filled 2020-11-21 (×7): qty 1

## 2020-11-21 MED ORDER — LABETALOL HCL 5 MG/ML IV SOLN
80.0000 mg | INTRAVENOUS | Status: DC | PRN
  Administered 2020-11-21: 80 mg via INTRAVENOUS

## 2020-11-21 MED ORDER — DEXAMETHASONE SODIUM PHOSPHATE 4 MG/ML IJ SOLN
INTRAMUSCULAR | Status: DC | PRN
  Administered 2020-11-21: 4 mg via INTRAVENOUS

## 2020-11-21 MED ORDER — OXYTOCIN-SODIUM CHLORIDE 30-0.9 UT/500ML-% IV SOLN
INTRAVENOUS | Status: DC | PRN
  Administered 2020-11-21: 300 mL via INTRAVENOUS

## 2020-11-21 MED ORDER — SODIUM CHLORIDE 0.9% FLUSH: 3.0000 mL | INTRAVENOUS | Status: DC | PRN

## 2020-11-21 MED ORDER — DIPHENHYDRAMINE HCL 25 MG PO CAPS: 25.0000 mg | ORAL_CAPSULE | Freq: Four times a day (QID) | ORAL | Status: DC | PRN

## 2020-11-21 MED ORDER — NALBUPHINE HCL 10 MG/ML IJ SOLN
5.0000 mg | INTRAMUSCULAR | Status: DC | PRN
Start: 2020-11-21 — End: 2020-11-25
  Administered 2020-11-22: 5 mg via INTRAVENOUS
  Filled 2020-11-21: qty 1

## 2020-11-21 MED ORDER — DEXMEDETOMIDINE (PRECEDEX) IN NS 20 MCG/5ML (4 MCG/ML) IV SYRINGE
PREFILLED_SYRINGE | INTRAVENOUS | Status: DC | PRN
  Administered 2020-11-21: 8 ug via INTRAVENOUS

## 2020-11-21 MED ORDER — ONDANSETRON HCL 4 MG/2ML IJ SOLN
INTRAMUSCULAR | Status: AC
  Filled 2020-11-21: qty 2

## 2020-11-21 MED ORDER — ENOXAPARIN SODIUM 40 MG/0.4ML IJ SOSY: 40.0000 mg | PREFILLED_SYRINGE | INTRAMUSCULAR | Status: DC

## 2020-11-21 MED ORDER — ACETAMINOPHEN 325 MG PO TABS
650.0000 mg | ORAL_TABLET | ORAL | Status: DC | PRN
  Administered 2020-11-21: 650 mg via ORAL
  Filled 2020-11-21: qty 2

## 2020-11-21 MED ORDER — GLYCOPYRROLATE 0.2 MG/ML IJ SOLN
INTRAMUSCULAR | Status: DC | PRN
  Administered 2020-11-21: .2 mg via INTRAVENOUS

## 2020-11-21 MED ORDER — DIBUCAINE (PERIANAL) 1 % EX OINT: 1.0000 "application " | TOPICAL_OINTMENT | CUTANEOUS | Status: DC | PRN

## 2020-11-21 MED ORDER — LABETALOL HCL 5 MG/ML IV SOLN
80.0000 mg | INTRAVENOUS | Status: DC | PRN
  Filled 2020-11-21: qty 16

## 2020-11-21 MED ORDER — TETANUS-DIPHTH-ACELL PERTUSSIS 5-2.5-18.5 LF-MCG/0.5 IM SUSY
0.5000 mL | PREFILLED_SYRINGE | Freq: Once | INTRAMUSCULAR | Status: AC
  Administered 2020-11-22: 0.5 mL via INTRAMUSCULAR
  Filled 2020-11-21: qty 0.5

## 2020-11-21 MED ORDER — OXYCODONE-ACETAMINOPHEN 5-325 MG PO TABS
2.0000 | ORAL_TABLET | ORAL | Status: DC | PRN
Start: 2020-11-21 — End: 2020-11-25

## 2020-11-21 MED ORDER — LABETALOL HCL 5 MG/ML IV SOLN: 40.0000 mg | INTRAVENOUS | Status: DC | PRN

## 2020-11-21 MED ORDER — DIPHENHYDRAMINE HCL 50 MG/ML IJ SOLN: 12.5000 mg | INTRAMUSCULAR | Status: DC | PRN

## 2020-11-21 MED ORDER — ONDANSETRON HCL 4 MG/2ML IJ SOLN
INTRAMUSCULAR | Status: DC | PRN
  Administered 2020-11-21: 4 mg via INTRAVENOUS

## 2020-11-21 MED ORDER — OXYTOCIN-SODIUM CHLORIDE 30-0.9 UT/500ML-% IV SOLN: 2.5000 [IU]/h | INTRAVENOUS | Status: DC

## 2020-11-21 MED ORDER — ACETAMINOPHEN 10 MG/ML IV SOLN
INTRAVENOUS | Status: AC
  Filled 2020-11-21: qty 100

## 2020-11-21 MED ORDER — BUPIVACAINE IN DEXTROSE 0.75-8.25 % IT SOLN
INTRATHECAL | Status: DC | PRN
  Administered 2020-11-21: 1.8 mL via INTRATHECAL

## 2020-11-21 MED ORDER — MAGNESIUM SULFATE BOLUS VIA INFUSION
6.0000 g | Freq: Once | INTRAVENOUS | Status: AC
  Administered 2020-11-21: 6 g via INTRAVENOUS
  Filled 2020-11-21: qty 1000

## 2020-11-21 MED ORDER — OXYTOCIN-SODIUM CHLORIDE 30-0.9 UT/500ML-% IV SOLN
2.5000 [IU]/h | INTRAVENOUS | Status: AC
  Administered 2020-11-21: 2.5 [IU]/h via INTRAVENOUS
  Filled 2020-11-21: qty 500

## 2020-11-21 MED ORDER — SENNOSIDES-DOCUSATE SODIUM 8.6-50 MG PO TABS
2.0000 | ORAL_TABLET | Freq: Every day | ORAL | Status: DC
  Administered 2020-11-22 – 2020-11-25 (×4): 2 via ORAL
  Filled 2020-11-21 (×5): qty 2

## 2020-11-21 MED ORDER — PHENYLEPHRINE HCL (PRESSORS) 10 MG/ML IV SOLN
INTRAVENOUS | Status: DC | PRN
  Administered 2020-11-21 (×2): 80 ug via INTRAVENOUS

## 2020-11-21 MED ORDER — NALOXONE HCL 0.4 MG/ML IJ SOLN: 0.4000 mg | INTRAMUSCULAR | Status: DC | PRN

## 2020-11-21 MED ORDER — SIMETHICONE 80 MG PO CHEW
80.0000 mg | CHEWABLE_TABLET | Freq: Three times a day (TID) | ORAL | Status: DC
  Administered 2020-11-22 – 2020-11-25 (×11): 80 mg via ORAL
  Filled 2020-11-21 (×10): qty 1

## 2020-11-21 MED ORDER — DEXAMETHASONE SODIUM PHOSPHATE 4 MG/ML IJ SOLN
INTRAMUSCULAR | Status: AC
  Filled 2020-11-21: qty 1

## 2020-11-21 MED ORDER — SOD CITRATE-CITRIC ACID 500-334 MG/5ML PO SOLN: 30.0000 mL | ORAL | Status: DC

## 2020-11-21 MED ORDER — NALBUPHINE HCL 10 MG/ML IJ SOLN: 5.0000 mg | INTRAMUSCULAR | Status: DC | PRN

## 2020-11-21 MED ORDER — KETOROLAC TROMETHAMINE 30 MG/ML IJ SOLN
30.0000 mg | Freq: Four times a day (QID) | INTRAMUSCULAR | Status: AC | PRN
  Administered 2020-11-21: 30 mg via INTRAVENOUS
  Filled 2020-11-21: qty 1

## 2020-11-21 MED ORDER — DEXMEDETOMIDINE (PRECEDEX) IN NS 20 MCG/5ML (4 MCG/ML) IV SYRINGE
PREFILLED_SYRINGE | INTRAVENOUS | Status: AC
  Filled 2020-11-21: qty 5

## 2020-11-21 MED ORDER — FENTANYL CITRATE (PF) 100 MCG/2ML IJ SOLN
INTRAMUSCULAR | Status: DC | PRN
  Administered 2020-11-21: 15 ug via INTRATHECAL

## 2020-11-21 MED ORDER — MISOPROSTOL 25 MCG QUARTER TABLET
25.0000 ug | ORAL_TABLET | ORAL | Status: DC | PRN
  Filled 2020-11-21: qty 1

## 2020-11-21 MED ORDER — OXYCODONE-ACETAMINOPHEN 5-325 MG PO TABS: 2.0000 | ORAL_TABLET | ORAL | Status: DC | PRN

## 2020-11-21 MED ORDER — KETAMINE HCL 50 MG/5ML IJ SOSY
PREFILLED_SYRINGE | INTRAMUSCULAR | Status: AC
  Filled 2020-11-21: qty 5

## 2020-11-21 MED ORDER — OXYCODONE-ACETAMINOPHEN 5-325 MG PO TABS: 1.0000 | ORAL_TABLET | ORAL | Status: DC | PRN

## 2020-11-21 MED ORDER — NALBUPHINE HCL 10 MG/ML IJ SOLN
5.0000 mg | Freq: Once | INTRAMUSCULAR | Status: DC | PRN
Start: 2020-11-21 — End: 2020-11-25

## 2020-11-21 MED ORDER — MIDAZOLAM HCL 2 MG/2ML IJ SOLN
INTRAMUSCULAR | Status: DC | PRN
  Administered 2020-11-21: 2 mg via INTRAVENOUS

## 2020-11-21 MED ORDER — GLYCOPYRROLATE PF 0.2 MG/ML IJ SOSY
PREFILLED_SYRINGE | INTRAMUSCULAR | Status: AC
  Filled 2020-11-21: qty 1

## 2020-11-21 MED ORDER — KETOROLAC TROMETHAMINE 30 MG/ML IJ SOLN
INTRAMUSCULAR | Status: AC
  Filled 2020-11-21: qty 1

## 2020-11-21 MED ORDER — CEFAZOLIN IN SODIUM CHLORIDE 3-0.9 GM/100ML-% IV SOLN
3.0000 g | INTRAVENOUS | Status: DC
  Filled 2020-11-21: qty 100

## 2020-11-21 MED ORDER — PHENYLEPHRINE HCL-NACL 20-0.9 MG/250ML-% IV SOLN
INTRAVENOUS | Status: DC | PRN
  Administered 2020-11-21: 60 ug/min via INTRAVENOUS

## 2020-11-21 MED ORDER — KETAMINE HCL 10 MG/ML IJ SOLN
INTRAMUSCULAR | Status: DC | PRN
  Administered 2020-11-21: 40 mg via INTRAVENOUS

## 2020-11-21 MED ORDER — MORPHINE SULFATE (PF) 0.5 MG/ML IJ SOLN
INTRAMUSCULAR | Status: AC
  Filled 2020-11-21: qty 10

## 2020-11-21 MED ORDER — BUPIVACAINE HCL 0.25 % IJ SOLN
INTRAMUSCULAR | Status: DC | PRN
  Administered 2020-11-21: 30 mL

## 2020-11-21 MED ORDER — LABETALOL HCL 5 MG/ML IV SOLN
INTRAVENOUS | Status: AC
  Administered 2020-11-21: 20 mg via INTRAVENOUS
  Filled 2020-11-21: qty 4

## 2020-11-21 MED ORDER — LACTATED RINGERS IV SOLN: INTRAVENOUS | Status: DC | PRN

## 2020-11-21 MED ORDER — LABETALOL HCL 5 MG/ML IV SOLN: 20.0000 mg | INTRAVENOUS | Status: DC | PRN

## 2020-11-21 MED ORDER — PRENATAL MULTIVITAMIN CH
1.0000 | ORAL_TABLET | Freq: Every day | ORAL | Status: DC
  Administered 2020-11-22 – 2020-11-25 (×4): 1 via ORAL
  Filled 2020-11-21 (×5): qty 1

## 2020-11-21 MED ORDER — LABETALOL HCL 5 MG/ML IV SOLN
20.0000 mg | INTRAVENOUS | Status: DC | PRN
  Filled 2020-11-21: qty 4

## 2020-11-21 MED ORDER — DEXTROSE 5 % IV SOLN
INTRAVENOUS | Status: DC | PRN
  Administered 2020-11-21: 3 g via INTRAVENOUS

## 2020-11-21 MED ORDER — MIDAZOLAM HCL 2 MG/2ML IJ SOLN
INTRAMUSCULAR | Status: AC
  Filled 2020-11-21: qty 2

## 2020-11-21 MED ORDER — LACTATED RINGERS IV SOLN: 500.0000 mL | INTRAVENOUS | Status: DC | PRN

## 2020-11-21 SURGICAL SUPPLY — 39 items
BARRIER ADHS 3X4 INTERCEED (GAUZE/BANDAGES/DRESSINGS) ×2 IMPLANT
BENZOIN TINCTURE PRP APPL 2/3 (GAUZE/BANDAGES/DRESSINGS) ×2 IMPLANT
CANISTER WOUND CARE 500ML ATS (WOUND CARE) ×2 IMPLANT
CLAMP CORD UMBIL (MISCELLANEOUS) ×2 IMPLANT
CLOTH BEACON ORANGE TIMEOUT ST (SAFETY) ×2 IMPLANT
DRESSING PREVENA PLUS CUSTOM (GAUZE/BANDAGES/DRESSINGS) ×1 IMPLANT
DRSG OPSITE POSTOP 4X10 (GAUZE/BANDAGES/DRESSINGS) ×2 IMPLANT
DRSG PREVENA PLUS CUSTOM (GAUZE/BANDAGES/DRESSINGS) ×2
ELECT REM PT RETURN 9FT ADLT (ELECTROSURGICAL) ×2
ELECTRODE REM PT RTRN 9FT ADLT (ELECTROSURGICAL) ×1 IMPLANT
EXTRACTOR VACUUM M CUP 4 TUBE (SUCTIONS) IMPLANT
GLOVE BIOGEL PI IND STRL 7.0 (GLOVE) ×3 IMPLANT
GLOVE BIOGEL PI INDICATOR 7.0 (GLOVE) ×3
GLOVE ECLIPSE 7.0 STRL STRAW (GLOVE) ×2 IMPLANT
GOWN STRL REUS W/TWL LRG LVL3 (GOWN DISPOSABLE) ×4 IMPLANT
KIT ABG SYR 3ML LUER SLIP (SYRINGE) ×2 IMPLANT
MAT PREVALON FULL STRYKER (MISCELLANEOUS) ×2 IMPLANT
NEEDLE HYPO 22GX1.5 SAFETY (NEEDLE) ×2 IMPLANT
NEEDLE HYPO 25X5/8 SAFETYGLIDE (NEEDLE) ×2 IMPLANT
NS IRRIG 1000ML POUR BTL (IV SOLUTION) ×2 IMPLANT
PACK C SECTION WH (CUSTOM PROCEDURE TRAY) ×2 IMPLANT
PAD ABD 7.5X8 STRL (GAUZE/BANDAGES/DRESSINGS) ×2 IMPLANT
PAD OB MATERNITY 4.3X12.25 (PERSONAL CARE ITEMS) ×2 IMPLANT
PENCIL SMOKE EVAC W/HOLSTER (ELECTROSURGICAL) ×2 IMPLANT
RETAINER VISCERAL (MISCELLANEOUS) ×2 IMPLANT
RETRACTOR TRAXI PANNICULUS (MISCELLANEOUS) ×2 IMPLANT
RTRCTR C-SECT PINK 25CM LRG (MISCELLANEOUS) ×2 IMPLANT
SPONGE T-LAP 18X18 ~~LOC~~+RFID (SPONGE) ×4 IMPLANT
STRIP CLOSURE SKIN 1/2X4 (GAUZE/BANDAGES/DRESSINGS) ×2 IMPLANT
SUT MNCRL 0 VIOLET CTX 36 (SUTURE) ×2 IMPLANT
SUT MONOCRYL 0 CTX 36 (SUTURE) ×2
SUT VIC AB 0 CTX 36 (SUTURE) ×1
SUT VIC AB 0 CTX36XBRD ANBCTRL (SUTURE) ×1 IMPLANT
SUT VIC AB 4-0 KS 27 (SUTURE) ×2 IMPLANT
SYR 30ML LL (SYRINGE) ×2 IMPLANT
TOWEL OR 17X24 6PK STRL BLUE (TOWEL DISPOSABLE) ×2 IMPLANT
TRAXI PANNICULUS RETRACTOR (MISCELLANEOUS) ×2
TRAY FOLEY W/BAG SLVR 14FR LF (SET/KITS/TRAYS/PACK) ×2 IMPLANT
WATER STERILE IRR 1000ML POUR (IV SOLUTION) ×2 IMPLANT

## 2020-11-21 NOTE — Anesthesia Postprocedure Evaluation (Signed)
Anesthesia Post Note  Patient: Shelby Chandler  Procedure(s) Performed: [place]     Patient location during evaluation: PACU Anesthesia Type: Spinal Level of consciousness: oriented and awake and alert Pain management: pain level controlled Vital Signs Assessment: post-procedure vital signs reviewed and stable Respiratory status: spontaneous breathing, respiratory function stable and nonlabored ventilation Cardiovascular status: blood pressure returned to baseline and stable Postop Assessment: no headache, no backache, no apparent nausea or vomiting, spinal receding, patient able to bend at knees and epidural receding Anesthetic complications: no   No notable events documented.  Last Vitals:  Vitals:   11/21/20 2015 11/21/20 2030  BP: (!) 107/93 128/88  Pulse: (!) 58 62  Resp: 15 14  Temp: 36.7 C   SpO2: 97% 99%    Last Pain:  Vitals:   11/21/20 2030  TempSrc:   PainSc: 3    Pain Goal: Patients Stated Pain Goal: 3 (11/16/20 1325)    LLE Sensation: Tingling (11/21/20 2030)   RLE Sensation: Tingling (11/21/20 2030)     Epidural/Spinal Function Cutaneous sensation: Able to Discern Pressure (11/21/20 2030), Patient able to flex knees: Yes (11/21/20 2030), Patient able to lift hips off bed: Yes (11/21/20 2030), Back pain beyond tenderness at insertion site: No (11/21/20 2030), Progressively worsening motor and/or sensory loss: No (11/21/20 2030), Bowel and/or bladder incontinence post epidural: No (11/21/20 2030)  Tamel Abel A.

## 2020-11-21 NOTE — Progress Notes (Signed)
Epidural catheter removed at 2045. Dr. Domingo Madeira removal without new labs.

## 2020-11-21 NOTE — Anesthesia Procedure Notes (Signed)
Spinal  Patient location during procedure: OR Start time: 11/21/2020 5:22 PM End time: 11/21/2020 5:30 PM Reason for block: surgical anesthesia Staffing Performed: anesthesiologist  Anesthesiologist: Josephine Igo, MD Preanesthetic Checklist Completed: patient identified, IV checked, site marked, risks and benefits discussed, surgical consent, monitors and equipment checked, pre-op evaluation and timeout performed Spinal Block Patient position: sitting Prep: DuraPrep and site prepped and draped Patient monitoring: cardiac monitor, continuous pulse ox, blood pressure and heart rate Approach: midline Location: L3-4 Injection technique: catheter Needle Needle type: Tuohy and Pencan  Needle gauge: 25 G Needle length: 12.7 cm Needle insertion depth: 8 cm Catheter type: closed end flexible Catheter size: 19 g Catheter at skin depth: 14 cm Assessment Sensory level: T4 Events: CSF return Additional Notes Epidural performed using LOR with air technique. No CSF, Heme or paresthesias. SAB performed through the epidural needle using 25ga Pencan needle. CSF clear with free flow and no paresthesias. Local anesthetic and narcotics injected through the spinal needle and the spinal needle was then withdrawn. Epidural catheter threaded 6cm into the epidural space and the epidural needle was withdrawn. A sterile dressing was applied and the patient placed supine with LUD. The patient tolerated the procedure well and adequate sensory level was obtained.

## 2020-11-21 NOTE — Discharge Summary (Signed)
Postpartum Discharge Summary  Date of Service updated     Patient Name: Shelby Chandler DOB: 1990-11-07 MRN: [id number]  Date of admission: 11/16/2020 Delivery date:11/21/2020  Delivering provider: Donnamae Jude  Date of discharge: 11/25/2020  Admitting diagnosis: Fetal growth restriction antepartum [O36.5990] Intrauterine pregnancy: [redacted]w[redacted]d    Secondary diagnosis:  Active Problems:   Essential hypertension   Supervision of high risk pregnancy, antepartum   Hypertension during pregnancy in second trimester   BMI 70 and over, adult ([facility]   Delivery by classical cesarean section   Chronic hypertension with superimposed pre-eclampsia  Additional problems: None    Discharge diagnosis: Term Pregnancy Delivered                                              Post partum procedures: None Augmentation:  Unsuccessful CST Complications: None  Hospital course: Induction of Labor With Cesarean Section  - Classical c-section 30y.o. yo G1P0000 at 335w5das admitted to the hospital 11/16/2020 for induction of labor. Patient was indicated for delivery based on BPP in s/o IUGR, oligo and had a CST which was NRFHT. The patient went for cesarean section due to Non-Reassuring FHR. Delivery details are as follows: Membrane Rupture Time/Date: 6:10 PM ,11/21/2020   Delivery Method: C-section, Classical Details of operation can be found in separate operative Note.  Patient had an uncomplicated postpartum course. She received Magnesium and was started on her home medications as well as lasix and lisinopril and with this regimen she had mild range Bps or normotensive. She is ambulating, tolerating a regular diet, passing flatus, and urinating well.  Patient is discharged home (to  NICU) in stable condition on 11/25/20.      Newborn Data: Birth date:11/21/2020  Birth time:6:11 PM  Gender:Female  Living status:Living  Apgars:7 ,9  Weight:920 g                                Magnesium Sulfate  received: Yes: Seizure prophylaxis BMZ received: Yes Rhophylac:N/A MMR:N/A T-DaP: Declined Flu: No Transfusion:No  Physical exam  Vitals:   11/24/20 2304 11/25/20 0005 11/25/20 0307 11/25/20 0816  BP:  (!) 141/69 (!) 143/71 (!) 144/82  Pulse: 67 68 74 75  Resp: _0 Temp: 98 F (36.7 C) 97.7 F (36.5 C) 98 F (36.7 C)   TempSrc: Oral Oral Oral Oral  SpO2: 100% 99% 99% 100%  Weight:      Height:       General: alert, cooperative, and no distress Lochia: appropriate Uterine Fundus: firm Incision: Prevena well applied and intact over incision DVT Evaluation: No evidence of DVT seen on physical exam. Labs: Lab Results  Component Value Date   WBC 18.7 (H) 11/22/2020   HGB 9.8 (L) 11/22/2020   HCT 31.4 (L) 11/22/2020   MCV 76.8 (L) 11/22/2020   PLT 430 (H) 11/22/2020   CMP Latest Ref Rng & Units 11/21/2020  Glucose 70 - 99 mg/dL 78  BUN 6 - 20 mg/dL 14  Creatinine 0.44 - 1.00 mg/dL 0.81  Sodium 135 - 145 mmol/L 136  Potassium 3.5 - 5.1 mmol/L 4.3  Chloride 98 - 111 mmol/L 104  CO2 22 - 32 mmol/L 21(L)  Calcium 8.9 - 10.3 mg/dL 8.9  Total Protein 6.5 -  8.1 g/dL 6.9  Total Bilirubin 0.3 - 1.2 mg/dL 0.8  Alkaline Phos 38 - 126 U/L 152(H)  AST 15 - 41 U/L 23  ALT 0 - 44 U/L 14   Edinburgh Score: Edinburgh Postnatal Depression Scale Screening Tool 11/23/2020  I have been able to laugh and see the funny side of things. 0  I have looked forward with enjoyment to things. 0  I have blamed myself unnecessarily when things went wrong. 0  I have been anxious or worried for no good reason. 2  I have felt scared or panicky for no good reason. 2  Things have been getting on top of me. 0  I have been so unhappy that I have had difficulty sleeping. 2  I have felt sad or miserable. 0  I have been so unhappy that I have been crying. 0  The thought of harming myself has occurred to me. 0  Edinburgh Postnatal Depression Scale Total 6     After visit meds:  Allergies as  of 11/25/2020   No Known Allergies      Medication List     STOP taking these medications    aspirin EC 81 MG tablet   Blood Pressure Cuff Misc   cyclobenzaprine 10 MG tablet Commonly known as: FLEXERIL       TAKE these medications    acetaminophen 500 MG tablet Commonly known as: TYLENOL Take 2 tablets (1,000 mg total) by mouth every 6 (six) hours.   Ferrous Sulfate 220 (44 Fe) MG/5ML Soln Take 5 mLs by mouth every other day.   furosemide 20 MG tablet Commonly known as: LASIX Take 1 tablet (20 mg total) by mouth 2 (two) times daily.   ibuprofen 600 MG tablet Commonly known as: ADVIL Take 1 tablet (600 mg total) by mouth every 6 (six) hours.   lisinopril 5 MG tablet Commonly known as: ZESTRIL Take 1 tablet (5 mg total) by mouth daily. Start taking on: November 26, 2020   NIFEdipine 90 MG 24 hr tablet Commonly known as: PROCARDIA XL/NIFEDICAL-XL Take 1 tablet (90 mg total) by mouth daily. Start taking on: November 26, 2020   PRENATAL 1 PO Take 1 tablet by mouth daily. Gummies/Unknown strenght   propranolol 80 MG tablet Commonly known as: INDERAL Take 1 tablet (80 mg total) by mouth every 12 (twelve) hours. What changed: when to take this               Discharge Care Instructions  (From admission, onward)           Start     Ordered   11/25/20 0000  Discharge wound care:       Comments: If dressing is present, remove after 7 days from surgery   11/25/20 1247             Discharge home in stable condition Infant Feeding: Bottle Infant Disposition:NICU Discharge instruction: per After Visit Summary and Postpartum booklet. Activity: Advance as tolerated. Pelvic rest for 6 weeks.  Diet: low salt diet Future Appointments: Future Appointments  Date Time Provider [place]  11/30/2020  4:20 PM Berniece Salines, DO [facility]  12/01/2020  4:10 PM Woodroe Mode, MD [place] None  12/24/2020  1:10 PM Chancy Milroy, MD  [place] None   Follow up Visit:  [place] Follow up in 1 week(s).   Specialty: Obstetrics and Gynecology Contact information: [address], [address]  [place] [phone]               Message to [place] by Dr. Cy Blamer on 11/21/2020  Please schedule this patient for a In person postpartum visit in 4 weeks with the following provider: MD. Additional Postpartum F/U:Incision check 1 week and BP check High risk pregnancy complicated by: HTN Delivery mode:  C-Section, Low Transverse  Anticipated Birth Control:  Unsure   11/25/2020 Radene Gunning, MD

## 2020-11-21 NOTE — Progress Notes (Signed)
Patient ID: Shelby Chandler, female   DOB: 11/19/1990, 30 y.o.   MRN: [id number] Consult with MFM about BPP 4/8. FHR is ok--will move to delivery Discussed fetus may not tolerate attempts at vaginal delivery.  Healing with C-section discussed as well. Notified labor and delivery.

## 2020-11-21 NOTE — Op Note (Addendum)
Op note Cesarean Section  Preoperative Diagnosis:  IUP @ [redacted]w[redacted]d, IUGR (<1%ile), absent end diastolic flow, cHTN with superimposed pre-eclampsia, BPP 4/8, fetal intolerance of labor  Postoperative Diagnosis:  Same, Classical vertical incision cesarean section  Procedure: Primary Classical cesarean section  Surgeon: Darron Doom, M.D.  Assistant: Renard Matter, MD; Gerlene Fee, DO  Anesthesia: spinal with Josephine Igo, MD  Findings: Viable girl infant, APGAR (1 MIN):   APGAR (5 MINS):  7 APGAR (10 MINS):  9 Normal tubes and ovaries and Appendix visualized   Estimated blood loss: 356cc UOP: 100cc Fluids: 1224MG LR  Complications: None known  Specimens: Placenta to pathology  Reason for procedure: Briefly, the patient is a 30 y.o. G1P0000 at [redacted]w[redacted]d with  IUGR (<1%ile), absent end diastolic flow, cHTN with superimposed pre-eclampsia, BPP 4/8, fetal intolerance of lab  Procedure: Patient is a to the OR where epidural analgesia was administered. She was then placed in a supine position with left lateral tilt. She received 3 g of Ancef and SCDs were in place. A Foley catheter was placed in the bladder. She was prepped and draped in the usual sterile fashion. A timeout was performed. A knife was then used to make a Pfannenstiel incision. This incision was carried out to underlying fascia which was divided in the midline with the knife. The incision was extended laterally, sharply. The fascia was dissected of the underlying rectus superiorly.  The rectus was divided in the midline. There was limited space and the incision was extended at the skin and subcutaneous tissue and the rectus on the right. Alexis retractor was placed inside the incision.   Due to small size of uterus and extremely limited visualization of lower uterine segment a vertical midline incision on the uterus was made. The incision was then extended with bandage scissors. Fetus was in vertex position and was brought up out  of the incision without difficulty.delayed cord clamping x 1 minute. Cord was clamped x 2 and cut. Infant taken to waiting neonatology team.  Cord blood and cord gas was obtained. Placenta was delivered from the uterus.    Uterus was cleaned with dry lap pads. The vertical uterine incision was then closed with 2 layers of 0 Monocryl suture and a third layer baseball stitch closure was done. Upon closure of vertical incision intercede was placed at site of vertical incision.  Alexis retractor was removed from the abdomen. Fascia was then closed with 0 Vicryl suture in a running fashion. Subcutaneous closure was performed with 0 Plain suture.  Skin closed using 4-0 Vicryl on a Keith needle.  Upon closure the subcutaneous tissue infused with 30cc 0.25% Marcaine. Prevena wound vac placed.  All instrument, needle and lap counts were correct x 2.  Renard Matter, MD, MPH OB Fellow, [place] for [facility])   Surgical Attestation: I was present for the entirety of this procedure, gloved and immediately available during the procedure.  This procedure was done under my direct supervision. Donnamae Jude 11/22/2020 9:47 AM

## 2020-11-21 NOTE — Anesthesia Preprocedure Evaluation (Addendum)
Anesthesia Evaluation  Patient identified by MRN, date of birth, ID band Patient awake    Reviewed: Allergy & Precautions, NPO status , Patient's Chart, lab work & pertinent test results, reviewed documented beta blocker date and time   Airway Mallampati: II  TM Distance: >3 FB Neck ROM: Full    Dental no notable dental hx. (+) Teeth Intact, Dental Advisory Given   Pulmonary neg pulmonary ROS,    Pulmonary exam normal breath sounds clear to auscultation       Cardiovascular hypertension, Pt. on medications and Pt. on home beta blockers Normal cardiovascular exam Rhythm:Regular Rate:Normal     Neuro/Psych PSYCHIATRIC DISORDERS Anxiety Depression negative neurological ROS     GI/Hepatic Neg liver ROS, GERD  ,  Endo/Other  Morbid obesity  Renal/GU negative Renal ROS  negative genitourinary   Musculoskeletal negative musculoskeletal ROS (+)   Abdominal (+) + obese,   Peds  Hematology  (+) anemia ,   Anesthesia Other Findings   Reproductive/Obstetrics (+) Pregnancy BPP 4/8 today  31 5/7 weeks                            Anesthesia Physical Anesthesia Plan  ASA: 3  Anesthesia Plan: Spinal and Combined Spinal and Epidural   Post-op Pain Management:    Induction:   PONV Risk Score and Plan: 4 or greater and Treatment may vary due to age or medical condition  Airway Management Planned: Natural Airway  Additional Equipment:   Intra-op Plan:   Post-operative Plan:   Informed Consent: I have reviewed the patients History and Physical, chart, labs and discussed the procedure including the risks, benefits and alternatives for the proposed anesthesia with the patient or authorized representative who has indicated his/her understanding and acceptance.       Plan Discussed with: CRNA and Anesthesiologist  Anesthesia Plan Comments:        Anesthesia Quick Evaluation

## 2020-11-21 NOTE — Progress Notes (Signed)
Katerine Morua Krasner is a 30 y.o. G1P0000 at [redacted]w[redacted]d transferred from antepartum for trial of induction of labor due to [facility] 4/8, poor fetal growth (<1%ile) and  absent end diastolic flow .  Subjective: Feeling ok.   Objective: BP (!) 165/92   Pulse 78   Temp 98.6 F (37 C) (Oral)   Resp 16   Ht 5\' 5"  (1.651 m)   Wt (!) 199.6 kg   LMP 04/13/2020 (Exact Date)   SpO2 100%   BMI 73.22 kg/m  No intake/output data recorded. No intake/output data recorded.  FHT:  FHR: 130 bpm, variability: minimal ,  accelerations:  Abscent,  decelerations:  Absent UC:   None SVE:   Dilation: Closed Exam by:: Dr. Cy Blamer  Labs: Lab Results  Component Value Date   WBC 14.4 (H) 11/21/2020   HGB 11.1 (L) 11/21/2020   HCT 35.1 (L) 11/21/2020   MCV 75.3 (L) 11/21/2020   PLT 445 (H) 11/21/2020    Assessment / Plan:  Transferred from antepartum for trial of induction of labor due to BPP 4/8, poor fetal growth (<1%ile) and  absent end diastolic flow .  Will trial CST (start pitocin at 2 and up by 2 every 20 mins until get 3 contractions in 10 mins). If tolerated by fetus, will attempt placement of foley bulb and consider Cytotec. If signs of intolerance of contractions/labor will plan on delivery by cesarean   cHTN:  On  Nifedipine 90mg  daily and Propranolol 80mg  every 12 hours. Elevated to   I/D:   GBS positive -  PCN   Renard Matter 11/21/2020, 3:30 PM

## 2020-11-21 NOTE — Transfer of Care (Signed)
Immediate Anesthesia Transfer of Care Note  Patient: Shelby Chandler  Procedure(s) Performed: CESAREAN SECTION  Patient Location: PACU  Anesthesia Type:Spinal and Epidural  Level of Consciousness: awake  Airway & Oxygen Therapy: Patient Spontanous Breathing  Post-op Assessment: Report given to RN and Post -op Vital signs reviewed and stable  Post vital signs: Reviewed and stable  Last Vitals:  Vitals Value Taken Time  BP 122/79 11/21/20 1945  Temp    Pulse 66 11/21/20 1947  Resp 15 11/21/20 1947  SpO2 94 % 11/21/20 1947  Vitals shown include unvalidated device data.  Last Pain:  Vitals:   11/21/20 1600  TempSrc:   PainSc: 0-No pain      Patients Stated Pain Goal: 3 (99/35/70 1779)  Complications: No notable events documented.

## 2020-11-21 NOTE — Progress Notes (Signed)
Patient ID: Shelby Chandler, female   DOB: 03-20-1990, 30 y.o.   MRN: [id number] Patient with equivocal CST-->will move toward abdominal delivery. Risks include but are not limited to bleeding, infection, injury to surrounding structures, including bowel, bladder and ureters, blood clots, and death.  Likelihood of success is high. Discussed BMI, prevena wound vac

## 2020-11-21 NOTE — Progress Notes (Signed)
Patient ID: Shelby Chandler, female   DOB: [birth year], 30 y.o.   MRN: [id number] [place]) NOTE  Shelby Chandler is a 30 y.o. G1P0000 with Estimated Date of Delivery: 01/18/21   By  early ultrasound [redacted]w[redacted]d  who is admitted for .    Fetal presentation is breech. Length of Stay:  5  Days  Date of admission:11/16/2020  Subjective: No complaints Patient reports the fetal movement as active. Patient reports uterine contraction  activity as none. Patient reports  vaginal bleeding as none. Patient describes fluid per vagina as None.  Vitals:  Blood pressure (!) 148/89, pulse 74, temperature 98.1 F (36.7 C), temperature source Oral, resp. rate 18, height 5\' 5"  (1.651 m), weight (!) 199.6 kg, last menstrual period 04/13/2020, SpO2 98 %. Vitals:   11/20/20 1936 11/20/20 2055 11/21/20 0125 11/21/20 0412  BP: 138/79  (!) 152/83 (!) 148/89  Pulse: 80 84 77 74  Resp:    18  Temp:   (!) 97.5 F (36.4 C) 98.1 F (36.7 C)  TempSrc:   Oral Oral  SpO2:   100% 98%  Weight:      Height:       Physical Examination:  General appearance - alert, well appearing, and in no distress Abdomen - soft, nontender, nondistended, no masses or organomegaly Fundal Height:  difficult to assess due to size Pelvic Exam:  examination not indicated Cervical Exam: Not evaluated.  Extremities: extremities normal, atraumatic, no cyanosis or edema with DTRs 2+ bilaterally Membranes:intact  Fetal Monitoring:  Baseline: 130s bpm, Variability: Fair (1-6 bpm), Accelerations: Reactive, and Decelerations: 1 last night 2 minutes spontaneous resolution otherwise no decels noted    reactive  Labs:  Results for orders placed or performed during the hospital encounter of 11/16/20 (from the past 24 hour(s))  Type and screen [place]   Collection Time: 11/20/20  7:41 PM  Result Value Ref Range   ABO/RH(D) O POS    Antibody Screen NEG    Sample Expiration       11/23/2020,2359 Performed at [facility], [place] [address] [place],  [address]     Imaging Studies:    [place] MFM FETAL BPP WO NON STRESS  Result Date: 11/20/2020 ----------------------------------------------------------------------  OBSTETRICS REPORT                       (Signed Final 11/20/2020 05:55 pm) ---------------------------------------------------------------------- Patient Info  ID #:       [id number]                          D.O.B.:  1990-03-01 (30 yrs)  [name]             Visit Date: 11/20/2020 09:49 am ---------------------------------------------------------------------- Performed By  Attending:        Tama High MD        Ref. Address:     [address]  [place]                                                             [place]  Performed By:     Valda Favia          [facility]                    [place]  Referred By:      [place] ---------------------------------------------------------------------- Orders  #  Description                           Code        Ordered By  1  [place] MFM FETAL BPP WO NON               76819.01    [place]  2  [place] MFM UA CORD DOPPLER                76820.02    [place] ----------------------------------------------------------------------  #  Order #                     Accession #                Episode #  1  [id number]                   [id number]                 [id number]  2  [id number]                   [id number]                 [id number] ---------------------------------------------------------------------- Indications  Maternal care for known or suspected poor      O36.5930  fetal growth, third trimester, not applicable or   unspecified IUGR  Hypertension - Chronic/Pre-existing            O10.019  (nifedipine and propranolol)  [redacted] weeks gestation of pregnancy                Z3A.31  Maternal morbid obesity (BMI 69)               O99.210 E66.01  Anemia during pregnancy in third trimester     O99.013  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)                   Z14.8  Insufficient FF on NIPS / Negative AFP ---------------------------------------------------------------------- Fetal Evaluation  Num Of Fetuses:  1  Fetal Heart Rate(bpm):  143  Cardiac Activity:       Observed  Presentation:           Cephalic  Amniotic Fluid  AFI FV:      Subjectively decreased  AFI Sum(cm)     %Tile       Largest Pocket(cm)  7.7             < 3         2.5  RUQ(cm)       RLQ(cm)       LUQ(cm)        LLQ(cm)  2             1.8           2.5            1.4 ---------------------------------------------------------------------- Biophysical Evaluation  Amniotic F.V:   Pocket => 2 cm             F. Tone:        Observed  F. Movement:    Observed                   Score:          6/8  F. Breathing:   Not Observed ---------------------------------------------------------------------- OB History  Blood Type:   O-  Gravidity:    1 ---------------------------------------------------------------------- Gestational Age  LMP:           31w 4d        Date:  04/13/20                 EDD:   01/18/21  Best:          31w 4d     Det. By:  LMP  (04/13/20)          EDD:   01/18/21 ---------------------------------------------------------------------- Doppler - Fetal Vessels  Umbilical Artery                                                              ADFV    RDFV                                                                  Y       N ---------------------------------------------------------------------- Impression  Chronic hypertension.  Patient takes nifedipine, propranolol  and Lasix for control.  Severe fetal growth restriction.  Amniotic fluid is decreased for this gestational  age but no  oligohydramnios is seen.  Fetal breathing movements did not  meet the criteria of BPP.  Umbilical artery Doppler showed  persistent absent end-diastolic flow.  No reversed end-  diastolic flow is seen.  BPP 6 out of 8.  I reviewed the NST that is reassuring.  I discussed with Dr. Elonda Husky. ---------------------------------------------------------------------- Recommendations  Repeat BPP tomorrow.  If BPP score is 6 out of 8 or less, I  recommend delivery.  -If NST is not reassuring, delivery should be considered. ----------------------------------------------------------------------  Tama High, MD Electronically Signed Final Report   11/20/2020 05:55 pm ----------------------------------------------------------------------  [place] MFM FETAL BPP WO NON STRESS  Result Date: 11/19/2020 ----------------------------------------------------------------------  OBSTETRICS REPORT                       (Signed Final 11/19/2020 05:36 pm) ---------------------------------------------------------------------- Patient Info  ID #:       [id number]                          D.O.B.:  Oct 10, 1990 (30 yrs)  [name]             Visit Date: 11/19/2020 09:28 am ---------------------------------------------------------------------- Performed By  Attending:        Johnell Comings MD         Ref. Address:     [place]                                                             [place]  Performed By:     Valda Favia          Location[facility]         [facility]                    [place]  Referred By:      [place] ---------------------------------------------------------------------- Orders  #  Description                           Code         Ordered By  1  [place] MFM UA CORD DOPPLER                76820.02    [place]  2  [place] MFM FETAL BPP WO NON               [id number]  [place]     STRESS ----------------------------------------------------------------------  #  Order #                     Accession #                Episode #  1  [id number]                   [id number]                 [id number]  2  [id number]                   [id number]                 [id number] ---------------------------------------------------------------------- Indications  Maternal care for known or suspected poor      O36.5930  fetal growth, third trimester, not applicable or  unspecified IUGR  [redacted] weeks gestation of pregnancy                Z3A.31  Hypertension - Chronic/Pre-existing            O10.019  (nifedipine and propranolol)  Maternal morbid obesity (BMI 87)               O99.210 E66.01  Anemia during pregnancy in third trimester     O99.013  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)                   Z14.8  Insufficient FF on NIPS / Negative AFP ---------------------------------------------------------------------- Fetal Evaluation  Num Of Fetuses:         1  Fetal Heart Rate(bpm):  124  Cardiac Activity:       Observed  Presentation:           Breech  Amniotic Fluid  AFI FV:      Oligohydramnios  AFI Sum(cm)     %Tile       Largest Pocket(cm)  3.2             < 3         2.1  RUQ(cm)       RLQ(cm)       LUQ(cm)        LLQ(cm)  1.6           1.6           0              0 ---------------------------------------------------------------------- Biophysical Evaluation  Amniotic F.V:   Pocket => 2 cm             F. Tone:        Observed  F. Movement:    Observed                   Score:          6/8  F. Breathing:   Not Observed ---------------------------------------------------------------------- OB History  Blood Type:   O-  Gravidity:    1 ---------------------------------------------------------------------- Gestational Age  LMP:           31w 3d        Date:  04/13/20                  EDD:   01/18/21  Best:          Burke Keels 3d     Det. By:  LMP  (04/13/20)          EDD:  01/18/21 ---------------------------------------------------------------------- Doppler - Fetal Vessels  Umbilical Artery                                                              ADFV    RDFV                                                                Yes      No ---------------------------------------------------------------------- Comments  This patient has been hospitalized due to IUGR with absent  end-diastolic flow and chronic hypertension.  A biophysical profile performed today was 6 out of 8.  She  received a -2 for fetal breathing movements that did not meet  criteria.  The amniotic fluid level remains low with a total AFI of 3.2  cm.  A greater than 2 cm vertical pocket of amniotic fluid is  present.  Doppler studies of the umbilical arteries performed today  continues to show absent end-diastolic flow.  There were no  signs of reversed end-diastolic flow noted today.  The patient should have an NST performed today.  She  should have another BPP repeated tomorrow.  Due to the decrease in amniotic fluid and the likelihood that  she will require delivery soon, a rescue course of steroids  should be given today.  I would recommend delivery should her NST be nonreactive  or should there be concerns regarding the fetal status. ----------------------------------------------------------------------                   Johnell Comings, MD Electronically Signed Final Report   11/19/2020 05:36 pm ----------------------------------------------------------------------  [place] MFM UA CORD DOPPLER  Result Date: 11/20/2020 ----------------------------------------------------------------------  OBSTETRICS REPORT                       (Signed Final 11/20/2020 05:55 pm) ---------------------------------------------------------------------- Patient Info  ID #:       [id number]                          D.O.B.:  04/08/1990 (30 yrs)   [name]             Visit Date: 11/20/2020 09:49 am ---------------------------------------------------------------------- Performed By  Attending:        Tama High MD        Ref. Address:     [address]  [address] [place]                                                             [place]  Performed By:     Valda Favia          Location[facility]         [facility]                    [place]  Referred By:      [place] ---------------------------------------------------------------------- Orders  #  Description                           Code        Ordered By  1  [place] MFM FETAL BPP WO NON               76819.01    [place]  2  [place] MFM UA CORD DOPPLER                76820.02    [place] ----------------------------------------------------------------------  #  Order #                     Accession #                Episode #  1  [id number]                   [id number]                 [id number]  2  [id number]                   [id number]                 [id number] ---------------------------------------------------------------------- Indications  Maternal care for known or suspected poor      O36.5930  fetal growth, third trimester, not applicable or  unspecified IUGR  Hypertension - Chronic/Pre-existing            O10.019  (nifedipine and propranolol)  [redacted] weeks gestation of pregnancy                Z3A.31  Maternal morbid obesity (BMI 69)               O99.210 E66.01  Anemia during pregnancy in third trimester     O99.013  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)                   Z14.8  Insufficient FF on NIPS / Negative AFP ---------------------------------------------------------------------- Fetal Evaluation  Num Of Fetuses:  1  Fetal Heart  Rate(bpm):  143  Cardiac Activity:       Observed  Presentation:           Cephalic  Amniotic Fluid  AFI FV:      Subjectively decreased  AFI Sum(cm)     %Tile       Largest Pocket(cm)  7.7             < 3         2.5  RUQ(cm)       RLQ(cm)       LUQ(cm)        LLQ(cm)  2             1.8           2.5            1.4 ---------------------------------------------------------------------- Biophysical Evaluation  Amniotic F.V:   Pocket => 2 cm             F. Tone:        Observed  F. Movement:    Observed                   Score:          6/8  F. Breathing:   Not Observed ---------------------------------------------------------------------- OB History  Blood Type:   O-  Gravidity:    1 ---------------------------------------------------------------------- Gestational Age  LMP:           31w 4d        Date:  04/13/20                 EDD:   01/18/21  Best:          31w 4d     Det. By:  LMP  (04/13/20)          EDD:   01/18/21 ---------------------------------------------------------------------- Doppler - Fetal Vessels  Umbilical Artery                                                              ADFV    RDFV                                                                  Y       N ---------------------------------------------------------------------- Impression  Chronic hypertension.  Patient takes nifedipine, propranolol  and Lasix for control.  Severe fetal growth restriction.  Amniotic fluid is decreased for this gestational age but no  oligohydramnios is seen.  Fetal breathing movements did not  meet the criteria of BPP.  Umbilical artery Doppler showed  persistent absent end-diastolic flow.  No reversed end-  diastolic flow is seen.  BPP 6 out of 8.  I reviewed the NST that is reassuring.  I discussed with Dr. Elonda Husky. ---------------------------------------------------------------------- Recommendations  Repeat BPP tomorrow.  If BPP score is 6 out of 8 or less, I  recommend delivery.  -If NST is not reassuring,  delivery should be considered. ----------------------------------------------------------------------  Tama High, MD Electronically Signed Final Report   11/20/2020 05:55 pm ----------------------------------------------------------------------  [place] MFM UA CORD DOPPLER  Result Date: 11/19/2020 ----------------------------------------------------------------------  OBSTETRICS REPORT                       (Signed Final 11/19/2020 05:36 pm) ---------------------------------------------------------------------- Patient Info  ID #:       [id number]                          D.O.B.:  1990-09-07 (30 yrs)  [name]             Visit Date: 11/19/2020 09:28 am ---------------------------------------------------------------------- Performed By  Attending:        Johnell Comings MD         Ref. Address:     [place]                                                             [place]  Performed By:     Valda Favia          Location[facility]         [facility]                    [place]  Referred By:      [place] ---------------------------------------------------------------------- Orders  #  Description                           Code        Ordered By  1  [place] MFM UA CORD DOPPLER                76820.02    [place]  2  [place] MFM FETAL BPP WO NON               85462.70    [place]  STRESS ----------------------------------------------------------------------  #  Order #                     Accession #                Episode #  1  [id number]                   [id number]                 [id number]  2  [id number]                   [id number]                 [id number]  ---------------------------------------------------------------------- Indications  Maternal care for known or suspected poor      O36.5930  fetal growth, third trimester, not applicable or  unspecified IUGR  [redacted] weeks gestation of pregnancy                Z3A.31  Hypertension - Chronic/Pre-existing            O10.019  (nifedipine and propranolol)  Maternal morbid obesity (BMI 77)               O99.210 E66.01  Anemia during pregnancy in third trimester     O99.013  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)                   Z14.8  Insufficient FF on NIPS / Negative AFP ---------------------------------------------------------------------- Fetal Evaluation  Num Of Fetuses:         1  Fetal Heart Rate(bpm):  124  Cardiac Activity:       Observed  Presentation:           Breech  Amniotic Fluid  AFI FV:      Oligohydramnios  AFI Sum(cm)     %Tile       Largest Pocket(cm)  3.2             < 3         2.1  RUQ(cm)       RLQ(cm)       LUQ(cm)        LLQ(cm)  1.6           1.6           0              0 ---------------------------------------------------------------------- Biophysical Evaluation  Amniotic F.V:   Pocket => 2 cm             F. Tone:        Observed  F. Movement:    Observed                   Score:          6/8  F. Breathing:   Not Observed ---------------------------------------------------------------------- OB History  Blood Type:   O-  Gravidity:    1 ---------------------------------------------------------------------- Gestational Age  LMP:           31w 3d        Date:  04/13/20                 EDD:   01/18/21  Best:          Burke Keels 3d     Det. By:  LMP  (04/13/20)          EDD:   01/18/21 ---------------------------------------------------------------------- Doppler -  Fetal Vessels  Umbilical Artery                                                              ADFV    RDFV                                                                Yes      No ----------------------------------------------------------------------  Comments  This patient has been hospitalized due to IUGR with absent  end-diastolic flow and chronic hypertension.  A biophysical profile performed today was 6 out of 8.  She  received a -2 for fetal breathing movements that did not meet  criteria.  The amniotic fluid level remains low with a total AFI of 3.2  cm.  A greater than 2 cm vertical pocket of amniotic fluid is  present.  Doppler studies of the umbilical arteries performed today  continues to show absent end-diastolic flow.  There were no  signs of reversed end-diastolic flow noted today.  The patient should have an NST performed today.  She  should have another BPP repeated tomorrow.  Due to the decrease in amniotic fluid and the likelihood that  she will require delivery soon, a rescue course of steroids  should be given today.  I would recommend delivery should her NST be nonreactive  or should there be concerns regarding the fetal status. ----------------------------------------------------------------------                   Johnell Comings, MD Electronically Signed Final Report   11/19/2020 05:36 pm ----------------------------------------------------------------------    Medications:  Scheduled  aspirin  81 mg Oral Daily   docusate sodium  100 mg Oral Daily   hydrocortisone   Rectal BID   NIFEdipine  90 mg Oral Daily   prenatal multivitamin  1 tablet Oral Q1200   propranolol  80 mg Oral Q12H   sodium chloride flush  3 mL Intravenous Q12H   I have reviewed the patient's current medications.  ASSESSMENT: G1P0000 [redacted]w[redacted]d Estimated Date of Delivery: 01/18/21  FGR <1% with oligohydramnios and AEDF  Patient Active Problem List   Diagnosis Date Noted   Fetal growth restriction antepartum 11/18/2020   Pregnancy affected by fetal growth restriction 10/23/2020   BMI 70 and over, adult ([place]) 09/23/2020   Hypertension during pregnancy in second trimester 09/13/2020   Morbid obesity ([place]) 09/13/2020   PSVT (paroxysmal supraventricular  tachycardia) ([place]) 09/13/2020   Supervision of high risk pregnancy, antepartum 07/17/2020   Absolute anemia 11/07/2019   Vitamin B12 deficiency 07/04/2019   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   Anxiety and depression 08/28/2017   Chronic low back pain without sciatica 08/28/2017   History of anemia 08/28/2017   Essential hypertension 01/09/2017   Iron deficiency anemia due to chronic blood loss 01/09/2017   Class 3 severe obesity due to excess calories without serious comorbidity with body mass index (BMI) of 60.0 to 69.9 in adult ([place]) 01/09/2017    PLAN: no change in plan  1) CHTN - BP remains stable stable - Continue Procardia and Propranolol  at same dose - Continue ASA   2) IUGR - Fetal status currently stable - Repeat BPP today - NICU consult completed - Rescue dose of BMZ started yesterday; patient due to second dose today   Delivery for worsening maternal/fetal status Continue current antepartum care    Jessamine Barcia H Raymie Giammarco 11/21/2020,7:27 AM

## 2020-11-22 ENCOUNTER — Encounter (HOSPITAL_COMMUNITY): Payer: Self-pay | Admitting: Family Medicine

## 2020-11-22 LAB — CBC
HCT: 31.4 % — ABNORMAL LOW (ref 36.0–46.0)
Hemoglobin: 9.8 g/dL — ABNORMAL LOW (ref 12.0–15.0)
MCH: 24 pg — ABNORMAL LOW (ref 26.0–34.0)
MCHC: 31.2 g/dL (ref 30.0–36.0)
MCV: 76.8 fL — ABNORMAL LOW (ref 80.0–100.0)
Platelets: 430 10*3/uL — ABNORMAL HIGH (ref 150–400)
RBC: 4.09 MIL/uL (ref 3.87–5.11)
RDW: 16.4 % — ABNORMAL HIGH (ref 11.5–15.5)
WBC: 18.7 10*3/uL — ABNORMAL HIGH (ref 4.0–10.5)
nRBC: 0 % (ref 0.0–0.2)

## 2020-11-22 LAB — RPR: RPR Ser Ql: NONREACTIVE

## 2020-11-22 MED ORDER — LACTATED RINGERS IV SOLN: INTRAVENOUS | Status: DC

## 2020-11-22 MED ORDER — INFLUENZA VAC SPLIT QUAD 0.5 ML IM SUSY
0.5000 mL | PREFILLED_SYRINGE | INTRAMUSCULAR | Status: AC
  Administered 2020-11-23: 0.5 mL via INTRAMUSCULAR
  Filled 2020-11-22: qty 0.5

## 2020-11-22 MED ORDER — LISINOPRIL 10 MG PO TABS
5.0000 mg | ORAL_TABLET | Freq: Every day | ORAL | Status: DC
  Administered 2020-11-22 – 2020-11-25 (×4): 5 mg via ORAL
  Filled 2020-11-22 (×5): qty 1

## 2020-11-22 MED ORDER — FUROSEMIDE 40 MG PO TABS
20.0000 mg | ORAL_TABLET | Freq: Two times a day (BID) | ORAL | Status: DC
  Administered 2020-11-22 – 2020-11-25 (×7): 20 mg via ORAL
  Filled 2020-11-22 (×7): qty 1

## 2020-11-22 NOTE — Lactation Note (Signed)
This note was copied from a baby's chart. Lactation Consultation Note  Patient Name: Shelby Chandler GYFVC'B Date: 11/22/2020 Reason for consult: Initial assessment;Primapara;1st time breastfeeding;Infant < 6lbs;NICU baby;Preterm <34wks Age:31 hours  Visited with mom of 109 hours old pre-term NICU female, she's a P1 and reported (+) breast changes during the pregnancy. LC assisted mom with hand expression (colostrum noted) and pumping, she had her first pumping session during [facility] consultation; praised her for her efforts.  Reviewed pumping schedule, expectations, supply/demand, lactogenesis II and benefits of premature milk.  Plan of care:  Encouraged mom to start pumping every 3 hours, at least 8 pumping sessions/24 hours Breast massage, hand expression and coconut oil were also encouraged prior pumping  GOB (maternal) present and very supportive. All questions and concerns answered, family to call NICU LC PRN.  Maternal Data Has patient been taught Hand Expression?: Yes Does the patient have breastfeeding experience prior to this delivery?: No  Feeding Mother's Current Feeding Choice: Breast Milk  Lactation Tools Discussed/Used Tools: Pump;Flanges Flange Size: 24 Breast pump type: Double-Electric Breast Pump Pump Education: Setup, frequency, and cleaning;Milk Storage Pumping frequency: q 3 hours (recommended) Pumped volume:  (drops)  Interventions Interventions: Breast feeding basics reviewed;Breast massage;Hand express;Coconut oil;DEBP;"The NICU and Your Baby" book;LC Services brochure  Discharge Pump: DEBP;Personal (Spectra DEBP)  Consult Status Consult Status: Follow-up Date: 11/22/20 Follow-up type: In-patient   Shelby Chandler Shelby Chandler 11/22/2020, 11:52 AM

## 2020-11-22 NOTE — Progress Notes (Signed)
Subjective: Postpartum Day 1: Cesarean Delivery Patient reports incisional pain and tolerating PO.  Has not been out of bed.  Objective: Vital signs in last 24 hours: Vitals:   11/22/20 0421 11/22/20 0500 11/22/20 0600 11/22/20 0811  BP: (!) 142/97   (!) 153/94  Pulse: 70   (!) 56  Resp:  20 16 18   Temp:    97.8 F (36.6 C)  TempSrc:    Oral  SpO2:    99%  Weight:      Height:        Intake/Output Summary (Last 24 hours) at 11/22/2020 1035 Last data filed at 11/22/2020 0900 Gross per 24 hour  Intake 2086.62 ml  Output 2706 ml  Net -619.38 ml    Physical Exam:  General: alert, cooperative, and appears stated age Lochia: appropriate Uterine Fundus: firm Incision: no significant drainage DVT Evaluation: No evidence of DVT seen on physical exam.  Recent Labs    11/21/20 1340 11/22/20 0432  HGB 11.1* 9.8*  HCT 35.1* 31.4*    Assessment/Plan: Status post Cesarean section. Doing well postoperatively.  Continue Magnesium x 24 hours Minimal diuresis BP is still elevated on Procardia and Propranolol Add lisinopril Add lasix Lactation consultant Baby is doing well in the NICU Continue current care. Discussed implications of primary classical c-section on future fertility.  Donnamae Jude 11/22/2020, 10:34 AM
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
## 2020-11-23 NOTE — Lactation Note (Signed)
This note was copied from a baby's chart.  NICU Lactation Consultation Note  Patient Name: Shelby Chandler WIOMB'T Date: 11/23/2020 Age:31 hours   Subjective Reason for consult: Follow-up assessment Mom is pumping without difficulty and seeing drops of colostrum.  Objective Infant data: Mother's Current Feeding Choice: Breast Milk and Donor Milk  Maternal data: G1P0000  C-Section, Low Transverse  Does the patient have breastfeeding experience prior to this delivery?: No   Pumping frequency: q 3 hours (recommended) Pumped volume: 4 mL Flange Size: 24 (may need 27 on R)  Pump: DEBP; Personal (Spectra DEBP)   Assessment Infant: Feeding Status: NPO   Maternal: Milk volume: Normal Mother is pumping without difficulty.   Intervention/Plan Interventions: Education; Infant Driven Feeding Algorithm education  Tools: Pump; Hands-free pumping top  Plan: Consult Status: Follow-up  NICU Follow-up type: New admission follow up; Verify absence of engorgement; Verify onset of copious milk Mother to continue pumping q3 and call out for [facility] follow up today prn. Will plan f/u to assist with bf when baby is ready.   Gwynne Edinger 11/23/2020, 10:00 AM

## 2020-11-24 LAB — SURGICAL PATHOLOGY

## 2020-11-24 MED ORDER — IBUPROFEN 600 MG PO TABS
600.0000 mg | ORAL_TABLET | Freq: Four times a day (QID) | ORAL | Status: DC
  Administered 2020-11-24 – 2020-11-25 (×3): 600 mg via ORAL
  Filled 2020-11-24 (×4): qty 1

## 2020-11-24 MED ORDER — GABAPENTIN 300 MG PO CAPS
300.0000 mg | ORAL_CAPSULE | Freq: Three times a day (TID) | ORAL | Status: DC
  Administered 2020-11-24 – 2020-11-25 (×2): 300 mg via ORAL
  Filled 2020-11-24 (×3): qty 1

## 2020-11-24 NOTE — Lactation Note (Signed)
This note was copied from a baby's chart. Lactation Consultation Note  Patient Name: Shelby Chandler EQAST'M Date: 11/24/2020 Reason for consult: Follow-up assessment;NICU baby;1st time breastfeeding;Primapara;Preterm <34wks Age:31 hours  Lactation conducted routine new patient follow up. Ms. Ostroff requested that we observe her pump due to some issues with leaking flanges while pumping.  I helped to place her belly band and observed her pump one full session. I educated at the bedside about her transitioning milk, and I encouraged her to occasionally lean over to allow the milk to drain into her containers. I checked her flanges. She is using a 24 on one breast and a 27 on the other. She reports it's comfortable.   I noted her pump parts were soaking in soapy water, and I discouraged this practice.   We noted that she needed to manually increase the contact of her right flange because she was occassionally losing suction on this side. The belly band was not providing enough snug contact.  Baby is "[employer] and mom's goal is to breast feed. I provided encouragement and answered questions. I encouraged her to switch to the expression setting once she begins to collect 20 mls consistently (3 times or more in a row).  Maternal Data Has patient been taught Hand Expression?: Yes Does the patient have breastfeeding experience prior to this delivery?: No  Feeding Mother's Current Feeding Choice: Breast Milk   Lactation Tools Discussed/Used Tools: Pump;Flanges Flange Size: 24;27 Breast pump type: Double-Electric Breast Pump Pump Education: Setup, frequency, and cleaning;Milk Storage Reason for Pumping: NICU Pumping frequency: q3 hours Pumped volume: 4 mL  Interventions Interventions: Breast feeding basics reviewed;DEBP;Education  Discharge Pump: DEBP;Personal (Spectra)  Consult Status Consult Status: Follow-up Date: 11/24/20 Follow-up type: In-patient    Shelby Chandler 11/24/2020, 12:34 PM
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
## 2020-11-24 NOTE — Progress Notes (Addendum)
Subjective: Postpartum Day 2: Cesarean Delivery Patient reports tolerating PO, + flatus, + BM, and no problems voiding.    Objective: Vital signs in last 24 hours: Temp:  [97.8 F (36.6 C)-98.3 F (36.8 C)] 98.3 F (36.8 C) (10/18 0755) Pulse Rate:  [67-84] 72 (10/18 0755) Resp:  [17-18] 18 (10/18 0755) BP: (121-151)/(60-87) 136/64 (10/18 0755) SpO2:  [98 %-100 %] 98 % (10/18 0755)  Physical Exam:  General: alert, cooperative, no distress, and morbidly obese Lochia: appropriate Uterine Fundus: firm Incision: Prevena in place DVT Evaluation: No evidence of DVT seen on physical exam.  Recent Labs    11/21/20 1340 11/22/20 0432  HGB 11.1* 9.8*  HCT 35.1* 31.4*    Assessment/Plan: Status post Cesarean section. Doing well postoperatively.  POD2 s/p 1LTCS due to NRFHT in s/o FGR <1%ile with AEDF at 32w - Postop hemoglobin is appropriate - Will d/c home with PO iron on POD3 - Breast pumping - Baby is NICU doing well overall - Undecided about birth control - Rh postive - Rubella immune  2. CHTN with SIPE - s/p Mag - Blood pressure controlled with: Procardia 90 daily, Inderal 80 BID, Lasix BID, and Lisinopril 5 daily   Radene Gunning 11/24/2020, 11:44 AM

## 2020-11-25 ENCOUNTER — Other Ambulatory Visit (HOSPITAL_COMMUNITY): Payer: Self-pay

## 2020-11-25 ENCOUNTER — Ambulatory Visit: Payer: Managed Care, Other (non HMO)

## 2020-11-25 ENCOUNTER — Ambulatory Visit: Payer: Self-pay

## 2020-11-25 MED ORDER — FUROSEMIDE 20 MG PO TABS
20.0000 mg | ORAL_TABLET | Freq: Two times a day (BID) | ORAL | 0 refills | Status: DC
  Filled 2020-11-25: qty 30, 15d supply, fill #0

## 2020-11-25 MED ORDER — SIMETHICONE 80 MG PO CHEW
80.0000 mg | CHEWABLE_TABLET | ORAL | 0 refills | Status: DC | PRN
  Filled 2020-11-25: qty 30, 15d supply, fill #0

## 2020-11-25 MED ORDER — ACETAMINOPHEN 500 MG PO TABS
1000.0000 mg | ORAL_TABLET | Freq: Four times a day (QID) | ORAL | 0 refills | Status: DC
  Filled 2020-11-25: qty 30, 4d supply, fill #0

## 2020-11-25 MED ORDER — LISINOPRIL 5 MG PO TABS
5.0000 mg | ORAL_TABLET | Freq: Every day | ORAL | 1 refills | Status: DC
  Filled 2020-11-25: qty 30, 30d supply, fill #0

## 2020-11-25 MED ORDER — IBUPROFEN 600 MG PO TABS
600.0000 mg | ORAL_TABLET | Freq: Four times a day (QID) | ORAL | 0 refills | Status: DC
  Filled 2020-11-25: qty 30, 8d supply, fill #0

## 2020-11-25 MED ORDER — PROPRANOLOL HCL 40 MG PO TABS
80.0000 mg | ORAL_TABLET | Freq: Two times a day (BID) | ORAL | 1 refills | Status: DC
  Filled 2020-11-25: qty 90, 23d supply, fill #0

## 2020-11-25 MED ORDER — NIFEDIPINE ER 90 MG PO TB24
90.0000 mg | ORAL_TABLET | Freq: Every day | ORAL | 1 refills | Status: DC
  Filled 2020-11-25: qty 30, 30d supply, fill #0

## 2020-11-25 NOTE — Plan of Care (Signed)
  Problem: Education: Goal: Knowledge of disease or condition will improve Outcome: Adequate for Discharge Goal: Knowledge of the prescribed therapeutic regimen will improve Outcome: Adequate for Discharge Goal: Individualized Educational Video(s) Outcome: Adequate for Discharge   Problem: Clinical Measurements: Goal: Complications related to the disease process, condition or treatment will be avoided or minimized Outcome: Adequate for Discharge   Problem: Health Behavior/Discharge Planning: Goal: Ability to manage health-related needs will improve Outcome: Adequate for Discharge   Problem: Clinical Measurements: Goal: Ability to maintain clinical measurements within normal limits will improve Outcome: Adequate for Discharge Goal: Will remain free from infection Outcome: Adequate for Discharge Goal: Diagnostic test results will improve Outcome: Adequate for Discharge Goal: Respiratory complications will improve Outcome: Adequate for Discharge Goal: Cardiovascular complication will be avoided Outcome: Adequate for Discharge   Problem: Activity: Goal: Risk for activity intolerance will decrease Outcome: Adequate for Discharge   Problem: Nutrition: Goal: Adequate nutrition will be maintained Outcome: Adequate for Discharge   Problem: Coping: Goal: Level of anxiety will decrease Outcome: Adequate for Discharge   Problem: Elimination: Goal: Will not experience complications related to bowel motility Outcome: Adequate for Discharge Goal: Will not experience complications related to urinary retention Outcome: Adequate for Discharge   Problem: Pain Managment: Goal: General experience of comfort will improve Outcome: Adequate for Discharge   Problem: Safety: Goal: Ability to remain free from injury will improve Outcome: Adequate for Discharge   Problem: Skin Integrity: Goal: Risk for impaired skin integrity will decrease Outcome: Adequate for Discharge   Problem:  Education: Goal: Knowledge of condition will improve Outcome: Adequate for Discharge Goal: Individualized Educational Video(s) Outcome: Adequate for Discharge Goal: Individualized Newborn Educational Video(s) Outcome: Adequate for Discharge   Problem: Activity: Goal: Will verbalize the importance of balancing activity with adequate rest periods Outcome: Adequate for Discharge Goal: Ability to tolerate increased activity will improve Outcome: Adequate for Discharge   Problem: Coping: Goal: Ability to identify and utilize available resources and services will improve Outcome: Adequate for Discharge   Problem: Life Cycle: Goal: Chance of risk for complications during the postpartum period will decrease Outcome: Adequate for Discharge   Problem: Role Relationship: Goal: Ability to demonstrate positive interaction with newborn will improve Outcome: Adequate for Discharge   Problem: Skin Integrity: Goal: Demonstration of wound healing without infection will improve Outcome: Adequate for Discharge   Problem: Education: Goal: Knowledge of disease or condition will improve Outcome: Adequate for Discharge Goal: Knowledge of the prescribed therapeutic regimen will improve Outcome: Adequate for Discharge   Problem: Fluid Volume: Goal: Peripheral tissue perfusion will improve Outcome: Adequate for Discharge   Problem: Clinical Measurements: Goal: Complications related to disease process, condition or treatment will be avoided or minimized Outcome: Adequate for Discharge

## 2020-11-25 NOTE — Lactation Note (Signed)
This note was copied from a baby's chart. Lactation Consultation Note  Patient Name: Shelby Chandler FVOHK'G Date: 11/25/2020 Reason for consult: NICU baby;Follow-up assessment;Preterm <34wks;Infant < 6lbs;1st time breastfeeding;Primapara Age:31 days  Visited with mom of 79 67/68 weeks old (adjusted) NICU female, she's a P1 and reporting that her supply continues to increase. Mom is getting discharge today, reviewed discharge instructions, sore nipples and engorgement prevention/treatment.  Per mom pumping is going well, she's aware to switch to expression setting once her milk comes in. Reviewed pumping schedule, supply/demand and lactogenesis II/III.  Plan of care:   Encouraged mom to start pumping every 3 hours, at least 8 pumping sessions/24 hours Breast massage, hand expression and coconut oil were also encouraged prior pumping   GOB (maternal) present and very supportive. All questions and concerns answered, family to call NICU LC PRN.  Maternal Data   Mom's supply is coming in and is WNL  Feeding Mother's Current Feeding Choice: Breast Milk  Lactation Tools Discussed/Used Tools: Pump;Flanges Flange Size: 24;27 Breast pump type: Double-Electric Breast Pump Pump Education: Setup, frequency, and cleaning;Milk Storage Reason for Pumping: pre-term infant in NICU Pumping frequency: 8 times/24 hours Pumped volume: 15 mL  Interventions Interventions: DEBP;Education;Breast feeding basics reviewed  Discharge Discharge Education: Engorgement and breast care Pump: DEBP;Personal (Spectra S2)  Consult Status Consult Status: Follow-up Date: 11/25/20 Follow-up type: In-patient   Inocente Krach [name] 11/25/2020, 5:39 PM

## 2020-11-27 ENCOUNTER — Ambulatory Visit: Payer: Self-pay

## 2020-11-27 NOTE — Lactation Note (Addendum)
This note was copied from a baby's chart.  NICU Lactation Consultation Note  Patient Name: Shelby Chandler XIPJA'S Date: 11/27/2020 Age:31 days   Subjective Reason for consult: NICU baby; Follow-up assessment Mother continues to pump frequently. She denies pain or engorgement.  Objective Infant data: Mother's Current Feeding Choice: Breast Milk  Infant feeding assessment  Maternal data: G1P0000  C-Section, Low Transverse Pumping frequency: q3 Pumped volume: 10 mL Spectra pump for at-home use Assessment Infant:  Maternal: Milk volume: Low Mother may have a delay in lactogenesis II  Intervention/Plan Interventions: Education; Breast massage   Plan: Consult Status: Follow-up  NICU Follow-up type: Weekly NICU follow up Mother to continue pumping q3 LC will plan f/u to assess milk volume  Gwynne Edinger 11/27/2020, 11:59 AM

## 2020-11-30 ENCOUNTER — Ambulatory Visit: Payer: Self-pay

## 2020-11-30 ENCOUNTER — Ambulatory Visit: Payer: Managed Care, Other (non HMO) | Admitting: Cardiology

## 2020-11-30 ENCOUNTER — Other Ambulatory Visit: Payer: Self-pay

## 2020-11-30 ENCOUNTER — Encounter: Payer: Self-pay | Admitting: Cardiology

## 2020-11-30 VITALS — BP 132/68 | HR 73 | Ht 65.0 in | Wt >= 6400 oz

## 2020-11-30 DIAGNOSIS — I10 Essential (primary) hypertension: Secondary | ICD-10-CM | POA: Diagnosis not present

## 2020-11-30 DIAGNOSIS — I471 Supraventricular tachycardia: Secondary | ICD-10-CM | POA: Diagnosis not present

## 2020-11-30 NOTE — Lactation Note (Signed)
This note was copied from a baby's chart.  NICU Lactation Consultation Note  Patient Name: Shelby Chandler WSFKC'L Date: 11/30/2020 Age:31 days   Subjective Reason for consult: Mother's request Mother is concerned because she is pumping less on R side compared to L. She pumps frequently and with a high vacuum setting. She denies pain with pumping. Her flange size is ok.  Objective Infant data: Mother's Current Feeding Choice: Breast Milk  Infant feeding assessment Scale for Readiness: 2   Maternal data: G1P0000  C-Section, Low Transverse No data recorded Current breast feeding challenges:: low yield on R side  Pumping frequency: 30-48mL q 3 hours 20mm flanges  Assessment Maternal: Mother may benefit from pumping one breast at a time.   Intervention/Plan Interventions: Education 58mm flange provided.  Plan: Consult Status: Follow-up  NICU Follow-up type: Weekly NICU follow up  Mother to pump one side at a time while working on increasing R side yield. Mother to try 21mm flange on R side. Mother to continue frequent pumping.   Gwynne Edinger 11/30/2020, 9:42 AM

## 2020-11-30 NOTE — Patient Instructions (Signed)
Medication Instructions:  Your physician recommends that you continue on your current medications as directed. Please refer to the Current Medication list given to you today.  *If you need a refill on your cardiac medications before your next appointment, please call your pharmacy*   Lab Work: None If you have labs (blood work) drawn today and your tests are completely normal, you will receive your results only by: [facility] [place] (if you have MyChart) OR A paper copy in the mail If you have any lab test that is abnormal or we need to change your treatment, we will call you to review the results.   Testing/Procedures: None   Follow-Up: At [facility], you and your health needs are our priority.  As part of our continuing mission to provide you with exceptional heart care, we have created designated Provider Care Teams.  These Care Teams include your primary Cardiologist (physician) and Advanced Practice Providers (APPs -  Physician Assistants and Nurse Practitioners) who all work together to provide you with the care you need, when you need it.  We recommend signing up for the patient portal called "MyChart".  Sign up information is provided on this After Visit Summary.  MyChart is used to connect with patients for Virtual Visits (Telemedicine).  Patients are able to view lab/test results, encounter notes, upcoming appointments, etc.  Non-urgent messages can be sent to your provider as well.   To learn more about what you can do with MyChart, go to [email or web address].    Your next appointment:   12 week(s)  The format for your next appointment:   In Person  Provider:   Berniece Salines, DO [id number], [place], [place] [address]    Other Instructions

## 2020-11-30 NOTE — Progress Notes (Signed)
Cardio-Obstetrics Clinic  Follow Up Note   Date:  11/30/2020   ID:  Shelby Chandler, DOB 06-04-1990, MRN [id number]  PCP:  Billie Ruddy, MD   [facility] Providers Cardiologist:  Berniece Salines, DO  Electrophysiologist:  None        Referring MD: Billie Ruddy, MD   Chief Complaint:   History of Present Illness:    Shelby Chandler is a 30 y.o. female [[id number]] who returns for follow up of postpartum.  Follow the patient during pregnancy for hypertension in pregnancy.  I did see the patient on August 14, 2020 at that time she had been experiencing intermittent palpitations along with hypertension.  She was on Procardia with labetalol prior to that visit.  But before I saw the patient in July 2022 she has stopped the labetalol as she reported this was giving her significant palpitations.  At that visit I started the patient on propanolol.  She had significant improvement with her palpitations with her propanolol.  She was seen on September 11, 2020 at that time I increase her propranolol to 80 mg twice a day due to her work schedule and continue her on her Procardia.  Since she is delivered she had some hypertension she has been placed on lisinopril 5 mg daily as well.  Her blood pressure has been controlled she tells me.  She is a bit nervous because her baby was born premature and is currently in the NICU.  But she sees her every day.   Prior CV Studies Reviewed: The following studies were reviewed today:  Zio monitor 2022   Patch Wear Time:  9 days and 1 hours starting August 14, 2020 Indication: Palpitations   Patient had a min HR of 30 bpm, max HR of 158 bpm, and avg HR of 82 bpm. Predominant underlying rhythm was Sinus Rhythm. S   3 Supraventricular Tachycardia runs occurred, the run with the fastest interval lasting 4 beats with a max rate of 158 bpm, the longest lasting 15 beats with an avg rate of 126 bpm. Second Degree AV Block-Mobitz I (Wenckebach) was  present. Isolated SVEs were rare (<1.0%), SVE Couplets were rare (<1.0%), and no SVE Triplets were present.  Isolated VEs were rare (<1.0%), VE Couplets were rare (<1.0%), and no VE Triplets were present.   No ventricular tachycardia, no type II second-degree AV block or third-degree AV block and no atrial fibrillation.   Conclusion: Rare short runs of paroxysmal SVT.  There is one episode of Mobitz type I AV block which occurred in the early morning likely while patient sleeping.  May benefit from sleep apnea testing postpartum.  Past Medical History:  Diagnosis Date   Anemia    Anxiety    Hypertension     Past Surgical History:  Procedure Laterality Date   CESAREAN SECTION  11/21/2020   Procedure: CESAREAN SECTION;  Surgeon: Donnamae Jude, MD;  Location: [facility] LD ORS;  Service: Obstetrics;;   TOOTH EXTRACTION        OB History     Gravida  1   Para  0   Term  0   Preterm      AB  0   Living         SAB  0   IAB  0   Ectopic  0   Multiple  0   Live Births  Current Medications: Current Meds  Medication Sig   acetaminophen (TYLENOL) 500 MG tablet Take 2 tablets (1,000 mg total) by mouth every 6 (six) hours.   Ferrous Sulfate 220 (44 Fe) MG/5ML SOLN Take 5 mLs by mouth every other day.   furosemide (LASIX) 20 MG tablet Take 1 tablet (20 mg total) by mouth 2 (two) times daily.   ibuprofen (ADVIL) 600 MG tablet Take 1 tablet (600 mg total) by mouth every 6 (six) hours.   lisinopril (ZESTRIL) 5 MG tablet Take 1 tablet (5 mg total) by mouth daily.   NIFEdipine (ADALAT CC) 90 MG 24 hr tablet Take 1 tablet (90 mg total) by mouth daily.   Prenatal MV-Min-Fe Fum-FA-DHA (PRENATAL 1 PO) Take 1 tablet by mouth daily. Gummies/Unknown strenght   propranolol (INDERAL) 40 MG tablet Take 2 tablets (80 mg total) by mouth every 12 (twelve) hours.   simethicone (MYLICON) 80 MG chewable tablet Chew and swallow 1 tablet (80 mg total) by mouth as needed for  flatulence.     Allergies:   Patient has no known allergies.   Social History   Socioeconomic History   Marital status: Single    Spouse name: Not on file   Number of children: Not on file   Years of education: Not on file   Highest education level: Not on file  Occupational History   Not on file  Tobacco Use   Smoking status: Never   Smokeless tobacco: Never  Vaping Use   Vaping Use: Never used  Substance and Sexual Activity   Alcohol use: Not Currently   Drug use: No   Sexual activity: Not Currently    Birth control/protection: None  Other Topics Concern   Not on file  Social History Narrative   Not on file   Social Determinants of Health   Financial Resource Strain: Low Risk    Difficulty of Paying Living Expenses: Not hard at all  Food Insecurity: No Food Insecurity   Worried About Charity fundraiser in the Last Year: Never true   [place] in the Last Year: Never true  Transportation Needs: No Transportation Needs   Lack of Transportation (Medical): No   Lack of Transportation (Non-Medical): No  Physical Activity: Not on file  Stress: Stress Concern Present   Feeling of Stress : To some extent  Social Connections: Unknown   Frequency of Communication with Friends and Family: More than three times a week   Frequency of Social Gatherings with Friends and Family: Not on file   Attends Religious Services: Not on Electrical engineer or Organizations: Not on file   Attends Archivist Meetings: Not on file   Marital Status: Not on file      Family History  Problem Relation Age of Onset   Alcohol abuse Mother    Arthritis Mother    Depression Mother    Drug abuse Mother    Heart attack Mother    Hypertension Mother    Hyperlipidemia Mother    Alcohol abuse Father    Diabetes Father    Drug abuse Father    Hypertension Father    Heart attack Father    Stroke Father    Hypertension Sister    Arthritis Maternal Grandmother     Arthritis Maternal Grandfather    Diabetes Paternal Grandmother       ROS:   Please see the history of present illness.     All  other systems reviewed and are negative.   Labs/EKG Reviewed:    EKG:   EKG is was not ordered today.    Recent Labs: 11/21/2020: ALT 14; BUN 14; Creatinine, Ser 0.81; Potassium 4.3; Sodium 136 11/22/2020: Hemoglobin 9.8; Platelets 430   Recent Lipid Panel Lab Results  Component Value Date/Time   CHOL 174 01/09/2017 12:01 PM   TRIG 72 01/09/2017 12:01 PM   HDL 51 01/09/2017 12:01 PM   CHOLHDL 3.4 01/09/2017 12:01 PM   LDLCALC 109 (H) 01/09/2017 12:01 PM    Physical Exam:    VS:  BP 132/68   Pulse 73   Ht 5\' 5"  (1.651 m)   Wt (!) 435 lb 9.6 oz (197.6 kg)   LMP 04/13/2020 (Exact Date)   SpO2 99%   BMI 72.49 kg/m     Wt Readings from Last 3 Encounters:  11/30/20 (!) 435 lb 9.6 oz (197.6 kg)  11/06/20 (!) 440 lb (199.6 kg)  11/04/20 (!) 437 lb (198.2 kg)     GEN:  Well nourished, well developed in no acute distress HEENT: Normal NECK: No JVD; No carotid bruits LYMPHATICS: No lymphadenopathy CARDIAC: RRR, no murmurs, rubs, gallops RESPIRATORY:  Clear to auscultation without rales, wheezing or rhonchi  ABDOMEN: Soft, non-tender, non-distended MUSCULOSKELETAL:+bilateral leg edema; No deformity  SKIN: Warm and dry NEUROLOGIC:  Alert and oriented x 3 PSYCHIATRIC:  Normal affect    Risk Assessment/Risk Calculators:                 ASSESSMENT & PLAN:    Chronic hypertension Morbid obesity Rare paroxysmal SVT  Her blood pressure is acceptable in the office today.  No changes will be made to her medication regimen. Advised the patient I will keep her on the same regimen for now.  She did undergo C-section on November 21, 2020 and is still within 12 weeks of postpartum.  She will take her blood pressure daily for me and if there is any systolic greater than 373 she will send that information for [place] to discuss adjustment of her  antihypertensive medication.  The patient understands the need to lose weight with diet and exercise. We have discussed specific strategies for this.  She is having some hard time with her daughter being in the NICU but she is working through it.  She seems happy and I am very happy for her.  Still contemplating when she goes back to work postpartum.  She has daycare range as well.  She continues to follow with her therapist.  She is not experiencing any symptoms of postpartum depression.    Patient Instructions  Medication Instructions:  Your physician recommends that you continue on your current medications as directed. Please refer to the Current Medication list given to you today.  *If you need a refill on your cardiac medications before your next appointment, please call your pharmacy*   Lab Work: None If you have labs (blood work) drawn today and your tests are completely normal, you will receive your results only by: [place] (if you have MyChart) OR A paper copy in the mail If you have any lab test that is abnormal or we need to change your treatment, we will call you to review the results.   Testing/Procedures: None   Follow-Up: At [facility], you and your health needs are our priority.  As part of our continuing mission to provide you with exceptional heart care, we have created designated Provider Care Teams.  These Care Teams  include your primary Cardiologist (physician) and Advanced Practice Providers (APPs -  Physician Assistants and Nurse Practitioners) who all work together to provide you with the care you need, when you need it.  We recommend signing up for the patient portal called "MyChart".  Sign up information is provided on this After Visit Summary.  MyChart is used to connect with patients for Virtual Visits (Telemedicine).  Patients are able to view lab/test results, encounter notes, upcoming appointments, etc.  Non-urgent messages can be sent to your  provider as well.   To learn more about what you can do with MyChart, go to [email or web address].    Your next appointment:   12 week(s)  The format for your next appointment:   In Person  Provider:   Berniece Salines, DO [address], [place], [place] [address]    Other Instructions     Dispo:  No follow-ups on file.   Medication Adjustments/Labs and Tests Ordered: Current medicines are reviewed at length with the patient today.  Concerns regarding medicines are outlined above.  Tests Ordered: No orders of the defined types were placed in this encounter.  Medication Changes: No orders of the defined types were placed in this encounter.

## 2020-12-01 ENCOUNTER — Ambulatory Visit: Payer: Managed Care, Other (non HMO)

## 2020-12-01 ENCOUNTER — Encounter: Payer: Self-pay | Admitting: Obstetrics & Gynecology

## 2020-12-01 ENCOUNTER — Ambulatory Visit (INDEPENDENT_AMBULATORY_CARE_PROVIDER_SITE_OTHER): Payer: Managed Care, Other (non HMO) | Admitting: Obstetrics & Gynecology

## 2020-12-01 VITALS — BP 132/75 | HR 76 | Ht 65.0 in | Wt >= 6400 oz

## 2020-12-01 DIAGNOSIS — Z6841 Body Mass Index (BMI) 40.0 and over, adult: Secondary | ICD-10-CM

## 2020-12-01 DIAGNOSIS — O10012 Pre-existing essential hypertension complicating pregnancy, second trimester: Secondary | ICD-10-CM

## 2020-12-01 DIAGNOSIS — O1003 Pre-existing essential hypertension complicating the puerperium: Secondary | ICD-10-CM

## 2020-12-01 MED ORDER — LISINOPRIL 10 MG PO TABS: 10.0000 mg | ORAL_TABLET | Freq: Every day | ORAL | 0 refills | Status: DC

## 2020-12-01 MED ORDER — FUROSEMIDE 20 MG PO TABS: 20.0000 mg | ORAL_TABLET | Freq: Two times a day (BID) | ORAL | 0 refills | Status: DC

## 2020-12-01 NOTE — Progress Notes (Signed)
Subjective:     Shelby Chandler is a 30 y.o. female who presents to the clinic 10 days status post  cesarean section with a wound vac in place. Eating a regular diet without difficulty. Bowel movements are normal. Pain is controlled with current analgesics. Medications being used: narcotic analgesics including oxycodone/acetaminophen (Percocet, Tylox).  The following portions of the patient's history were reviewed and updated as appropriate: allergies, current medications, past family history, past medical history, past social history, past surgical history, and problem list. Lower leg edema Review of Systems LE edema    Objective:    BP 132/75   Pulse 76   Ht 5\' 5"  (1.651 m)   Wt (!) 433 lb 9.6 oz (196.7 kg)   LMP 04/13/2020 (Exact Date)   Breastfeeding Unknown   BMI 72.15 kg/m  General:  alert, cooperative, and no distress  Abdomen: soft, bowel sounds active, non-tender, large pannus  Incision:   healing well, no drainage, no erythema, no hernia, no seroma, no swelling, no dehiscence, incision well approximated    Prevena dressing removed and cleaned site  Assessment:    Postoperative course complicated by edema Operative findings again reviewed. Pathology report discussed.    Plan:    1. Change current medications, d/c Procardia and increase ACE. 2. Wound care discussed. 3. Activity restrictions: no lifting more than 15 pounds 4. Anticipated return to work: not applicable. 5. Follow up: 3 weeks for suture removal. Patient ID: Shelby Chandler, female   DOB: August 28, 1990, 30 y.o.   MRN: [id number]

## 2020-12-01 NOTE — Progress Notes (Signed)
Patient presents for a postpartum incision check and blood pressure check. Patient is 10 days postpartum. Patient is having significant edema in her legs and feet.   Patient Prevena plus fell off on Sunday, but bandage is still in place. She states that there is some odor, but denies having any pain or tenderness.

## 2020-12-04 ENCOUNTER — Telehealth (HOSPITAL_COMMUNITY): Payer: Self-pay | Admitting: *Deleted

## 2020-12-04 NOTE — Telephone Encounter (Signed)
Mom reports feeling good. Had OB appt on 12-01-20 and reports incision healing well. Mom has no concerns about herself right now. EPDS=3 at [place] (hospital score=6) Mom reports baby is still in the NICU, but doing well.  Odis Hollingshead, RN 12-04-2020 at 9:50am

## 2020-12-12 ENCOUNTER — Ambulatory Visit: Payer: Self-pay

## 2020-12-12 NOTE — Lactation Note (Signed)
This note was copied from a baby's chart. Lactation Consultation Note  Patient Name: Shelby Chandler JJHER'D Date: 12/12/2020 Reason for consult: Follow-up assessment;Primapara;1st time breastfeeding;Other (Comment) (SGA, IUGR) Age:30 yr.o.  Visited with mom of 31 99/31 weeks old (adjusted) NICU female, she's a P1 and has been experiencing issues with the hospital grade pump in baby's room in 308. This LC switched her pump and also her tubing (one piece has some milk in it) and mom voiced it's working much better now, she'll start doing bilateral pumping from now on.  Reviewed pumping schedule, lactogenesis III and supply/demand. Mom also voiced new size flanges # 30 are working out well for her.  Plan of care:   Encouraged mom to start pumping bilaterally with new NICU pump every 3 hours, at least 8 pumping sessions/24 hours Breast massage, hand expression were also encouraged prior/during pumping especially on her right side   No other support person at this time. All questions and concerns answered, family to call NICU LC PRN.   Maternal Data  Mom's supply has slightly increased but it's still BNL  Feeding Mother's Current Feeding Choice: Breast Milk  Lactation Tools Discussed/Used Tools: Pump;Flanges Flange Size: 30 Breast pump type: Double-Electric Breast Pump Pump Education: Setup, frequency, and cleaning;Milk Storage Reason for Pumping: LPI in NICU Pumping frequency: 6 times/24 hours due to mom having to pump only one breast at a time (previous LC advised on it due to pump power/suction) Pumped volume: 60 mL  Interventions Interventions: Breast feeding basics reviewed;DEBP;Education  Discharge Pump: DEBP;Personal (Spectra S2 at home)  Consult Status Consult Status: Follow-up Date: 12/12/20 Follow-up type: In-patient   Yarisbel Miranda [name] 12/12/2020, 2:37 PM

## 2020-12-14 ENCOUNTER — Ambulatory Visit: Payer: Self-pay

## 2020-12-14 NOTE — Lactation Note (Signed)
This note was copied from a baby's chart.  NICU Lactation Consultation Note  Patient Name: Girl Saydee Zolman ODQVH'Q Date: 12/14/2020 Age:31 wk.o.   Subjective Reason for consult: Weekly NICU follow-up Mother's milk supply is increasing. She continues to pump frequently and without difficulty.   Objective Infant data: Mother's Current Feeding Choice: Breast Milk  Infant feeding assessment Scale for Readiness: 3  Maternal data: G1P0101  C-Section, Low Transverse  Pumping frequency: q3 Pumped volume: 100 mL  Assessment Maternal: Mother's overall milk volume has been low but increasing. She is trending to a normal milk volume this week.  Milk volume: Normal  Intervention/Plan Interventions: Tour manager education; Education  Plan: Consult Status: Follow-up Mother to continue pumping q3 Will plan to assist with bf'ing when baby is ready NICU Follow-up type: Weekly NICU follow up   Gwynne Edinger 12/14/2020, 12:07 PM

## 2020-12-17 ENCOUNTER — Other Ambulatory Visit: Payer: Self-pay | Admitting: Obstetrics & Gynecology

## 2020-12-17 DIAGNOSIS — O10012 Pre-existing essential hypertension complicating pregnancy, second trimester: Secondary | ICD-10-CM

## 2020-12-18 ENCOUNTER — Other Ambulatory Visit: Payer: Self-pay | Admitting: Obstetrics & Gynecology

## 2020-12-18 DIAGNOSIS — O10012 Pre-existing essential hypertension complicating pregnancy, second trimester: Secondary | ICD-10-CM

## 2020-12-24 ENCOUNTER — Other Ambulatory Visit: Payer: Self-pay

## 2020-12-24 ENCOUNTER — Ambulatory Visit (INDEPENDENT_AMBULATORY_CARE_PROVIDER_SITE_OTHER): Payer: Managed Care, Other (non HMO) | Admitting: Obstetrics and Gynecology

## 2020-12-24 ENCOUNTER — Encounter: Payer: Self-pay | Admitting: Obstetrics and Gynecology

## 2020-12-24 DIAGNOSIS — I10 Essential (primary) hypertension: Secondary | ICD-10-CM

## 2020-12-24 NOTE — Patient Instructions (Signed)

## 2020-12-24 NOTE — Progress Notes (Signed)
Reports heavy menses X 5 days. First menses since delivery.

## 2020-12-24 NOTE — Progress Notes (Signed)
    [place] Partum Visit Note  Shelby Chandler is a 30 y.o. G43P0101 female who presents for a postpartum visit. She is 8 weeks postpartum following a primary cesarean section.  I have fully reviewed the prenatal and intrapartum course. The delivery was at 31.5 gestational weeks.  Anesthesia: spinal. Postpartum course has been complicated by high blood pressure. Baby is doing in NICU. Baby is feeding by  Breast/ pumping . Bleeding  return of menses/ heavy flow . Bowel function is normal. Bladder function is normal. Patient is not sexually active. Contraception method is abstinence. Postpartum depression screening: negative.   The pregnancy intention screening data noted above was reviewed. Potential methods of contraception were discussed. The patient elected to proceed with No data recorded.    Health Maintenance Due  Topic Date Due   Pneumococcal Vaccine 28-84 Years old (1 - PCV) Never done   COVID-19 Vaccine (5 - Booster for Pfizer series) 01/10/2020   PAP SMEAR-Modifier  08/07/2020    Medical Record  Review of Systems Pertinent items noted in HPI and remainder of comprehensive ROS otherwise negative.  Objective:  LMP 04/13/2020 (Exact Date)    General:  alert   Breasts:  not indicated  Lungs: clear to auscultation bilaterally  Heart:  regular rate and rhythm, S1, S2 normal, no murmur, click, rub or gallop  Abdomen: soft, non-tender; bowel sounds normal; no masses,  no organomegaly   Wound well approximated incision  GU exam:  not indicated       Assessment:    There are no diagnoses linked to this encounter.  Nl  postpartum exam.  CHTN Plan:   Essential components of care per ACOG recommendations:  1.  Mood and well being: Patient with negative depression screening today. Reviewed local resources for support.  - Patient tobacco use? No.   - hx of drug use? No.    2. Infant care and feeding:  -Patient currently breastmilk feeding? Yes -Social determinants of  health (SDOH) reviewed in EPIC. No concerns  3. Sexuality, contraception and birth spacing - Patient does not want a pregnancy in the next year.  Desired family size is uncertain  - Reviewed forms of contraception in tiered fashion. Patient desired abstinence today.   - Discussed birth spacing of 18 months  4. Sleep and fatigue -Encouraged family/partner/community support of 4 hrs of uninterrupted sleep to help with mood and fatigue  5. Physical Recovery  - Discussed patients delivery and complications. She describes her labor as good. - Patient had a C-section.  - Patient has urinary incontinence? No. - Patient is safe to resume physical and sexual activity  6.  Health Maintenance - HM due items addressed Yes - Last pap smear 12/2016, normal. Pap smear not done at today's visit.  -Breast Cancer screening indicated? No.   7. Chronic Disease/Pregnancy Condition follow up: Hypertension  - PCP follow up  To continue with current BP medications. F/U with PCP and has F/U appt with cardiology Jan 2023 Pt informed the need for c section for all future pregnancies due to vertical uterine incision on last c section.    Arlina Robes, MD [facility] [place], [place]

## 2020-12-26 ENCOUNTER — Ambulatory Visit: Payer: Self-pay

## 2020-12-26 NOTE — Lactation Note (Signed)
This note was copied from a baby's chart.  NICU Lactation Consultation Note  Patient Name: Girl Vivia Rosenburg TGRMB'O Date: 12/26/2020 Age:30 years   Subjective Reason for consult: Weekly NICU follow-up Mother continues to pump frequently. She is concerned about keeping up her supply and routine next week when she returns to work.   Objective Infant data: Mother's Current Feeding Choice: Breast Milk  Infant feeding assessment Scale for Readiness: 3     Maternal data: G1P0101  C-Section, Low Transverse  Pumping frequency: 7-8 x day Pumped volume: 80 mL   Assessment Maternal: Milk volume: Normal   Intervention/Plan Interventions: Education; Infant Driven Feeding Algorithm education (pumpoing and returning to work)  Plan: Consult Status: Follow-up  NICU Follow-up type: Weekly NICU follow up  Mother will continue pumping 7x day  Gwynne Edinger 12/26/2020, 11:07 AM

## 2021-01-02 ENCOUNTER — Ambulatory Visit: Payer: Self-pay

## 2021-01-02 NOTE — Lactation Note (Signed)
This note was copied from a baby's chart. Lactation Consultation Note  Patient Name: Shelby Chandler Date: 01/02/2021 Reason for consult: Breastfeeding assistance;1st time breastfeeding;Primapara;NICU baby;Early term 37-38.6wks;Infant < 6lbs;Mother's request Age:31 wk.o.  Visited with mom of 46 55/66 weeks old (adjusted) NICU female, she's a P1 and voiced she's been doing some lick and learn with baby this week.  LC came in to the room for feeding assist at noon, but baby wasn't ready, she briefly woke up for her cares but when taken to the left breast in cross cradle position she wouldn't even open her mouth and fell asleep.  Mom would like to re-challenge next week when baby is ready, she'd also like to try a NS #24, her NICU RN had brought it in the room, LC showed mom how to use it/place it. Reviewed going back to work plan, pumping schedule, supply/demand and IDF phases.  Maternal Data  Mom's supply has slightly increased but due to decreased pumping sessions is BNL  Feeding Mother's Current Feeding Choice: Breast Milk  LATCH Score Latch: Too sleepy or reluctant, no latch achieved, no sucking elicited.  Audible Swallowing: None  Type of Nipple: Everted at rest and after stimulation (very short shafted, almost flat)  Comfort (Breast/Nipple): Soft / non-tender  Hold (Positioning): Assistance needed to correctly position infant at breast and maintain latch.  LATCH Score: 5  Lactation Tools Discussed/Used Tools: Pump;Flanges;Nipple Shields Nipple shield size: 24 Flange Size: 30 Breast pump type: Double-Electric Breast Pump Pump Education: Setup, frequency, and cleaning;Milk Storage Reason for Pumping: ETI in NICU Pumping frequency: 6 times/24 hours Pumped volume: 90 mL  Interventions Interventions: Breast feeding basics reviewed;Breast massage;Hand express;DEBP;Education;Infant Driven Feeding Algorithm education  Plan of care   Encouraged mom to start pumping  consistently at least 8 pumping sessions/24 hours She'll continue taking baby to pumped/empty breast for some lick & learn   No other support person at this time. All questions and concerns answered, mom to call NICU LC PRN.  Discharge Pump: DEBP;Personal (Spectra 2 and wearable pump)  Consult Status Consult Status: Follow-up Date: 01/02/21 Follow-up type: In-patient   Hartman Minahan [name] 01/02/2021, 12:24 PM

## 2021-01-06 ENCOUNTER — Ambulatory Visit: Payer: Self-pay

## 2021-01-06 NOTE — Lactation Note (Signed)
This note was copied from a baby's chart. Lactation Consultation Note  Patient Name: Shelby Chandler YSHUO'H Date: 01/06/2021   Age:31 wk.o.  LC came in the room for the 6 pm feeding assist but NICU RN Shanon Brow reported that mom wasn't going to be able to make it, she asked the night shift last night to pass the message. Mom to call NICU LC to reschedule whenever she's ready.   [facility] 01/06/2021, 7:08 PM

## 2021-01-16 ENCOUNTER — Ambulatory Visit: Payer: Self-pay

## 2021-01-16 NOTE — Lactation Note (Signed)
This note was copied from a baby's chart.  NICU Lactation Consultation Note  Patient Name: Shelby Chandler Date: 01/16/2021 Age:31 wk.o.   Subjective Reason for consult: Follow-up assessment; Weekly NICU follow-up No change in pumping frequency or volume over the past week. Mother would like to re-challenge at breast; appointment scheduled for tomorrow at 3pm.   Objective Infant data: Mother's Current Feeding Choice: Breast Milk  Infant feeding assessment Scale for Readiness: 2 Scale for Quality: 2     Maternal data: G1P0101  C-Section, Low Transverse Pumping frequency: q3 Pumped volume: 90 mL   Pump: DEBP, Personal (Spectra 2 and wearable pump)  Assessment Maternal: Milk volume: Normal   Intervention/Plan Interventions: Education; Infant Driven Feeding Algorithm education  Plan: NICU Follow-up type: Assist with IDF-2 (Mother does not need to pre-pump before breastfeeding)    Gwynne Edinger 01/16/2021, 2:44 PM

## 2021-01-17 ENCOUNTER — Ambulatory Visit: Payer: Self-pay

## 2021-01-17 NOTE — Lactation Note (Signed)
This note was copied from a baby's chart. Lactation Consultation Note  Patient Name: Shelby Chandler [id number] Date: 01/17/2021 Reason for consult: Primapara;1st time breastfeeding;NICU baby;Breastfeeding assistance;Infant < 6lbs;Other (Comment);Term (SGA, IUGR) Age:31 wk.o.  Visited with mom of 43 25/99 weeks old (adjusted) NICU female, she's a P1 and requested a feeding assist for 3 pm. When LC got into the room baby was getting her cares and was awake for her feeding. However as this LC started to get ready for latching and positioning baby started to fall asleep and lost interest. She would show early feeding cues but would not even her mouth to latch when brought her to breast.  Assisted mom with hand expression to attempt to do finger feeding with baby but baby would not suck consistently on a gloved finger, only 1-2 sucks followed by thrusting. An attempt was documented in flowsheets. Reviewed normal newborn behavior, feeding cues and pumping schedule. Mom unsure if she wants to continue taking baby to breast because she "won't do it" and keeps falling asleep. Explained to mom that we'll support her decision whatever it is as long as baby is getting fed, she was appreciative.   Maternal Data  Mom's supply has slightly decreased and is BNL  Feeding Mother's Current Feeding Choice: Breast Milk and Formula Nipple Type: Dr. Myra Gianotti Preemie  LATCH Score Latch: Too sleepy or reluctant, no latch achieved, no sucking elicited.  Audible Swallowing: None  Type of Nipple: Flat  Comfort (Breast/Nipple): Soft / non-tender  Hold (Positioning): Assistance needed to correctly position infant at breast and maintain latch.  LATCH Score: 4  Lactation Tools Discussed/Used Tools: Pump Flange Size: 30 Breast pump type: Double-Electric Breast Pump Pump Education: Setup, frequency, and cleaning;Milk Storage Reason for Pumping: NICU infant Pumping frequency: 5-6 times/24 hours Pumped volume:  75 mL (75-90 ml)  Interventions Interventions: Breast feeding basics reviewed;Assisted with latch;Breast massage;Hand express;Support pillows;DEBP;Education  Plan of care   Encouraged mom to start pumping consistently at least 8 pumping sessions/24 hours She'll call NICU LC if she wants to schedule another feeding assist, but will work on bottle feedings in the meantime  No other support person at this time. All questions and concerns answered, mom to call NICU LC PRN.  Discharge Pump: DEBP;Personal (Spectra 2 and wearable pump)  Consult Status Consult Status: Follow-up Date: 01/17/21 Follow-up type: In-patient   Coyle Stordahl [name] 01/17/2021, 3:35 PM

## 2021-01-24 ENCOUNTER — Ambulatory Visit: Payer: Self-pay

## 2021-01-24 NOTE — Lactation Note (Signed)
This note was copied from a baby's chart. Lactation Consultation Note  Patient Name: Shelby Chandler FTNBZ'[id number] Date: 01/24/2021 Reason for consult: Follow-up assessment;1st time breastfeeding;NICU baby;Primapara;Term;Infant < 6lbs;Other (Comment) (SGA, IUGR) Age:31 m.o.  Visited with mom of 59 51/50 weeks old (adjusted) NICU female, she's a P1 and reported she got her period again this week. Reviewed strategies to increase her supply; mom is still unsure if she wants to take baby back to breast, she has been working on bottle feedings, baby had a swallow study and she's now getting thickened milk with cereal 1:1.   Reviewed newborn behavior, readiness, feeding cues and expectations.   Maternal Data  Mom's supply has decreased since she got her period for the second time, it's still BNL  Feeding Mother's Current Feeding Choice: Breast Milk and Formula Nipple Type: Dr. Roosvelt Harps level 4  Lactation Tools Discussed/Used Tools: Pump;Flanges Flange Size: 30 Breast pump type: Double-Electric Breast Pump Pump Education: Setup, frequency, and cleaning;Milk Storage Reason for Pumping: NICU infant Pumping frequency: 5-6 times/24 hours Pumped volume: 45 mL  Interventions  DEBP, education  Plan of care   Encouraged mom to start pumping consistently at least 8 pumping sessions/24 hours; or the closest she can get to it She'll call NICU LC if she wants to schedule another feeding assist, but will work on bottle feedings in the meantime   No other support person at this time. All questions and concerns answered, mom to call NICU LC PRN.  Discharge Pump: DEBP;Personal (Spectra 2 and wearable pump)  Consult Status Consult Status: Follow-up Date: 01/24/21 Follow-up type: In-patient   Jemel Ono [name] 01/24/2021, 10:20 AM

## 2021-01-31 ENCOUNTER — Ambulatory Visit: Payer: Self-pay

## 2021-01-31 NOTE — Lactation Note (Signed)
This note was copied from a baby's chart. Lactation Consultation Note  Patient Name: Shelby Chandler TKPTW'[id number] Date: 01/31/2021 Reason for consult: Follow-up assessment;1st time breastfeeding;NICU baby Age:31 m.o.  Lactation had a lovely follow up consult with Shelby Chandler to review her feeding goals and current feeding plan. She is focusing on pumping her breast milk and feeding baby Shelby Chandler a combination of breast milk and formula. Shelby Chandler states that she attempted to breast feed a few times, but has since decided to focus on bottle feeding. We had some discussion about the conditions that led to the decision, and it was one that she put a great deal of thought into.  Given her original plan was to breast feed, I did provide information on resources available to her after discharge. She asked that I include them in this note. One main concern was if baby could still latch at her age (I provided reassurance).   Here are some resources that we discussed: [email or web address] - for Support groups related to newborn care and feeding Outpatient Lactation with [place] [facility] the [facility] - I will put in a referral in the system for a follow up phone call - [phone] Check with her insurance provider ([employer]) regarding at home lactation services available to her including the [facility] She can always call our NICU lactation number: [phone] to speak with a NICU lactation consult and to find some assistance with outpatient resources.   Maternal Data Does the patient have breastfeeding experience prior to this delivery?: No  Feeding Mother's Current Feeding Choice: Breast Milk and Formula Nipple Type: Other (DB Y)   Lactation Tools Discussed/Used Breast pump type: Double-Electric Breast Pump  Interventions Interventions: Education  Discharge Discharge Education: Outpatient recommendation;Outpatient Epic message sent Pump: Personal  Consult  Status Consult Status: Follow-up Date: 01/31/21 Follow-up type: In-patient    Shelby Chandler 01/31/2021, 1:22 PM

## 2021-02-23 ENCOUNTER — Ambulatory Visit: Payer: Managed Care, Other (non HMO) | Admitting: Cardiology

## 2021-05-20 ENCOUNTER — Telehealth (INDEPENDENT_AMBULATORY_CARE_PROVIDER_SITE_OTHER): Payer: Managed Care, Other (non HMO) | Admitting: Family Medicine

## 2021-05-20 DIAGNOSIS — R052 Subacute cough: Secondary | ICD-10-CM | POA: Diagnosis not present

## 2021-05-20 DIAGNOSIS — J029 Acute pharyngitis, unspecified: Secondary | ICD-10-CM | POA: Diagnosis not present

## 2021-05-20 DIAGNOSIS — J301 Allergic rhinitis due to pollen: Secondary | ICD-10-CM

## 2021-05-20 MED ORDER — FLUTICASONE PROPIONATE 50 MCG/ACT NA SUSP: 2.0000 | Freq: Every day | NASAL | 1 refills | Status: DC

## 2021-05-20 MED ORDER — LEVOCETIRIZINE DIHYDROCHLORIDE 5 MG PO TABS: 5.0000 mg | ORAL_TABLET | Freq: Every evening | ORAL | 1 refills | Status: DC

## 2021-05-20 NOTE — Progress Notes (Signed)
Virtual Visit via Video Note ? ?I connected with Shelby Chandler on 05/20/21 at 11:45 AM EDT by a video enabled telemedicine application 2/2 [id number] pandemic and verified that I am speaking with the correct person using two identifiers. ? Location patient: home ?Location provider:work or home office ?Persons participating in the virtual visit: patient, provider ? ?I discussed the limitations of evaluation and management by telemedicine and the availability of in person appointments. The patient expressed understanding and agreed to proceed. ? ? ?HPI: ?Pt with neck pain, dysphagia, back pain, and congestion that started this wk.  Cough x 1 month, dry and productive at times.  Throat feels constricted.  Some wheezing.  Denies fever, ear pain/pressure, facial pain/pressure.  Tried Benadryl, caused residual drowsiness. ? ?Since last OFV, pt had her baby.  Pt's daughter is in daycare.  Pt notes being sick off and on. ? ?ROS: See pertinent positives and negatives per HPI. ? ?Past Medical History:  ?Diagnosis Date  ? Anemia   ? Anxiety   ? Hypertension   ? ? ?Past Surgical History:  ?Procedure Laterality Date  ? CESAREAN SECTION  11/21/2020  ? Procedure: CESAREAN SECTION;  Surgeon: Donnamae Jude, MD;  Location: [facility] LD [facility];  Service: Obstetrics;;  ? TOOTH EXTRACTION    ? ? ?Family History  ?Problem Relation Age of Onset  ? Alcohol abuse Mother   ? Arthritis Mother   ? Depression Mother   ? Drug abuse Mother   ? Heart attack Mother   ? Hypertension Mother   ? Hyperlipidemia Mother   ? Alcohol abuse Father   ? Diabetes Father   ? Drug abuse Father   ? Hypertension Father   ? Heart attack Father   ? Stroke Father   ? Hypertension Sister   ? Arthritis Maternal Grandmother   ? Arthritis Maternal Grandfather   ? Diabetes Paternal Grandmother   ? ? ?Current Outpatient Medications:  ?  acetaminophen (TYLENOL) 500 MG tablet, Take 2 tablets (1,000 mg total) by mouth every 6 (six) hours. (Patient not taking: Reported on 05/20/2021),  Disp: 30 tablet, Rfl: 0 ?  Ferrous Sulfate 220 (44 Fe) MG/5ML SOLN, Take 5 mLs by mouth every other day. (Patient not taking: Reported on 05/20/2021), Disp: 200 mL, Rfl: 2 ?  furosemide (LASIX) 20 MG tablet, Take 1 tablet (20 mg total) by mouth 2 (two) times daily. (Patient not taking: Reported on 05/20/2021), Disp: 30 tablet, Rfl: 0 ?  ibuprofen (ADVIL) 600 MG tablet, Take 1 tablet (600 mg total) by mouth every 6 (six) hours. (Patient not taking: Reported on 05/20/2021), Disp: 30 tablet, Rfl: 0 ?  NIFEdipine (ADALAT CC) 90 MG 24 hr tablet, Take 90 mg by mouth daily. (Patient not taking: Reported on 05/20/2021), Disp: , Rfl:  ?  propranolol (INDERAL) 40 MG tablet, Take 2 tablets (80 mg total) by mouth every 12 (twelve) hours. (Patient not taking: Reported on 05/20/2021), Disp: 120 tablet, Rfl: 1 ? ?EXAM: ? ?VITALS per patient if applicable: RR between 52-77 bpm ? ?GENERAL: alert, oriented, appears well and in no acute distress. ? ?HEENT: atraumatic, conjunctiva clear, no obvious abnormalities on inspection of external nose and ears.  R tonsil swollen >L.  Tonsils not touching and without exudate. ? ?NECK: normal movements of the head and neck ? ?LUNGS: on inspection no signs of respiratory distress, breathing rate appears normal, no obvious gross SOB, gasping or wheezing ? ?CV: no obvious cyanosis ? ?MS: moves all visible extremities without noticeable abnormality ? ?  PSYCH/NEURO: pleasant and cooperative, no obvious depression or anxiety, speech and thought processing grossly intact ? ?ASSESSMENT AND PLAN: ? ?Discussed the following assessment and plan: ? ?Seasonal allergic rhinitis due to pollen  ?-Has noted some improvement with allergy pill we will try a different allergy medication ?-Rx for Xyzal and Flonase sent to pharmacy ?-Continue supportive care symptoms with OTC medications. ?- Plan: fluticasone (FLONASE) 50 MCG/ACT nasal spray, levocetirizine (XYZAL ALLERGY 24HR) 5 MG tablet ? ?Subacute cough ?-discussed  possible causes including allergies, post viral cough, GERD ?-will start xyzal and flonase ?-Given precautions. ?-Follow-up for continued or worsening symptoms. ? ?Sore throat ?-Expectant management including gargling with warm salt water or Chloraseptic spray ?-Continue antihistamine ? ?Follow-up as needed for continued or worsening symptoms ?  ?I discussed the assessment and treatment plan with the patient. The patient was provided an opportunity to ask questions and all were answered. The patient agreed with the plan and demonstrated an understanding of the instructions. ?  ?The patient was advised to call back or seek an in-person evaluation if the symptoms worsen or if the condition fails to improve as anticipated. ? ?Billie Ruddy, MD  ? ?

## 2021-05-21 ENCOUNTER — Telehealth: Payer: Self-pay | Admitting: Family Medicine

## 2021-05-26 ENCOUNTER — Ambulatory Visit: Payer: Managed Care, Other (non HMO) | Admitting: Family Medicine

## 2021-05-26 ENCOUNTER — Encounter: Payer: Self-pay | Admitting: Family Medicine

## 2021-05-26 VITALS — BP 153/102 | HR 89 | Temp 98.1°F | Wt >= 6400 oz

## 2021-05-26 DIAGNOSIS — I10 Essential (primary) hypertension: Secondary | ICD-10-CM

## 2021-05-26 DIAGNOSIS — M25561 Pain in right knee: Secondary | ICD-10-CM | POA: Diagnosis not present

## 2021-05-26 DIAGNOSIS — R052 Subacute cough: Secondary | ICD-10-CM | POA: Diagnosis not present

## 2021-05-26 DIAGNOSIS — H6501 Acute serous otitis media, right ear: Secondary | ICD-10-CM

## 2021-05-26 DIAGNOSIS — G8929 Other chronic pain: Secondary | ICD-10-CM

## 2021-05-26 MED ORDER — BENZONATATE 100 MG PO CAPS
100.0000 mg | ORAL_CAPSULE | Freq: Two times a day (BID) | ORAL | 0 refills | Status: DC | PRN
Start: 2021-05-26 — End: 2022-08-08

## 2021-05-26 MED ORDER — AMOXICILLIN-POT CLAVULANATE 500-125 MG PO TABS: 1.0000 | ORAL_TABLET | Freq: Two times a day (BID) | ORAL | 0 refills | Status: AC

## 2021-05-26 MED ORDER — PROPRANOLOL HCL 40 MG PO TABS: 40.0000 mg | ORAL_TABLET | Freq: Two times a day (BID) | ORAL | 1 refills | Status: DC

## 2021-05-26 NOTE — Progress Notes (Signed)
Subjective:  ? ? Patient ID: Shelby Chandler, female    DOB: 09-Jun-1990, 31 y.o.   MRN: [id number] ? ?Chief Complaint  ?Patient presents with  ? Follow-up  ?  Still has the cough, both dry and productive. Sometimes feels like she is vomiting mucus ?Can't hear out of right ear. Feels like it is liquid is in, full and muffled.causing head to hurt.  ? ? ?HPI ?Patient was seen today for conitnued symptoms.  Pt seen 4/13 virtually for allergy symptoms.  Pt notes improvement, but developed R ear pain and cough.  Cough is both dry and productive at times.  Pt denies fever, chills, rhinorrhea, neck pain. ? ?Patient interested in weight loss medication.  Previously discussed prior to pt becoming pregnant.  Patient focused on losing weight.  Walking more, eating out less.  Notes right knee is starting to hurt again.  Patient weighed 455 pounds 7 months ago.  Has continued to lose weight currently 412 lbs. in the past patient tried weight watchers, intermittent fasting, exercising 5 times a week, swimming, decreasing calories, liquid diet and becoming a vegetarian.  Patient was a vegetarian from 2017 until becoming pregnant when she started eating meat again. ? ?Past Medical History:  ?Diagnosis Date  ? Anemia   ? Anxiety   ? Hypertension   ? ? ?No Known Allergies ? ?ROS ?General: Denies fever, chills, night sweats, changes in weight, changes in appetite ?HEENT: Denies headaches, ear pain, changes in vision, rhinorrhea, sore throat + right ear pain/clogged sensation ?CV: Denies CP, palpitations, SOB, orthopnea ?Pulm: Denies SOB, wheezing  + cough ?GI: Denies abdominal pain, nausea, vomiting, diarrhea, constipation ?GU: Denies dysuria, hematuria, frequency, vaginal discharge ?Msk: Denies muscle cramps, joint pains ?Neuro: Denies weakness, numbness, tingling ?Skin: Denies rashes, bruising ?Psych: Denies depression, anxiety, hallucinations ? ?   ?Objective:  ?  ?Blood pressure (!) 153/102, pulse 89, temperature 98.1 ?F (36.7  ?C), temperature source Oral, weight (!) 412 lb 12.8 oz (187.2 kg), SpO2 100 %, not currently breastfeeding. Body mass index is 68.69 kg/m?. ? ?Gen. Pleasant, well-nourished, in no distress, normal affect   ?HEENT: [place]/AT, face symmetric, conjunctiva clear, no scleral icterus, PERRLA, EOMI, nares patent without drainage, pharynx without erythema or exudate. R TM with air fluid level and bubbles.  L TM normal. ?Neck: No JVD, no thyromegaly, no carotid bruits ?Lungs: no accessory muscle use, CTAB, no wheezes or rales ?Cardiovascular: RRR, no m/r/g, no peripheral edema ?Musculoskeletal: No deformities, no cyanosis or clubbing, normal tone ?Neuro:  A&Ox3, CN II-XII intact, normal gait ?Skin:  Warm, no lesions/ rash ? ? ?Wt Readings from Last 3 Encounters:  ?05/26/21 (!) 412 lb 12.8 oz (187.2 kg)  ?12/24/20 (!) 425 lb 9.6 oz (193.1 kg)  ?12/01/20 (!) 433 lb 9.6 oz (196.7 kg)  ? ? ?Lab Results  ?Component Value Date  ? WBC 18.7 (H) 11/22/2020  ? HGB 9.8 (L) 11/22/2020  ? HCT 31.4 (L) 11/22/2020  ? PLT 430 (H) 11/22/2020  ? GLUCOSE 78 11/21/2020  ? CHOL 174 01/09/2017  ? TRIG 72 01/09/2017  ? HDL 51 01/09/2017  ? LDLCALC 109 (H) 01/09/2017  ? ALT 14 11/21/2020  ? AST 23 11/21/2020  ? NA 136 11/21/2020  ? K 4.3 11/21/2020  ? CL 104 11/21/2020  ? CREATININE 0.81 11/21/2020  ? BUN 14 11/21/2020  ? CO2 21 (L) 11/21/2020  ? TSH 3.47 07/01/2019  ? INR 1.0 08/07/2020  ? HGBA1C 5.3 07/29/2020  ? ? ?Assessment/Plan: ? ?Right  acute serous otitis media, recurrence not specified  ?-Expectant management of symptoms ?-Start Augmentin ?-Tylenol as needed for any pain/discomfort ?- Plan: amoxicillin-clavulanate (AUGMENTIN) 500-125 MG tablet ? ?Subacute cough ?-Likely 2/2 viral etiology ?-Expectant management ?- Plan: benzonatate (TESSALON) 100 MG capsule ? ?Morbid obesity ([place])  ?-Body mass index is 68.69 kg/m?. ?-Patient congratulated on 43 lbs weight loss as previously 455 lbs, currently 412 lbs this visit ?-Patient is  motivated ?-Continue lifestyle modifications including increasing steps and eating out less. ?-Discussed various weight loss medication options.  Patient to check with insurance company to see which medications are covered ?-We will obtain labs ?- Plan: CMP, Vitamin D, 25-hydroxy, TSH, T4, Free, CBC with Differential/Platelet ? ?Chronic pain of right knee ?-Likely 2/2 current weight ?-Patient encouraged to continue increasing physical activity ?-Supportive care including OTC topical medications ? ?Essential hypertension ?-Uncontrolled ?-Discussed restarting BP meds.  Previously on Procardia XL and propranolol while pregnant.  Was on Norvasc 10 milligrams prior to pregnancy. ?-Restart propranolol 40 mg twice daily ?-Lifestyle modifications ?-Patient encouraged to check BP and keep a log to bring with her to clinic ?-Further med adjustments may be necessary ?- Plan: propranolol (INDERAL) 40 MG tablet ? ?F/u in 1 month, sooner if needed for worsening of acute illness ? ?Grier Mitts, MD ?

## 2021-05-26 NOTE — Patient Instructions (Addendum)
As you blood pressure is elevated again you should restart blood pressure medication.  A new prescription for propranolol 40 mg twice a day was sent to your pharmacy.  We will have you restart this medicine and check your blood pressure when able.  We will have you follow-up in 1 month to see how things are going. ? ?A prescription for Augmentin was sent to your pharmacy for your ear infection. ? ?Ask your insurance company about medications they cover for weight loss.  Such medicines include Qsymia, Saxenda, Contrave,lorcaserin, orlistat, bupropion, or phentermine. ?

## 2021-05-27 LAB — COMPREHENSIVE METABOLIC PANEL
ALT: 14 U/L (ref 0–35)
AST: 17 U/L (ref 0–37)
Albumin: 3.8 g/dL (ref 3.5–5.2)
Alkaline Phosphatase: 50 U/L (ref 39–117)
BUN: 8 mg/dL (ref 6–23)
CO2: 27 mEq/L (ref 19–32)
Calcium: 9.1 mg/dL (ref 8.4–10.5)
Chloride: 101 mEq/L (ref 96–112)
Creatinine, Ser: 0.98 mg/dL (ref 0.40–1.20)
GFR: 76.78 mL/min (ref >60.00)
Glucose, Bld: 70 mg/dL (ref 70–99)
Potassium: 3.7 mEq/L (ref 3.5–5.1)
Sodium: 136 mEq/L (ref 135–145)
Total Bilirubin: 0.2 mg/dL (ref 0.2–1.2)
Total Protein: 8.1 g/dL (ref 6.0–8.3)

## 2021-05-27 LAB — CBC WITH DIFFERENTIAL/PLATELET
Basophils Absolute: 0.1 10*3/uL (ref 0.0–0.1)
Basophils Relative: 1.5 % (ref 0.0–3.0)
Eosinophils Absolute: 0.2 10*3/uL (ref 0.0–0.7)
Eosinophils Relative: 4.1 % (ref 0.0–5.0)
HCT: 29.2 % — ABNORMAL LOW (ref 36.0–46.0)
Hemoglobin: 9 g/dL — ABNORMAL LOW (ref 12.0–15.0)
Lymphocytes Relative: 32.3 % (ref 12.0–46.0)
Lymphs Abs: 1.8 10*3/uL (ref 0.7–4.0)
MCHC: 30.8 g/dL (ref 30.0–36.0)
MCV: 61.7 fl — ABNORMAL LOW (ref 78.0–100.0)
Monocytes Absolute: 0.5 10*3/uL (ref 0.1–1.0)
Monocytes Relative: 9.6 % (ref 3.0–12.0)
Neutro Abs: 2.9 10*3/uL (ref 1.4–7.7)
Neutrophils Relative %: 52.5 % (ref 43.0–77.0)
Platelets: 510 10*3/uL — ABNORMAL HIGH (ref 150.0–400.0)
RBC: 4.73 Mil/uL (ref 3.87–5.11)
RDW: 19.1 % — ABNORMAL HIGH (ref 11.5–15.5)
WBC: 5.5 10*3/uL (ref 4.0–10.5)

## 2021-05-27 LAB — T4, FREE: Free T4: 0.79 ng/dL (ref 0.60–1.60)

## 2021-05-27 LAB — VITAMIN D 25 HYDROXY (VIT D DEFICIENCY, FRACTURES): VITD: 15.04 ng/mL — ABNORMAL LOW (ref 30.00–100.00)

## 2021-05-27 LAB — TSH: TSH: 1.71 u[IU]/mL (ref 0.35–5.50)

## 2021-05-28 ENCOUNTER — Other Ambulatory Visit: Payer: Self-pay | Admitting: Family Medicine

## 2021-05-28 ENCOUNTER — Telehealth: Payer: Self-pay | Admitting: Family Medicine

## 2021-05-28 DIAGNOSIS — E559 Vitamin D deficiency, unspecified: Secondary | ICD-10-CM

## 2021-05-28 DIAGNOSIS — D649 Anemia, unspecified: Secondary | ICD-10-CM

## 2021-05-28 MED ORDER — VITAMIN D (ERGOCALCIFEROL) 1.25 MG (50000 UNIT) PO CAPS: 50000.0000 [IU] | ORAL_CAPSULE | ORAL | 0 refills | Status: DC

## 2021-05-28 MED ORDER — FERROUS SULFATE 220 (44 FE) MG/5ML PO SOLN: 5.0000 mL | ORAL | 2 refills | Status: DC

## 2021-05-28 NOTE — Telephone Encounter (Signed)
Called pt again to discuss results, no answer. ?

## 2021-05-28 NOTE — Telephone Encounter (Signed)
Pt sees notes in [place], however would like to speak with someone regarding results ?

## 2021-06-03 NOTE — Telephone Encounter (Signed)
Rx sent to pharmacy on [address]. on 05/28/21. ?

## 2021-06-11 ENCOUNTER — Other Ambulatory Visit: Payer: Self-pay | Admitting: Family Medicine

## 2021-06-11 ENCOUNTER — Encounter: Payer: Self-pay | Admitting: Family Medicine

## 2021-06-11 DIAGNOSIS — J301 Allergic rhinitis due to pollen: Secondary | ICD-10-CM

## 2021-06-15 NOTE — Telephone Encounter (Signed)
Patient is calling to follow up on if she needs an office visit to be prescribed Bupropion or not. ? ? ? ? ?[id number] [place]  [phone] ?Fax:  [phone] ?  ? ? ? ? ? ? ?Please advise  ?

## 2021-06-21 ENCOUNTER — Other Ambulatory Visit: Payer: Self-pay | Admitting: Family Medicine

## 2021-06-21 MED ORDER — SEMAGLUTIDE-WEIGHT MANAGEMENT 0.25 MG/0.5ML ~~LOC~~ SOAJ: 0.2500 mg | SUBCUTANEOUS | 0 refills | Status: DC

## 2021-06-21 MED ORDER — SEMAGLUTIDE-WEIGHT MANAGEMENT 0.5 MG/0.5ML ~~LOC~~ SOAJ: 0.5000 mg | SUBCUTANEOUS | 0 refills | Status: DC

## 2021-06-25 ENCOUNTER — Other Ambulatory Visit: Payer: Self-pay | Admitting: Family Medicine

## 2021-06-25 DIAGNOSIS — J301 Allergic rhinitis due to pollen: Secondary | ICD-10-CM

## 2021-06-29 ENCOUNTER — Telehealth: Payer: Self-pay

## 2021-06-29 NOTE — Telephone Encounter (Signed)
PA  key [id number]  Form rec'd by fax, completed and returned, fax confirmation rec'd.

## 2021-06-30 NOTE — Telephone Encounter (Signed)
Disregard

## 2021-07-14 NOTE — Telephone Encounter (Signed)
PA for medication was denied, denial letter rec'd 07/14/21.

## 2021-08-03 DIAGNOSIS — R1310 Dysphagia, unspecified: Secondary | ICD-10-CM | POA: Diagnosis not present

## 2021-09-24 ENCOUNTER — Telehealth: Payer: Self-pay

## 2021-09-24 NOTE — Telephone Encounter (Signed)
PA started for [facility]. Key [id number]

## 2021-10-01 ENCOUNTER — Telehealth (INDEPENDENT_AMBULATORY_CARE_PROVIDER_SITE_OTHER): Payer: Managed Care, Other (non HMO) | Admitting: Family Medicine

## 2021-10-01 DIAGNOSIS — F322 Major depressive disorder, single episode, severe without psychotic features: Secondary | ICD-10-CM

## 2021-10-01 MED ORDER — SERTRALINE HCL 25 MG PO TABS: 25.0000 mg | ORAL_TABLET | Freq: Every day | ORAL | 3 refills | Status: DC
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
## 2021-10-01 NOTE — Telephone Encounter (Signed)
PA for Semaglutide was denied. Denial letter rec'd 10/01/21.

## 2021-10-17 ENCOUNTER — Encounter: Payer: Self-pay | Admitting: Family Medicine

## 2021-10-25 ENCOUNTER — Other Ambulatory Visit: Payer: Self-pay | Admitting: Family Medicine

## 2021-10-25 DIAGNOSIS — F322 Major depressive disorder, single episode, severe without psychotic features: Secondary | ICD-10-CM

## 2022-03-11 ENCOUNTER — Ambulatory Visit: Payer: BC Managed Care – PPO | Admitting: Family Medicine

## 2022-03-11 VITALS — BP 148/108 | HR 101 | Temp 98.7°F | Ht 65.0 in | Wt >= 6400 oz

## 2022-03-11 DIAGNOSIS — L309 Dermatitis, unspecified: Secondary | ICD-10-CM

## 2022-03-11 DIAGNOSIS — I10 Essential (primary) hypertension: Secondary | ICD-10-CM

## 2022-03-11 MED ORDER — SEMAGLUTIDE-WEIGHT MANAGEMENT 0.5 MG/0.5ML ~~LOC~~ SOAJ: 0.5000 mg | SUBCUTANEOUS | 0 refills | Status: DC

## 2022-03-11 MED ORDER — SEMAGLUTIDE-WEIGHT MANAGEMENT 0.25 MG/0.5ML ~~LOC~~ SOAJ: 0.2500 mg | SUBCUTANEOUS | 0 refills | Status: DC

## 2022-03-11 MED ORDER — TRIAMCINOLONE ACETONIDE 0.025 % EX OINT: 1.0000 | TOPICAL_OINTMENT | Freq: Two times a day (BID) | CUTANEOUS | 0 refills | Status: AC

## 2022-03-11 NOTE — Progress Notes (Signed)
   Established Patient Office Visit   Subjective  Patient ID: Shelby Chandler, female    DOB: 1990/08/21  Age: 32 y.o. MRN: [id number]  Chief Complaint  Patient presents with   Rash    Pt reports rash on neck and both arms, more on R arm. Associate with itchy. Tried nystatin cream, AnD cream and head/shoulder shampoo. Pt states her rash is not itchy today.     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  Pt with pruritic rash on neck and R arm x 2 wks.  Some improvement in symptoms today.  Tried nystatin cream, A&E ointment, head and shoulders.  Patient denies changes in soaps, lotions, detergents.  Pt interested in wt loss medication options.  Initially was against the idea, but has been trying on her own without much success.  Taking propranolol for bp.        ROS Negative unless stated above    Objective:     BP (!) 148/108 (BP Location: Left Wrist, Patient Position: Sitting, Cuff Size: Large)   Pulse (!) 101   Temp 98.7 F (37.1 C) (Oral)   Ht 5' 5"$  (1.651 m)   Wt (!) 425 lb 3.2 oz (192.9 kg)   SpO2 99%   BMI 70.76 kg/m    Physical Exam Constitutional:      General: She is not in acute distress.    Appearance: Normal appearance.  HENT:     Head: Normocephalic and atraumatic.     Nose: Nose normal.     Mouth/Throat:     Mouth: Mucous membranes are moist.  Cardiovascular:     Rate and Rhythm: Normal rate and regular rhythm.     Heart sounds: Normal heart sounds. No murmur heard.    No gallop.  Pulmonary:     Effort: Pulmonary effort is normal. No respiratory distress.     Breath sounds: Normal breath sounds. No wheezing, rhonchi or rales.  Skin:    General: Skin is warm and dry.     Findings: Rash present.     Comments: Faint rash on R upper arm and neck.  Neurological:     Mental Status: She is alert and oriented to person,  place, and time.      No results found for any visits on 03/11/22.    Assessment & Plan:  Dermatitis -     Triamcinolone Acetonide; Apply 1 Application topically 2 (two) times daily for 7 days.  Dispense: 30 g; Refill: 0  Essential hypertension -recheck bp -discussed continue lifestyle modifications -will need to restart propranolol or other bp med.   -check bp notify clinic if consistently >140/90.  Morbid obesity ([place]) -Body mass index is 70.76 kg/m. -continue lifestyle modifications -discussed r/b/a of various wt loss meds.  Start semiglutide.  Referral to wt management. -     Amb Ref to Medical Weight Management -     Semaglutide-Weight Management; Inject 0.25 mg into the skin once a week.  Dispense: 2 mL; Refill: 0 -     Semaglutide-Weight Management; Inject 0.5 mg into the skin once a week.  Dispense: 2 mL; Refill: 0   Return in about 6 weeks (around 04/22/2022) for blood pressure re-check, weight management.   Billie Ruddy, MD

## 2022-03-11 NOTE — Patient Instructions (Signed)
Referral was placed for weight management.  You should expect a phone call about scheduling this appointment.  Semaglutide prescription was resent to pharmacy.  If needed we will complete a prior authorization.  You would inject 0.25 mg into the skin weekly x 4 weeks.  Then increase to the 0.5 mg dose weekly for 4 weeks.

## 2022-04-01 ENCOUNTER — Telehealth: Payer: Self-pay | Admitting: Family Medicine

## 2022-04-01 NOTE — Telephone Encounter (Signed)
Pt is calling and PA is need for Semaglutide-Weight Management 0.25 MG/0.5ML SOAJ

## 2022-04-04 NOTE — Telephone Encounter (Signed)
PA initiated.  Shelby Chandler (Key: [id number]) Wegovy 0.'25MG'$ /0.5ML auto-injectors

## 2022-04-06 NOTE — Telephone Encounter (Signed)
0.'25mg'$  was canceled due to 0.'5mg'$  was submitted first.   Spoke to insurance agent to see if they can switch it around. She states 0.'25mg'$  would be still covered under 0.'5mg'$  case. The case will be notified by tomorrow.  Rx case #: [id number]

## 2022-04-06 NOTE — Telephone Encounter (Signed)
Your prior authorization request has been denied.  Working on an Production designer, theatre/television/film

## 2022-04-07 NOTE — Telephone Encounter (Signed)
Contact pt. Update her the PA is still pending.

## 2022-04-07 NOTE — Telephone Encounter (Signed)
Spoke to [place] with [employer]. To assist with answering the rest of additional information for [facility] PA.

## 2022-04-11 NOTE — Telephone Encounter (Signed)
PA was approved. Contacted [facility] pharmacy and spoke to [place]. She states it went thru fine with $25 co-pay. They will order it and notify the pt.  Pt was made aware.

## 2022-04-13 ENCOUNTER — Other Ambulatory Visit (HOSPITAL_COMMUNITY): Payer: Self-pay

## 2022-05-04 ENCOUNTER — Encounter (INDEPENDENT_AMBULATORY_CARE_PROVIDER_SITE_OTHER): Payer: Managed Care, Other (non HMO) | Admitting: Family Medicine

## 2022-05-09 ENCOUNTER — Encounter: Payer: Self-pay | Admitting: Family Medicine

## 2022-05-09 ENCOUNTER — Ambulatory Visit: Payer: BC Managed Care – PPO | Admitting: Family Medicine

## 2022-05-09 VITALS — BP 148/100 | HR 77 | Temp 98.4°F | Ht 65.0 in | Wt >= 6400 oz

## 2022-05-09 DIAGNOSIS — I10 Essential (primary) hypertension: Secondary | ICD-10-CM | POA: Diagnosis not present

## 2022-05-09 DIAGNOSIS — Z7689 Persons encountering health services in other specified circumstances: Secondary | ICD-10-CM

## 2022-05-09 MED ORDER — SEMAGLUTIDE-WEIGHT MANAGEMENT 1.7 MG/0.75ML ~~LOC~~ SOAJ: 1.7000 mg | SUBCUTANEOUS | 0 refills | Status: AC

## 2022-05-09 MED ORDER — PROPRANOLOL HCL ER 120 MG PO CP24: 120.0000 mg | ORAL_CAPSULE | Freq: Every day | ORAL | 1 refills | Status: DC

## 2022-05-09 MED ORDER — SEMAGLUTIDE-WEIGHT MANAGEMENT 1 MG/0.5ML ~~LOC~~ SOAJ: 1.0000 mg | SUBCUTANEOUS | 0 refills | Status: AC

## 2022-05-09 MED ORDER — SEMAGLUTIDE-WEIGHT MANAGEMENT 0.5 MG/0.5ML ~~LOC~~ SOAJ: 0.5000 mg | SUBCUTANEOUS | 0 refills | Status: AC

## 2022-05-09 MED ORDER — SEMAGLUTIDE-WEIGHT MANAGEMENT 2.4 MG/0.75ML ~~LOC~~ SOAJ: 2.4000 mg | SUBCUTANEOUS | 0 refills | Status: AC

## 2022-05-09 NOTE — Progress Notes (Signed)
Established Patient Office Visit   Subjective  Patient ID: Shelby Chandler, female    DOB: 10/02/1990  Age: 32 y.o. MRN: [id number]  Chief Complaint  Patient presents with   Follow-up    Pt is follow up on wegovy. Pt reports she doesn't think it is working. Lost 2lb since last visit 03/11/2022.     Pt is a 32 yo female with pmh sig for obeisty, h/o anxiety and depression, anemia, HTN who is seen for follow-up on weight management.  Patient started Wegovy 0.25 mg last month.  Has only lost 2 pounds.  Did not realize she needs to increase medication to 0.5 mg weekly this month.  Patient working on portion sizes and walking more during breaks at work.  Patient states she is able to get an appointment with weight management but it is not until May.   Patient has not realized BP has remained elevated but she does not feel bad.  Taking propranolol 40 mg twice daily.      ROS Negative unless stated above    Objective:     BP (!) 148/100 (BP Location: Left Arm, Cuff Size: Large)   Pulse 77   Temp 98.4 F (36.9 C) (Oral)   Ht 5\' 5"  (1.651 m)   Wt (!) 424 lb (192.3 kg)   LMP 04/11/2022 (Approximate)   SpO2 96%   Breastfeeding No   BMI 70.56 kg/m  BP Readings from Last 3 Encounters:  05/09/22 (!) 148/100  03/11/22 (!) 148/108  05/26/21 (!) 153/102   Wt Readings from Last 3 Encounters:  05/09/22 (!) 424 lb (192.3 kg)  03/11/22 (!) 425 lb 3.2 oz (192.9 kg)  05/26/21 (!) 412 lb 12.8 oz (187.2 kg)      Physical Exam Constitutional:      General: She is not in acute distress.    Appearance: Normal appearance.  HENT:     Head: Normocephalic and atraumatic.     Nose: Nose normal.     Mouth/Throat:     Mouth: Mucous membranes are moist.  Cardiovascular:     Rate and Rhythm: Normal rate.     Heart sounds: No murmur heard.    No gallop.  Pulmonary:     Effort: Pulmonary effort is normal. No respiratory distress.     Breath sounds: No wheezing, rhonchi or rales.   Skin:    General: Skin is warm and dry.  Neurological:     Mental Status: She is alert and oriented to person, place, and time.      No results found for any visits on 05/09/22.    Assessment & Plan:  Encounter for weight management -     Semaglutide-Weight Management; Inject 0.5 mg into the skin once a week for 28 days.  Dispense: 2 mL; Refill: 0 -     Semaglutide-Weight Management; Inject 1 mg into the skin once a week for 28 days.  Dispense: 2 mL; Refill: 0 -     Semaglutide-Weight Management; Inject 1.7 mg into the skin once a week for 28 days.  Dispense: 3 mL; Refill: 0 -     Semaglutide-Weight Management; Inject 2.4 mg into the skin once a week for 28 days.  Dispense: 3 mL; Refill: 0  Morbid obesity -     Semaglutide-Weight Management; Inject 0.5 mg into the skin once a week for 28 days.  Dispense: 2 mL; Refill: 0 -     Semaglutide-Weight Management; Inject 1 mg into the skin once  a week for 28 days.  Dispense: 2 mL; Refill: 0 -     Semaglutide-Weight Management; Inject 1.7 mg into the skin once a week for 28 days.  Dispense: 3 mL; Refill: 0 -     Semaglutide-Weight Management; Inject 2.4 mg into the skin once a week for 28 days.  Dispense: 3 mL; Refill: 0  Essential hypertension -     Propranolol HCl ER; Take 1 capsule (120 mg total) by mouth daily.  Dispense: 90 capsule; Refill: 1  Discussed dosing for Wegovy.  Patient to start 0.5 mg weekly dose.  If tolerated will continue increasing monthly.  Encouraged to keep weight management appointment next month.  Continue lifestyle modifications.  Patient encouraged to increase physical activity as tolerated.  BP uncontrolled.  Will increase propranolol from 40 mg twice daily to the extended release version, 120 mg daily for pt convenience.  Continue monitoring blood pressure.  Given strict precautions.  Return in about 3 months (around 08/08/2022).   Billie Ruddy, MD

## 2022-05-09 NOTE — Patient Instructions (Signed)
A prescription for propranolol ER 120 mg was sent to your pharmacy.  This is a once a day version of your blood pressure medication.  Prescriptions for [facility] were also sent to the pharmacy.  You will start taking 0.5 mg weekly for 1 month then increase to 1 mg weekly for 1 month, then 1.7 mg weekly for 1 month, then 2.4 mg weekly.  If we need to remain at a certain dose for more than a month we can do this.  Just let the clinic so a new refill can be sent.

## 2022-05-13 ENCOUNTER — Encounter: Payer: Self-pay | Admitting: Family Medicine

## 2022-05-13 ENCOUNTER — Ambulatory Visit: Payer: BC Managed Care – PPO | Admitting: Family Medicine

## 2022-05-13 VITALS — BP 160/100 | HR 80 | Temp 98.4°F | Ht 65.0 in | Wt >= 6400 oz

## 2022-05-13 DIAGNOSIS — J358 Other chronic diseases of tonsils and adenoids: Secondary | ICD-10-CM | POA: Diagnosis not present

## 2022-05-13 DIAGNOSIS — Z20828 Contact with and (suspected) exposure to other viral communicable diseases: Secondary | ICD-10-CM

## 2022-05-13 DIAGNOSIS — R059 Cough, unspecified: Secondary | ICD-10-CM | POA: Diagnosis not present

## 2022-05-13 DIAGNOSIS — J029 Acute pharyngitis, unspecified: Secondary | ICD-10-CM | POA: Diagnosis not present

## 2022-05-13 DIAGNOSIS — B379 Candidiasis, unspecified: Secondary | ICD-10-CM

## 2022-05-13 DIAGNOSIS — T3695XA Adverse effect of unspecified systemic antibiotic, initial encounter: Secondary | ICD-10-CM

## 2022-05-13 LAB — POCT RAPID STREP A (OFFICE): Rapid Strep A Screen: NEGATIVE

## 2022-05-13 LAB — POC COVID19 BINAXNOW: SARS Coronavirus 2 Ag: NEGATIVE

## 2022-05-13 LAB — POCT INFLUENZA A/B
Influenza A, POC: NEGATIVE
Influenza B, POC: NEGATIVE

## 2022-05-13 MED ORDER — AZITHROMYCIN 250 MG PO TABS
ORAL_TABLET | ORAL | 0 refills | Status: AC
Start: 2022-05-13 — End: 2022-05-18

## 2022-05-13 MED ORDER — FLUCONAZOLE 150 MG PO TABS
ORAL_TABLET | ORAL | 0 refills | Status: DC
Start: 2022-05-13 — End: 2022-08-08

## 2022-05-13 NOTE — Progress Notes (Signed)
Established Patient Office Visit   Subjective  Patient ID: Shelby Chandler, female    DOB: [birth year]  Age: 32 y.o. MRN: [id number]  Chief Complaint  Patient presents with   Influenza    Pt daughter tested positive for flu pt her with cold sx     Patient is a 32 year old female seen for acute concern.  Patient endorses exposure to flu A from her daughter who was sick last week.  Patient started developing symptoms about 4 days ago.  Endorses stiff neck, sore throat, headache, backache, nasal congestion, cough.  Symptoms improving, cough remains.   Influenza      ROS Negative unless stated above    Objective:     BP (!) 160/100   Pulse 80   Temp 98.4 F (36.9 C) (Oral)   Ht 5\' 5"  (1.651 m)   Wt (!) 428 lb (194.1 kg)   LMP 04/11/2022 (Approximate)   SpO2 98%   BMI 71.22 kg/m    Physical Exam Constitutional:      General: She is not in acute distress.    Appearance: Normal appearance.  HENT:     Head: Normocephalic and atraumatic.     Nose: Nose normal.     Mouth/Throat:     Mouth: Mucous membranes are moist.     Tonsils: Tonsillar exudate present.  Eyes:     Extraocular Movements: Extraocular movements intact.     Conjunctiva/sclera: Conjunctivae normal.  Cardiovascular:     Rate and Rhythm: Normal rate and regular rhythm.     Heart sounds: No murmur heard.    No gallop.  Pulmonary:     Effort: Pulmonary effort is normal. No respiratory distress.     Breath sounds: Normal breath sounds. No wheezing, rhonchi or rales.  Skin:    General: Skin is warm and dry.  Neurological:     Mental Status: She is alert and oriented to person, place, and time.      Results for orders placed or performed in visit on 05/13/22  POC COVID-19  Result Value Ref Range   SARS Coronavirus 2 Ag Negative Negative  POC Influenza A/B  Result Value Ref Range   Influenza A, POC Negative Negative   Influenza B, POC Negative Negative  POC Rapid Strep A  Result Value  Ref Range   Rapid Strep A Screen Negative Negative      Assessment & Plan:  Sore throat -     POC COVID-19 BinaxNow -     POCT Influenza A/B -     POCT rapid strep A -     Azithromycin; Take 2 tablets on day 1, then 1 tablet daily on days 2 through 5  Dispense: 6 tablet; Refill: 0  Cough, unspecified type -     POC COVID-19 BinaxNow -     POCT Influenza A/B -     POCT rapid strep A  Exposure to the flu  Tonsillar exudate -     Azithromycin; Take 2 tablets on day 1, then 1 tablet daily on days 2 through 5  Dispense: 6 tablet; Refill: 0  Antibiotic-induced yeast infection -     Fluconazole; Take 1 tab now.  Repeat dose in 3 days if needed for continued symptoms.  Dispense: 2 tablet; Refill: 0  COVID, strep, flu testing negative in clinic.  Given duration of symptoms advised antiviral medications for flu less likely to be effective.  Start ABX for tonsillar exudate on exam.  Continue symptomatic treatment  of symptoms.  Given precautions.   Return if symptoms worsen or fail to improve.   Shelby SaintShannon R Miliyah Luper, MD

## 2022-07-07 ENCOUNTER — Encounter (INDEPENDENT_AMBULATORY_CARE_PROVIDER_SITE_OTHER): Payer: Managed Care, Other (non HMO) | Admitting: Family Medicine

## 2022-07-14 ENCOUNTER — Encounter (INDEPENDENT_AMBULATORY_CARE_PROVIDER_SITE_OTHER): Payer: Managed Care, Other (non HMO) | Admitting: Family Medicine

## 2022-07-20 IMAGING — MR MR HEAD W/O CM
11 of 13 series · 34 of 48 positions shown · non-contrast
Comparison: No pertinent prior exam.

CLINICAL DATA: Abnormal right-sided sensations during pregnancy.

EXAM:
MRI HEAD WITHOUT CONTRAST
MRA HEAD WITHOUT CONTRAST
TECHNIQUE: Multiplanar, multi-echo pulse sequences of the brain and surrounding
structures were acquired without intravenous contrast. Angiographic
images of the Circle of Willis were acquired using MRA technique
without intravenous contrast.

[Series 9: DWI · axial · 3.0mm · 0.88mm/px · z∈[-155,-15]mm · 7 of 96 slices shown (1 of 4)]
[im 1/96]
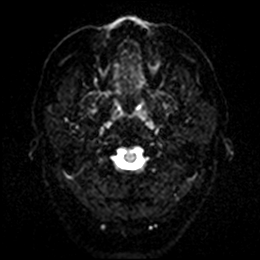
[im 16/96]
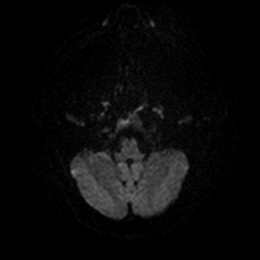
[im 32/96]
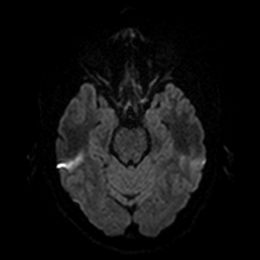
[im 48/96]
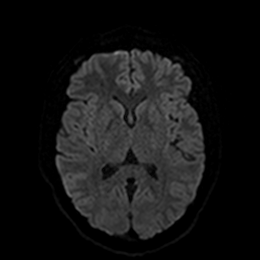
[im 64/96]
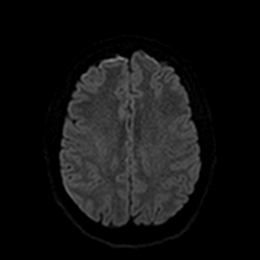
[im 80/96]
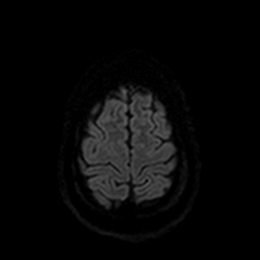
[im 96/96]
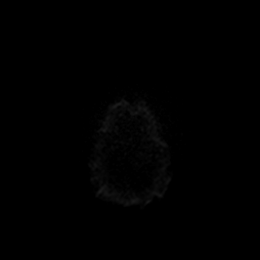

[Series 10: DWI · axial · 3.0mm · 0.88mm/px · z∈[-155,-15]mm · 4 of 48 slices shown (2 of 4)]
[im 1/48]
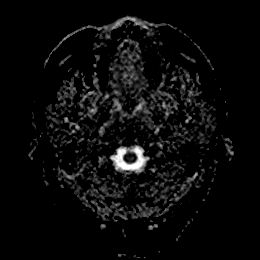
[im 16/48]
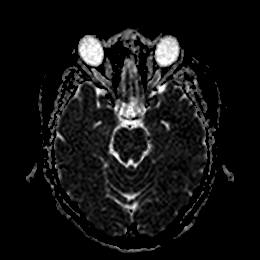
[im 32/48]
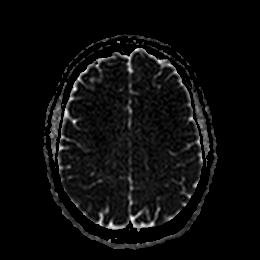
[im 48/48]
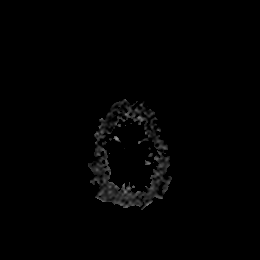

[Series 11: DWI · coronal · 4.0mm · 0.88mm/px · 4 of 64 slices shown (3 of 4)]
[im 1/64]
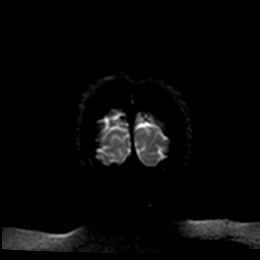
[im 22/64]
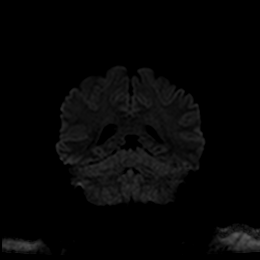
[im 43/64]
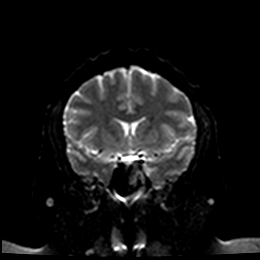
[im 64/64]
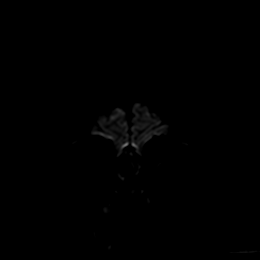

[Series 12: DWI · coronal · 4.0mm · 0.88mm/px · 2 of 32 slices shown (4 of 4)]
[im 1/32]
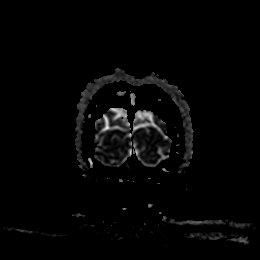
[im 32/32]
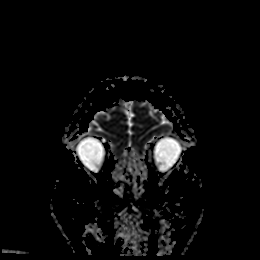

[Series 17: T1 · sagittal · 5.0mm · 0.75mm/px · 2 of 23 slices shown]
[im 1/23]
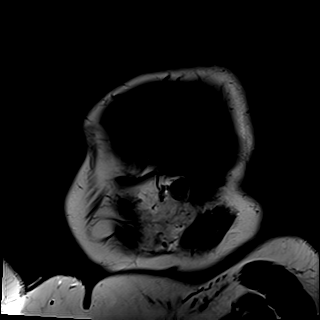
[im 23/23]
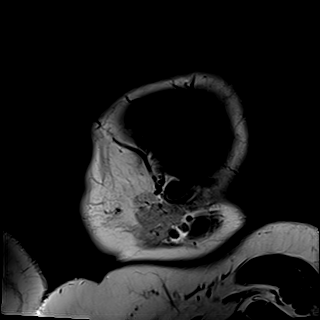

[Series 18: T2 · axial · 5.0mm · 0.72mm/px · z∈[-158,-15]mm · 2 of 25 slices shown (1 of 2)]
[im 1/25]
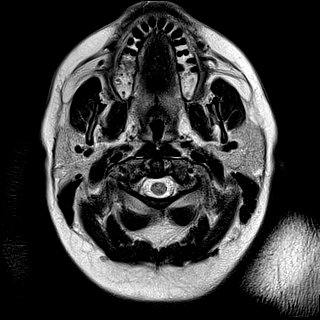
[im 25/25]
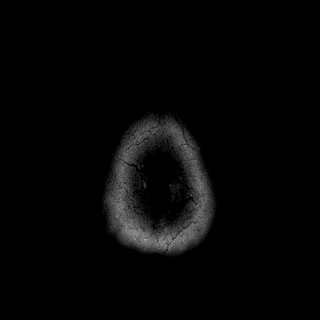

[Series 19: FLAIR · axial · 5.0mm · 0.45mm/px · z∈[-157,-15]mm · 2 of 25 slices shown]
[im 1/25]
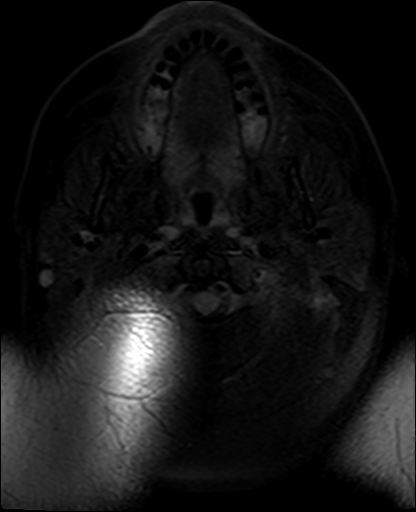
[im 25/25]
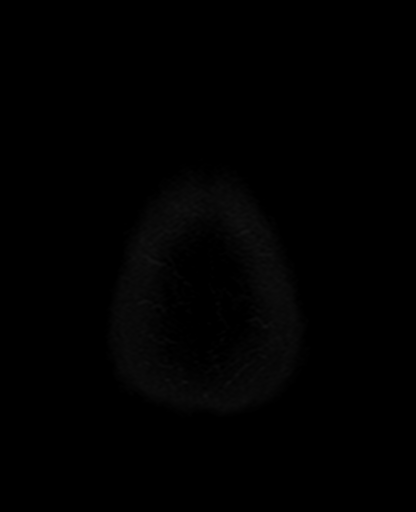

[Series 20: mag_images · axial · 3.0mm · 0.90mm/px · z∈[-163,-12]mm · 3 of 52 slices shown]
[im 1/52]
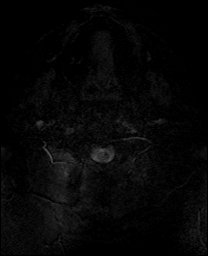
[im 26/52]
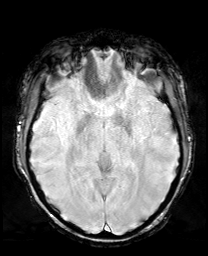
[im 52/52]
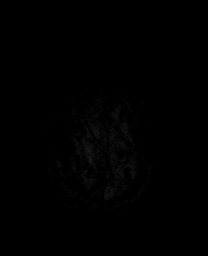

[Series 21: pha_images · axial · 3.0mm · 0.90mm/px · z∈[-163,-12]mm · 3 of 52 slices shown]
[im 1/52]
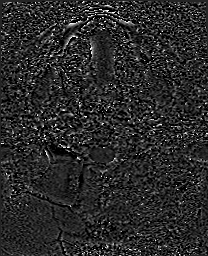
[im 26/52]
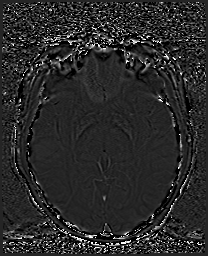
[im 52/52]
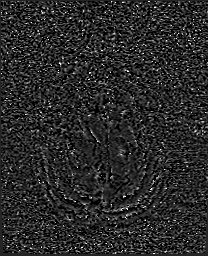

[Series 22: swi_images · axial · 3.0mm · 0.90mm/px · z∈[-163,-12]mm · 3 of 52 slices shown]
[im 1/52]
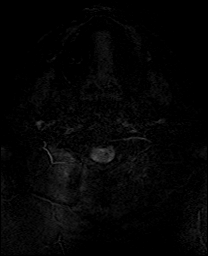
[im 26/52]
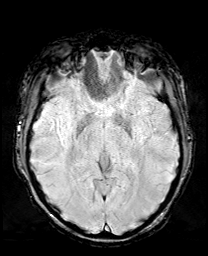
[im 52/52]
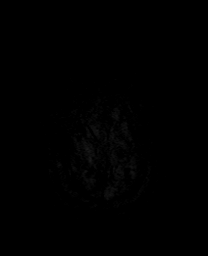

[Series 25: T2 · coronal · 5.0mm · 0.34mm/px · 2 of 29 slices shown (2 of 2)]
[im 1/29]
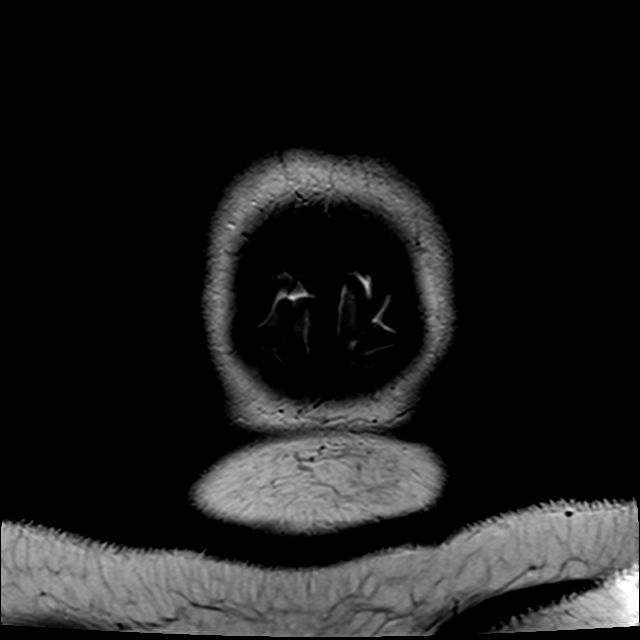
[im 29/29]
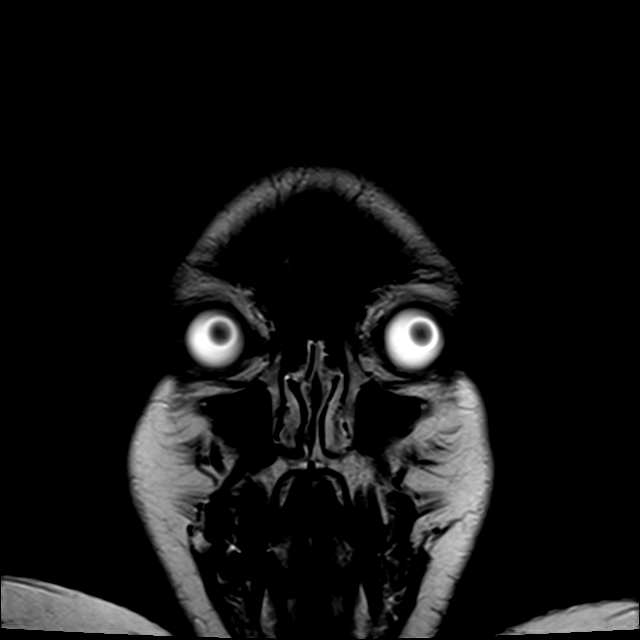

[34 of 48 positions shown; findings below may reference images not displayed]

FINDINGS: MRI HEAD FINDINGS

Brain: No acute infarct, mass effect or extra-axial collection. No
acute or chronic hemorrhage. Normal white matter signal, parenchymal
volume and CSF spaces. The midline structures are normal.

Vascular: Major flow voids are preserved.

Skull and upper cervical spine: Normal calvarium and skull base.
Visualized upper cervical spine and soft tissues are normal.

Sinuses/Orbits:No paranasal sinus fluid levels or advanced mucosal
thickening. No mastoid or middle ear effusion. Normal orbits.

MRA HEAD FINDINGS

POSTERIOR CIRCULATION:

--Vertebral arteries: Normal

--Inferior cerebellar arteries: Normal.

--Basilar artery: Normal.

--Superior cerebellar arteries: Normal.

--Posterior cerebral arteries: Normal.

ANTERIOR CIRCULATION:

--Intracranial internal carotid arteries: Normal.

--Anterior cerebral arteries (ACA): Normal.

--Middle cerebral arteries (MCA): Normal.

ANATOMIC VARIANTS: None
IMPRESSION: Normal MRI/MRA of the brain.

## 2022-08-08 ENCOUNTER — Ambulatory Visit: Payer: BC Managed Care – PPO | Admitting: Family Medicine

## 2022-08-08 ENCOUNTER — Encounter: Payer: Self-pay | Admitting: Family Medicine

## 2022-08-08 DIAGNOSIS — Z6841 Body Mass Index (BMI) 40.0 and over, adult: Secondary | ICD-10-CM

## 2022-08-08 DIAGNOSIS — I10 Essential (primary) hypertension: Secondary | ICD-10-CM | POA: Diagnosis not present

## 2022-08-08 DIAGNOSIS — Z7689 Persons encountering health services in other specified circumstances: Secondary | ICD-10-CM

## 2022-08-08 MED ORDER — SEMAGLUTIDE-WEIGHT MANAGEMENT 2.4 MG/0.75ML ~~LOC~~ SOAJ
2.4000 mg | SUBCUTANEOUS | 3 refills | Status: DC
Start: 2022-08-08 — End: 2022-12-16

## 2022-08-08 NOTE — Progress Notes (Signed)
Established Patient Office Visit   Subjective  Patient ID: Shelby Chandler, female    DOB: September 14, 1990  Age: 32 y.o. MRN: [id number]  Chief Complaint  Patient presents with   Hypertension    Follow up on HTN and medication  She states she is doing well on the [facility]  States she did have some side effects as in nausea and vomiting but that has went away     Patient is a 32 year old female who is seen for follow-up on weight.  Patient states she is doing well overall.  States mood is good.  Currently out for summer break from school.  Patient taking Wegovy 1.7 mg wkly.  Walking for exercise.  Drinking more water.  Has noticed decreased appetite.  Lost 14 pounds since April.  Denies difficulty obtaining medication.  Initially had some increased gas, nausea, loose stools with medication which is since improved.  Hypertension   Patient Active Problem List   Diagnosis Date Noted   Chronic hypertension 11/30/2020   Delivery by classical cesarean section 11/21/2020   BMI 70 and over, adult (HCC) 09/23/2020   Morbid obesity (HCC) 09/13/2020   PSVT (paroxysmal supraventricular tachycardia) 09/13/2020   Absolute anemia 11/07/2019   Vitamin B12 deficiency 07/04/2019   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   Anxiety and depression 08/28/2017   Chronic low back pain without sciatica 08/28/2017   History of anemia 08/28/2017   Essential hypertension 01/09/2017   Class 3 severe obesity due to excess calories without serious comorbidity with body mass index (BMI) of 60.0 to 69.9 in adult (HCC) 01/09/2017   Past Surgical History:  Procedure Laterality Date   CESAREAN SECTION  11/21/2020   Procedure: CESAREAN SECTION;  Surgeon: Reva Bores, MD;  Location: [facility] LD ORS;  Service: Obstetrics;;   TOOTH EXTRACTION     Social History   Tobacco Use   Smoking status: Never   Smokeless tobacco: Never  Vaping Use   Vaping Use: Never used  Substance Use Topics   Alcohol use: Not Currently   Drug  use: No   Family History  Problem Relation Age of Onset   Alcohol abuse Mother    Arthritis Mother    Depression Mother    Drug abuse Mother    Heart attack Mother    Hypertension Mother    Hyperlipidemia Mother    Alcohol abuse Father    Diabetes Father    Drug abuse Father    Hypertension Father    Heart attack Father    Stroke Father    Hypertension Sister    Arthritis Maternal Grandmother    Arthritis Maternal Grandfather    Diabetes Paternal Grandmother    No Known Allergies    ROS Negative unless stated above    Objective:     BP 130/78   Pulse 75   Temp 97.9 F (36.6 C) (Oral)   Resp 18   Ht 5\' 5"  (1.651 m)   Wt (!) 414 lb 6 oz (188 kg)   SpO2 99%   BMI 68.96 kg/m  BP Readings from Last 3 Encounters:  08/08/22 130/78  05/13/22 (!) 160/100  05/09/22 (!) 148/100   Wt Readings from Last 3 Encounters:  08/08/22 (!) 414 lb 6 oz (188 kg)  05/13/22 (!) 428 lb (194.1 kg)  05/09/22 (!) 424 lb (192.3 kg)      Physical Exam Constitutional:      General: She is not in acute distress.    Appearance: Normal appearance.  HENT:     Head: Normocephalic and atraumatic.     Nose: Nose normal.     Mouth/Throat:     Mouth: Mucous membranes are moist.  Cardiovascular:     Rate and Rhythm: Normal rate and regular rhythm.     Heart sounds: Normal heart sounds. No murmur heard.    No gallop.  Pulmonary:     Effort: Pulmonary effort is normal. No respiratory distress.     Breath sounds: Normal breath sounds. No wheezing, rhonchi or rales.  Skin:    General: Skin is warm and dry.  Neurological:     Mental Status: She is alert and oriented to person, place, and time.     No results found for any visits on 08/08/22.    Assessment & Plan:  Morbid obesity (HCC) -Body mass index is 68.96 kg/m.  BMI previously 71.22 on 05/13/2022. -wt 426 lbs 04/2022 when medication started.  Congratulated on 12 pound weight loss. -Patient encouraged to continue diet and  exercise changes. -Continue Wegovy 2.4 mg weekly. -Consider revisiting referral to weight management.  Referral was previously placed however appointment was unable to be scheduled. -     Semaglutide-Weight Management; Inject 2.4 mg into the skin once a week.  Dispense: 3 mL; Refill: 3  Essential hypertension -Controlled -Continue propranolol ER 120 mg daily -Lifestyle modifications  Encounter for weight management   Return in about 2 months (around 10/09/2022).   Deeann Saint, MD

## 2022-09-22 ENCOUNTER — Encounter (INDEPENDENT_AMBULATORY_CARE_PROVIDER_SITE_OTHER): Payer: Self-pay

## 2022-10-07 IMAGING — US US ABDOMEN LIMITED
1 series · 15 of 25 positions shown · non-contrast
Comparison: None similar

CLINICAL DATA: Right upper quadrant pain

EXAM:
ULTRASOUND ABDOMEN LIMITED RIGHT UPPER QUADRANT

[Series 1: us abdomen limited · 15 of 38 slices shown]
[im 1/38]
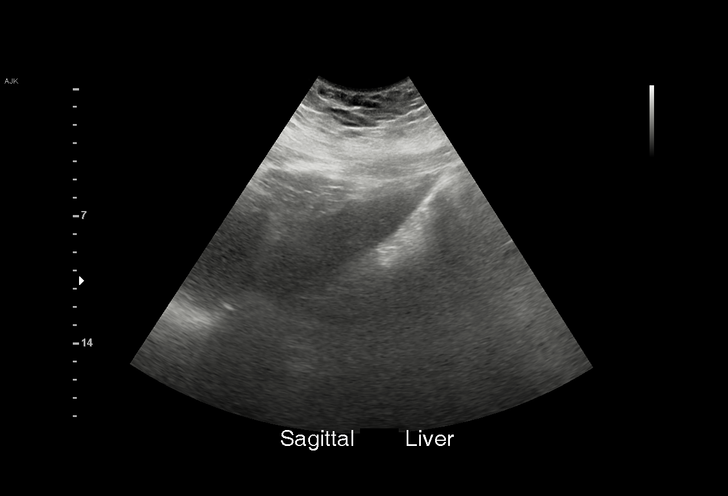
[im 4/38]
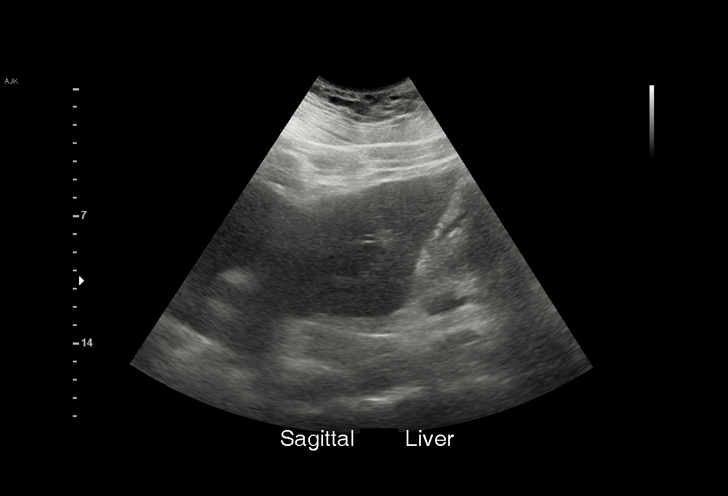
[im 7/38]
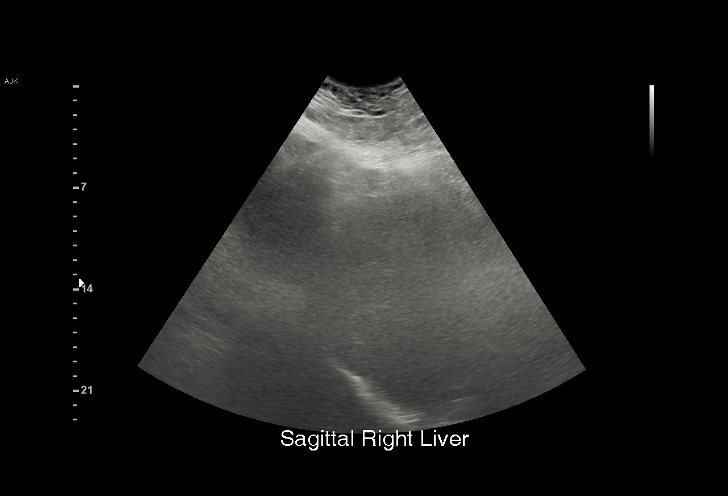
[im 8/38]
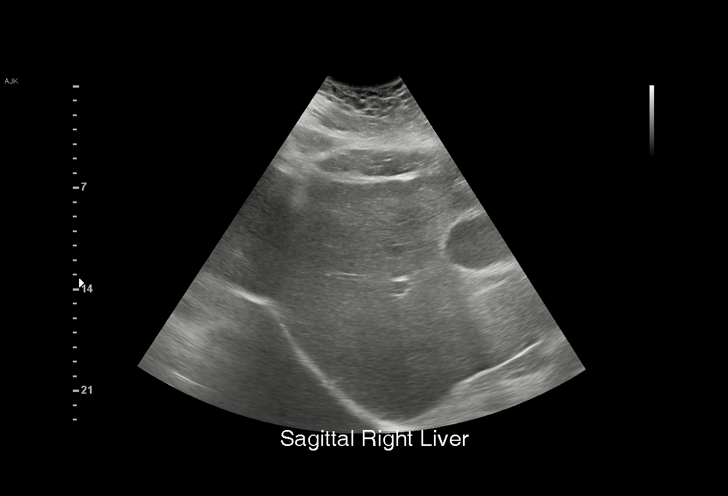
[im 11/38]
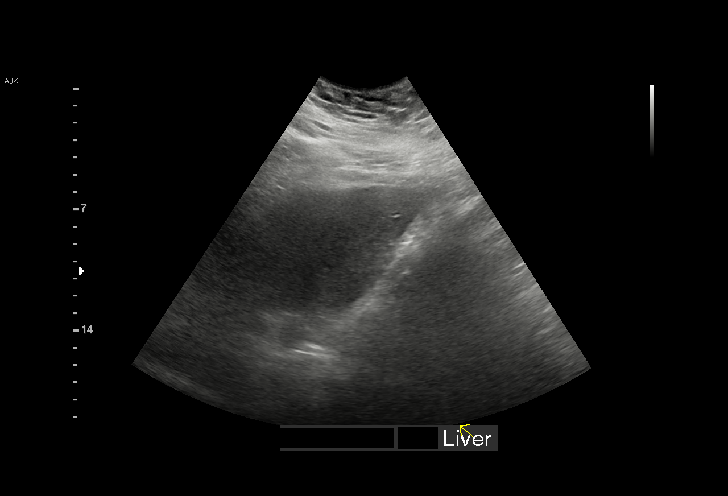
[im 14/38]
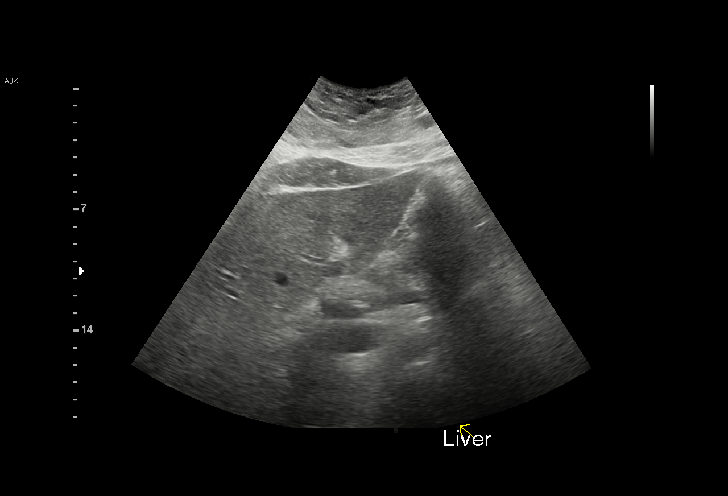
[im 16/38]
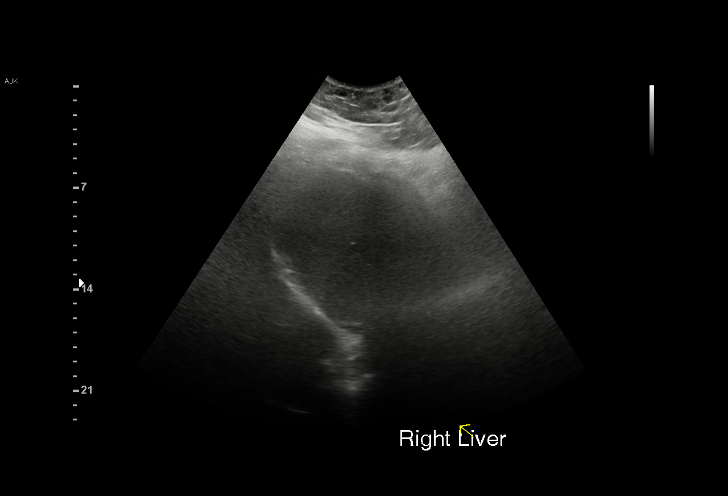
[im 19/38]
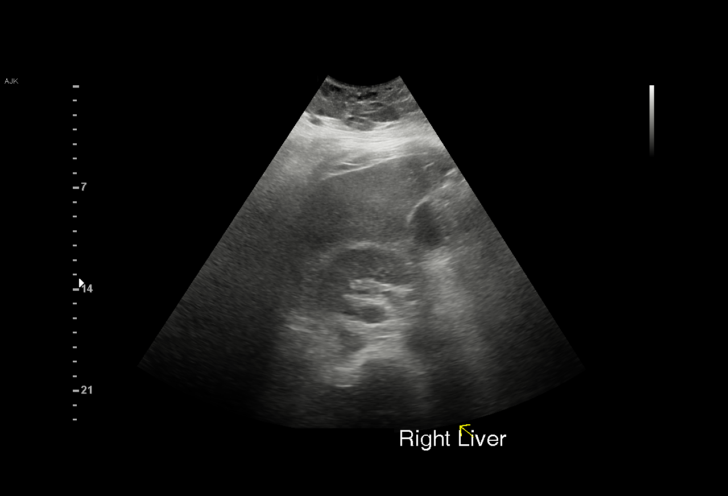
[im 22/38]
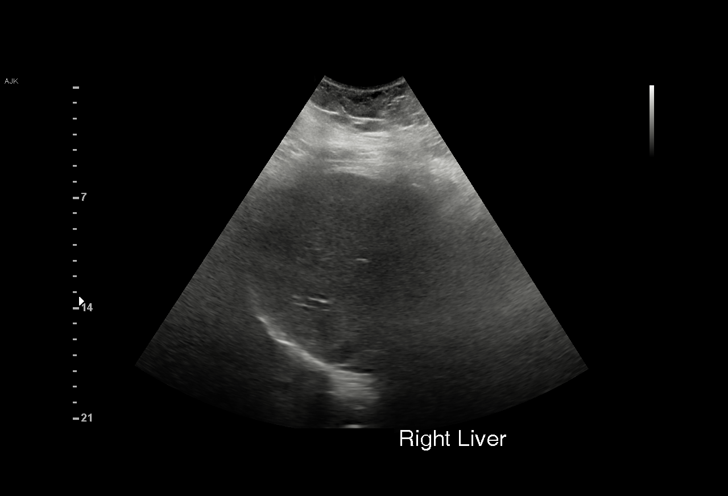
[im 24/38]
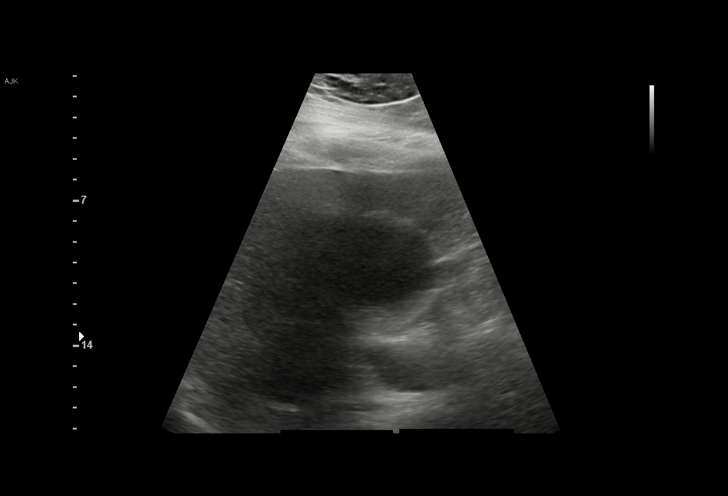
[im 27/38]
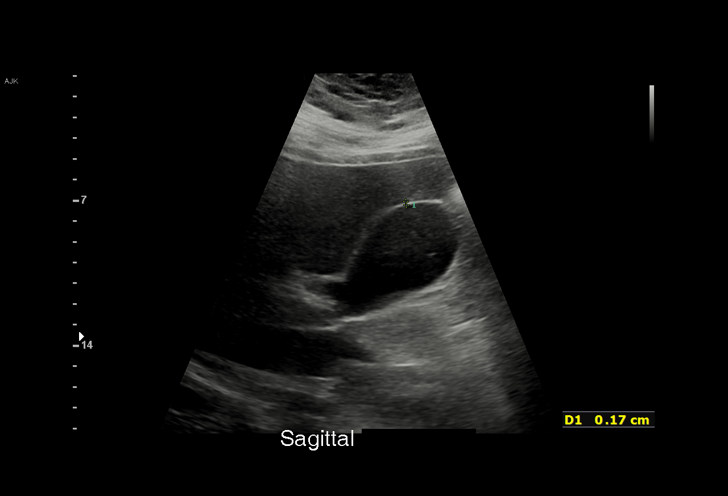
[im 30/38]
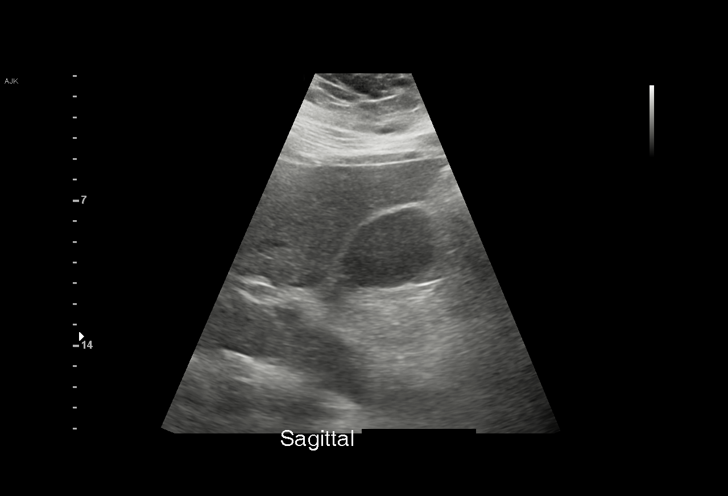
[im 31/38]
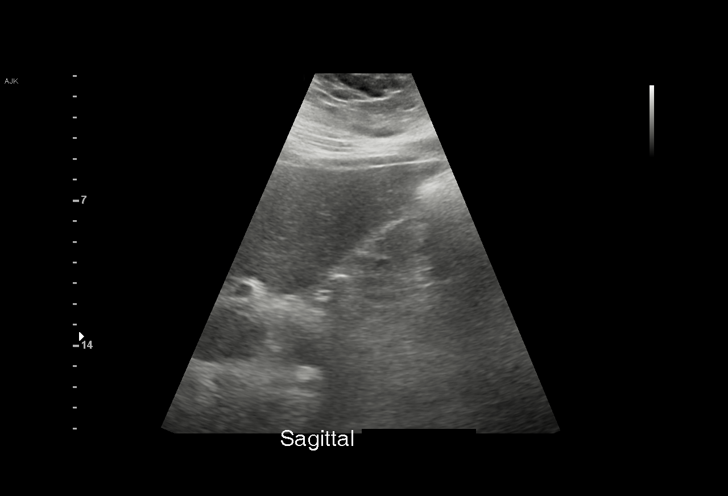
[im 34/38]
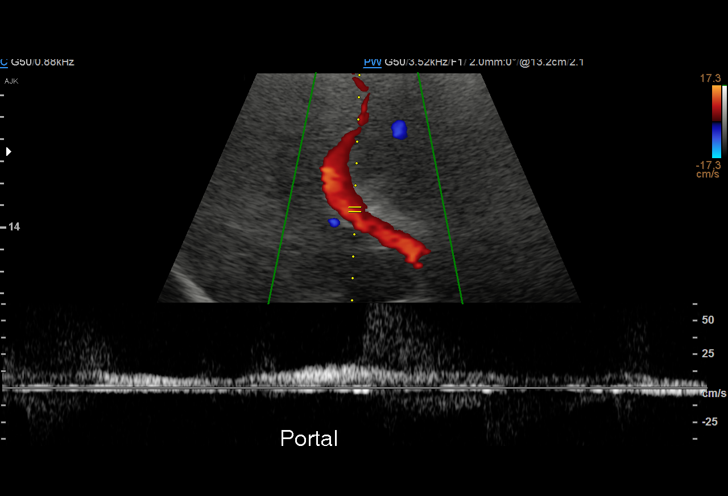
[im 38/38]
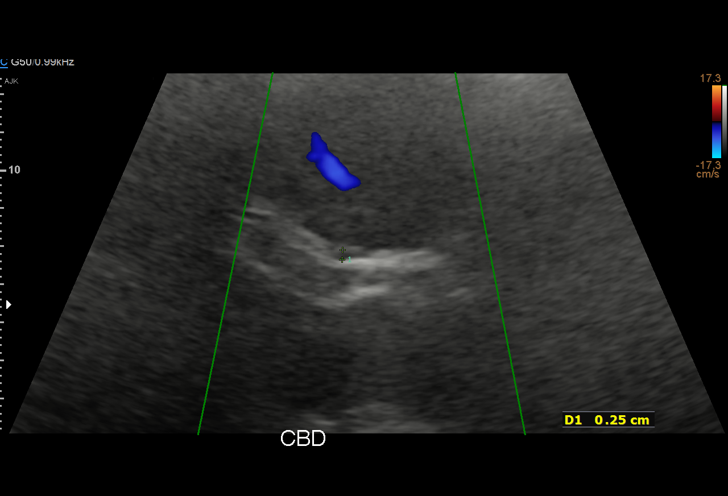

[15 of 25 positions shown; findings below may reference images not displayed]

FINDINGS: Gallbladder:

No gallstones or wall thickening visualized. No sonographic Murphy
sign noted by sonographer.

Common bile duct:

Diameter: 2 mm

Liver:

No focal lesion identified. Within normal limits in parenchymal
echogenicity. Portal vein is patent on color Doppler imaging with
normal direction of blood flow towards the liver.
IMPRESSION: Normal right upper quadrant ultrasound.

## 2022-10-12 ENCOUNTER — Ambulatory Visit: Payer: BC Managed Care – PPO | Admitting: Family Medicine

## 2022-12-16 ENCOUNTER — Encounter: Payer: Self-pay | Admitting: Family Medicine

## 2022-12-16 ENCOUNTER — Ambulatory Visit (INDEPENDENT_AMBULATORY_CARE_PROVIDER_SITE_OTHER): Payer: BC Managed Care – PPO | Admitting: Family Medicine

## 2022-12-16 VITALS — BP 150/100 | HR 84 | Temp 98.6°F | Ht 65.0 in | Wt >= 6400 oz

## 2022-12-16 DIAGNOSIS — I10 Essential (primary) hypertension: Secondary | ICD-10-CM

## 2022-12-16 DIAGNOSIS — R5383 Other fatigue: Secondary | ICD-10-CM | POA: Diagnosis not present

## 2022-12-16 DIAGNOSIS — K219 Gastro-esophageal reflux disease without esophagitis: Secondary | ICD-10-CM | POA: Diagnosis not present

## 2022-12-16 DIAGNOSIS — R0683 Snoring: Secondary | ICD-10-CM | POA: Diagnosis not present

## 2022-12-16 DIAGNOSIS — R21 Rash and other nonspecific skin eruption: Secondary | ICD-10-CM

## 2022-12-16 DIAGNOSIS — Z840 Family history of diseases of the skin and subcutaneous tissue: Secondary | ICD-10-CM

## 2022-12-16 MED ORDER — PROPRANOLOL HCL ER 120 MG PO CP24: 120.0000 mg | ORAL_CAPSULE | Freq: Every day | ORAL | 1 refills | Status: DC

## 2022-12-16 NOTE — Patient Instructions (Addendum)
A refill on your blood pressure medication was sent to your pharmacy.  Please start taking this consistently and monitor your blood pressure.  With your blood pressure this elevated your risk for stroke.

## 2022-12-16 NOTE — Progress Notes (Signed)
Established Patient Office Visit   Subjective  Patient ID: Shelby Chandler, female    DOB: 04-Jan-1991  Age: 32 y.o. MRN: [id number]  Chief Complaint  Patient presents with   Fatigue    Pt reports when she eats, she feels like she is going to fall asleep. Fall sleep fast.    Nausea    Pt c/o nausea and vomitting. Few weeks ago   Rash    Pt c/o on eyes and neck, chest. Going on for a while.    Pt accompanied by her fianc Shelby Chandler. Patient is a 32 year old female seen for ongoing concerns.  Patient endorses increased fatigue and energy x 3 weeks.  Endorses feeling sleepy after eating or with sitting.  Patient inquires if she will need iron infusion.  Patient and her fianc endorse history of snoring.  Patient often wakes up gasping at night.  Patient has also noticed increased heartburn symptoms at night with sour acid taste in mouth.  Patient with rash on eyelids, neck, and upper chest.  Appears dry, pruritic.  Pruritus worse at night.  Tried changing make up, using CeraVe, and hydrocortisone cream.  Patient denies prior changes in soaps, lotions, detergents.  Also with bilateral foot pain.  Feels like a soreness in the bottom of her feet.  Pt notes mom with history of lupus.  Patient states she has not been taking propranolol for BP.  Not checking BP at home.  No longer taking semaglutide for weight loss.  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   Anxiety and depression 08/28/2017   Chronic low back pain without sciatica 08/28/2017   History of anemia 08/28/2017   Essential hypertension 01/09/2017   Class 3 severe obesity due to  excess calories without serious comorbidity with body mass index (BMI) of 60.0 to 69.9 in adult (HCC) 01/09/2017   Past Medical History:  Diagnosis Date   Anemia    Anxiety    Hypertension    Past Surgical History:  Procedure Laterality Date   CESAREAN SECTION  11/21/2020   Procedure: CESAREAN SECTION;  Surgeon: Reva Bores, MD;  Location: [facility];  Service: Obstetrics;;   TOOTH EXTRACTION     Social History   Tobacco Use   Smoking status: Never   Smokeless tobacco: Never  Vaping Use   Vaping status: Never Used  Substance Use Topics   Alcohol use: Not Currently   Drug use: No   Family History  Problem Relation Age of Onset   Alcohol abuse Mother    Arthritis Mother    Depression Mother    Drug abuse Mother    Heart attack Mother    Hypertension Mother    Hyperlipidemia Mother    Alcohol abuse Father    Diabetes Father    Drug abuse Father    Hypertension Father    Heart attack Father    Stroke Father    Hypertension Sister    Arthritis Maternal Grandmother    Arthritis Maternal Grandfather    Diabetes Paternal Grandmother    No Known Allergies    Review of Systems  Skin:  Positive for rash.  Negative unless stated above    Objective:     BP (!) 150/100 (BP Location: Left Arm, Cuff Size: Large) Comment (Cuff Size): thigh cuff  Pulse 84   Temp 98.6 F (37 C) (Oral)   Ht 5\' 5"  (1.651 m)   Wt (!) 419 lb 3.2 oz (190.1 kg)   LMP 12/08/2022 (Exact Date)   SpO2 98%   BMI 69.76 kg/m  BP Readings from Last 3 Encounters:  12/16/22 (!) 150/100  08/08/22 130/78  05/13/22 (!) 160/100   Wt Readings from Last 3 Encounters:  12/16/22 (!) 419 lb 3.2 oz (190.1 kg)  08/08/22 (!) 414 lb 6 oz (188 kg)  05/13/22 (!) 428 lb (194.1 kg)      Physical Exam Constitutional:      General: She is not in acute distress.    Appearance: Normal appearance.  HENT:     Head: Normocephalic and atraumatic.     Nose: Nose normal.     Mouth/Throat:     Mouth:  Mucous membranes are moist.  Cardiovascular:     Rate and Rhythm: Normal rate and regular rhythm.     Heart sounds: Normal heart sounds. No murmur heard.    No gallop.  Pulmonary:     Effort: Pulmonary effort is normal. No respiratory distress.     Breath sounds: Normal breath sounds. No wheezing, rhonchi or rales.  Skin:    General: Skin is warm and dry.     Comments: Dry, hyperpigmented plaque like lesions on bilateral upper eyelids, anterior neck, and to round lesions on upper chest.  Neurological:     Mental Status: She is alert and oriented to person, place, and time.      No results found for any visits on 12/16/22.    Assessment & Plan:  Discoid rash -Discussed possible causes of rash including eczema lupus or other autoimmune disorder infection -Obtain labs screening for lupus given mother's history of same. -     CBC with Differential/Platelet; Future -     Lupus (SLE) Analysis; Future -     Sedimentation rate; Future  Other fatigue -obtain labs to evaluate for history of iron deficiency. -     Iron, TIBC and Ferritin Panel; Future -     VITAMIN D 25 Hydroxy (Vit-D Deficiency, Fractures); Future -     CBC with Differential/Platelet; Future -     Lupus (SLE) Analysis; Future -     Sedimentation rate; Future  Snoring -Concern for sleep apnea -     CBC with Differential/Platelet; Future -     Home sleep test  Gastroesophageal reflux disease, unspecified whether esophagitis present -     Comprehensive metabolic panel; Future  Family history of lupus erythematosus  Essential hypertension -Uncontrolled -Patient strongly encouraged to restart propranolol lifestyle modifications. -Patient encouraged to check BP at home and keep a log to bring with her to clinic. -Sleep study ordered as OSA may be contributing to elevation in BP -     CBC with Differential/Platelet; Future -     TSH; Future -     T4, free; Future -     Comprehensive metabolic panel; Future -      Propranolol HCl ER; Take 1 capsule (120 mg total) by mouth daily.  Dispense: 90 capsule; Refill: 1  Morbid obesity (HCC) -Body mass index is 69.76 kg/m. -Discussed the importance of lifestyle modifications   Return in about 6 weeks (around 01/27/2023).   Shelby Saint, MD

## 2022-12-17 LAB — CBC WITH DIFFERENTIAL/PLATELET
Absolute Lymphocytes: 1622 {cells}/uL (ref 850–3900)
Absolute Monocytes: 363 {cells}/uL (ref 200–950)
Basophils Absolute: 39 {cells}/uL (ref 0–200)
Basophils Relative: 0.8 %
Eosinophils Absolute: 181 {cells}/uL (ref 15–500)
Eosinophils Relative: 3.7 %
HCT: 33.3 % — ABNORMAL LOW (ref 35.0–45.0)
Hemoglobin: 9.7 g/dL — ABNORMAL LOW (ref 11.7–15.5)
MCH: 19.8 pg — ABNORMAL LOW (ref 27.0–33.0)
MCHC: 29.1 g/dL — ABNORMAL LOW (ref 32.0–36.0)
MCV: 67.8 fL — ABNORMAL LOW (ref 80.0–100.0)
MPV: 9.9 fL (ref 7.5–12.5)
Monocytes Relative: 7.4 %
Neutro Abs: 2695 {cells}/uL (ref 1500–7800)
Neutrophils Relative %: 55 %
Platelets: 474 10*3/uL — ABNORMAL HIGH (ref 140–400)
RBC: 4.91 10*6/uL (ref 3.80–5.10)
RDW: 16.8 % — ABNORMAL HIGH (ref 11.0–15.0)
Total Lymphocyte: 33.1 %
WBC: 4.9 10*3/uL (ref 3.8–10.8)

## 2022-12-17 LAB — COMPREHENSIVE METABOLIC PANEL
AG Ratio: 1.2 (calc) (ref 1.0–2.5)
ALT: 10 U/L (ref 6–29)
AST: 11 U/L (ref 10–30)
Albumin: 3.7 g/dL (ref 3.6–5.1)
Alkaline phosphatase (APISO): 60 U/L (ref 31–125)
BUN/Creatinine Ratio: 12 (calc) (ref 6–22)
BUN: 12 mg/dL (ref 7–25)
CO2: 28 mmol/L (ref 20–32)
Calcium: 8.8 mg/dL (ref 8.6–10.2)
Chloride: 104 mmol/L (ref 98–110)
Creat: 1.02 mg/dL — ABNORMAL HIGH (ref 0.50–0.97)
Globulin: 3.2 g/dL (ref 1.9–3.7)
Glucose, Bld: 82 mg/dL (ref 65–99)
Potassium: 4.1 mmol/L (ref 3.5–5.3)
Sodium: 137 mmol/L (ref 135–146)
Total Bilirubin: 0.2 mg/dL (ref 0.2–1.2)
Total Protein: 6.9 g/dL (ref 6.1–8.1)

## 2022-12-17 LAB — IRON,TIBC AND FERRITIN PANEL
%SAT: 6 % — ABNORMAL LOW (ref 16–45)
Ferritin: 6 ng/mL — ABNORMAL LOW (ref 16–154)
Iron: 22 ug/dL — ABNORMAL LOW (ref 40–190)
TIBC: 350 ug/dL (ref 250–450)

## 2022-12-17 LAB — CBC MORPHOLOGY

## 2022-12-17 LAB — T4, FREE: Free T4: 1 ng/dL (ref 0.8–1.8)

## 2022-12-17 LAB — TSH: TSH: 2.22 m[IU]/L

## 2022-12-17 LAB — VITAMIN D 25 HYDROXY (VIT D DEFICIENCY, FRACTURES): Vit D, 25-Hydroxy: 24 ng/mL — ABNORMAL LOW (ref 30–100)

## 2022-12-17 LAB — SEDIMENTATION RATE: Sed Rate: 36 mm/h — ABNORMAL HIGH (ref 0–20)

## 2022-12-21 LAB — LUPUS (SLE) ANALYSIS
Anti Nuclear Antibody (ANA): NEGATIVE
Anti-striation Abs: NEGATIVE
Complement C4, Serum: 28 mg/dL (ref 12–38)
ENA RNP Ab: <0.2 AI (ref 0.0–0.9)
ENA SM Ab Ser-aCnc: <0.2 AI (ref 0.0–0.9)
ENA SSA (RO) Ab: <0.2 AI (ref 0.0–0.9)
ENA SSB (LA) Ab: <0.2 AI (ref 0.0–0.9)
Mitochondrial Ab: <20 U (ref 0.0–20.0)
Parietal Cell Ab: 12.9 U (ref 0.0–20.0)
Scleroderma (Scl-70) (ENA) Antibody, IgG: <0.2 AI (ref 0.0–0.9)
Smooth Muscle Ab: 4 U (ref 0–19)
Thyroperoxidase Ab SerPl-aCnc: 17 [IU]/mL (ref 0–34)
dsDNA Ab: <1 [IU]/mL (ref 0–9)

## 2022-12-26 ENCOUNTER — Other Ambulatory Visit: Payer: Self-pay | Admitting: Family Medicine

## 2022-12-26 DIAGNOSIS — E559 Vitamin D deficiency, unspecified: Secondary | ICD-10-CM

## 2022-12-26 MED ORDER — VITAMIN D (ERGOCALCIFEROL) 1.25 MG (50000 UNIT) PO CAPS: 50000.0000 [IU] | ORAL_CAPSULE | ORAL | 0 refills | Status: DC

## 2022-12-28 ENCOUNTER — Telehealth: Payer: Self-pay | Admitting: Family Medicine

## 2022-12-28 NOTE — Telephone Encounter (Signed)
Asking for information as to where she can go for the iron infusion mentioned in her mychart message.

## 2023-01-15 ENCOUNTER — Other Ambulatory Visit: Payer: Self-pay | Admitting: Family Medicine

## 2023-01-15 DIAGNOSIS — Z7689 Persons encountering health services in other specified circumstances: Secondary | ICD-10-CM

## 2023-01-19 ENCOUNTER — Other Ambulatory Visit: Payer: Self-pay | Admitting: Family Medicine

## 2023-01-19 DIAGNOSIS — D508 Other iron deficiency anemias: Secondary | ICD-10-CM

## 2023-01-20 ENCOUNTER — Telehealth: Payer: Self-pay

## 2023-01-20 ENCOUNTER — Other Ambulatory Visit (HOSPITAL_COMMUNITY): Payer: Self-pay

## 2023-01-20 DIAGNOSIS — D509 Iron deficiency anemia, unspecified: Secondary | ICD-10-CM | POA: Insufficient documentation

## 2023-01-20 DIAGNOSIS — D508 Other iron deficiency anemias: Secondary | ICD-10-CM | POA: Insufficient documentation

## 2023-01-20 NOTE — Telephone Encounter (Signed)
Patient referred to infusion pharmacy team for ambulatory infusion of IV iron.  Insurance - [employer] Dx code - D50.8/D50.9 IV Iron Therapy - Venofer  Infusion appointments - Scheduling team will schedule patient as soon as possible.     Demetrius Charity, PharmD

## 2023-01-23 ENCOUNTER — Telehealth: Payer: Self-pay | Admitting: Pharmacy Technician

## 2023-01-23 NOTE — Telephone Encounter (Signed)
Auth Submission: NO AUTH NEEDED Site of care: Site of care: [facility] Payer: [employer] Medication & CPT/J Code(s) submitted: Venofer (Iron Sucrose) J1756 Route of submission (phone, fax, portal):  Phone # Fax # Auth type: Buy/Bill PB Units/visits requested: 5 Reference number:  Approval from: 01/23/23 to 03/10/23

## 2023-01-25 ENCOUNTER — Encounter: Payer: Self-pay | Admitting: Family Medicine

## 2023-01-25 ENCOUNTER — Ambulatory Visit (INDEPENDENT_AMBULATORY_CARE_PROVIDER_SITE_OTHER): Payer: BC Managed Care – PPO

## 2023-01-25 ENCOUNTER — Ambulatory Visit: Payer: BC Managed Care – PPO | Admitting: Family Medicine

## 2023-01-25 VITALS — BP 172/92 | HR 83 | Temp 98.3°F | Resp 20 | Ht 65.0 in | Wt >= 6400 oz

## 2023-01-25 DIAGNOSIS — E559 Vitamin D deficiency, unspecified: Secondary | ICD-10-CM | POA: Diagnosis not present

## 2023-01-25 DIAGNOSIS — D509 Iron deficiency anemia, unspecified: Secondary | ICD-10-CM

## 2023-01-25 DIAGNOSIS — I10 Essential (primary) hypertension: Secondary | ICD-10-CM

## 2023-01-25 DIAGNOSIS — R5383 Other fatigue: Secondary | ICD-10-CM | POA: Diagnosis not present

## 2023-01-25 DIAGNOSIS — D508 Other iron deficiency anemias: Secondary | ICD-10-CM

## 2023-01-25 MED ORDER — IRON SUCROSE 20 MG/ML IV SOLN
200.0000 mg | Freq: Once | INTRAVENOUS | Status: AC
  Administered 2023-01-25: 200 mg via INTRAVENOUS
  Filled 2023-01-25: qty 10

## 2023-01-25 MED ORDER — VITAMIN D (ERGOCALCIFEROL) 1.25 MG (50000 UNIT) PO CAPS: 50000.0000 [IU] | ORAL_CAPSULE | ORAL | 0 refills | Status: DC

## 2023-01-25 MED ORDER — ACETAMINOPHEN 325 MG PO TABS
650.0000 mg | ORAL_TABLET | Freq: Once | ORAL | Status: AC
  Administered 2023-01-25: 650 mg via ORAL
  Filled 2023-01-25: qty 2

## 2023-01-25 MED ORDER — DIPHENHYDRAMINE HCL 25 MG PO CAPS
25.0000 mg | ORAL_CAPSULE | Freq: Once | ORAL | Status: AC
  Administered 2023-01-25: 25 mg via ORAL
  Filled 2023-01-25: qty 1

## 2023-01-25 MED ORDER — PROPRANOLOL HCL ER 120 MG PO CP24: 120.0000 mg | ORAL_CAPSULE | Freq: Every day | ORAL | 1 refills | Status: DC

## 2023-01-25 NOTE — Patient Instructions (Signed)
Tried taking ferrous gluconate.  This is a different iron formulation that might be easier on your stomach.

## 2023-01-25 NOTE — Progress Notes (Signed)
Established Patient Office Visit   Subjective  Patient ID: Shelby Chandler, female    DOB: 11/04/1990  Age: 32 y.o. MRN: [id number]  Chief Complaint  Patient presents with   Weight Loss    Patient is a 32 year old female seen for follow-up.  Patient states that she is not started back on propranolol ER.  Feels like pill has a chalky taste, is hard to swallow, and often comes back up.  In the past nifedipine caused tachycardia.  Pt was unaware vitamin D Rx was at pharmacy as she did not receive a text.  Tried  OTC iron but states it comes back up.  Has an iron transfusion later today.  Wants to lose weight.  Pt's fianc cooking healthy meals and encouraging her.  Patient may eat once a day and juicing.  Previously on Wegovy and would like to restart.  Medication decreased appetite but also caused nausea at some point.  Patient was also frustrated that she did not lose significant weight like some people.  States has prescription at pharmacy.  Would like to avoid surgery.  Trying to figure out when she could exercise due to schedule.  In the past did water aerobics.  Patient also mentions seeing more darkened lesions on skin.  Autoimmune screening was negative on 12/16/2022.  ESR was mildly elevated.  Patient notes history of autoimmune disease in family (mom-SLE) and pt's daughter has thyroid dysfunction.    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   Anxiety and depression 08/28/2017   Chronic low back pain without sciatica 08/28/2017   History of anemia  08/28/2017   Essential hypertension 01/09/2017   Class 3 severe obesity due to excess calories without serious comorbidity with body mass index (BMI) of 60.0 to 69.9 in adult (HCC) 01/09/2017   Past Medical History:  Diagnosis Date   Anemia    Anxiety    Hypertension    Past Surgical History:  Procedure Laterality Date   CESAREAN SECTION  11/21/2020   Procedure: CESAREAN SECTION;  Surgeon: Reva Bores, MD;  Location: [facility];  Service: Obstetrics;;   TOOTH EXTRACTION     Social History   Tobacco Use   Smoking status: Never   Smokeless tobacco: Never  Vaping Use   Vaping status: Never Used  Substance Use Topics   Alcohol use: Not Currently   Drug use: No   Family History  Problem Relation Age of Onset   Alcohol abuse Mother    Arthritis Mother    Depression Mother    Drug abuse Mother    Heart attack Mother    Hypertension Mother    Hyperlipidemia Mother    Alcohol abuse Father    Diabetes Father    Drug abuse Father    Hypertension Father    Heart attack Father    Stroke Father    Hypertension Sister    Arthritis Maternal Grandmother    Arthritis Maternal Grandfather    Diabetes Paternal Grandmother    No Known Allergies    ROS Negative unless stated above  Objective:     BP (!) 156/110 (BP Location: Left Wrist, Patient Position: Sitting, Cuff Size: Normal) Comment: not taking Bp meds at this time  Pulse 75   Temp 98.2 F (36.8 C) (Oral)   Ht 5\' 5"  (1.651 m)   Wt (!) 423 lb 12.8 oz (192.2 kg)   LMP 01/05/2023 (Exact Date)   SpO2 99%   BMI 70.52 kg/m    Physical Exam Constitutional:      Appearance: Normal appearance. She is obese.  HENT:     Head: Normocephalic and atraumatic.     Nose: Nose normal.     Mouth/Throat:     Mouth: Mucous membranes are moist.  Eyes:     Extraocular Movements: Extraocular movements intact.     Conjunctiva/sclera: Conjunctivae normal.  Cardiovascular:     Rate and Rhythm: Normal rate.  Pulmonary:      Effort: Pulmonary effort is normal.  Skin:    General: Skin is warm and dry.  Neurological:     Mental Status: She is alert and oriented to person, place, and time. Mental status is at baseline.     No results found for any visits on 01/25/23.    Assessment & Plan:  Morbid obesity (HCC) -Body mass index is 70.52 kg/m. -Discussed the importance of lifestyle modifications including diet and exercise -Advised on the importance of medication compliance -Discussed various exercises including chair exercises, exercise videos online that patient could do for 20 to 30 minutes at home.  Also consider water aerobics on the weekends -[facility].  Patient states she has a prescription.  Essential hypertension -Uncontrolled as not currently taking medication -Discussed the importance of compliance.  Discussed techniques to help with swallowing medication -Patient encouraged to check BP at home and keep a log to return to clinic -As patient is of childbearing age and not currently on birth control trying to avoid medications unsafe in pregnancy. -Sleep study ordered at last [facility]. -     Propranolol HCl ER; Take 1 capsule (120 mg total) by mouth daily.  Dispense: 90 capsule; Refill: 1  Vitamin D deficiency -     Vitamin D (Ergocalciferol); Take 1 capsule (50,000 Units total) by mouth every 7 (seven) days.  Dispense: 12 capsule; Refill: 0  Fatigue, unspecified type -Likely multifactorial given iron deficiency, vitamin D deficiency, deconditioning, etc. -Replete vitamins as needed   Return in about 6 weeks (around 03/08/2023).   Deeann Saint, MD

## 2023-01-25 NOTE — Progress Notes (Signed)
Diagnosis: Iron Deficiency Anemia  Provider:  Chilton Greathouse MD  Procedure: IV Push  IV Type: Peripheral, IV Location: L Hand  Venofer (Iron Sucrose), Dose: 200 mg  Post Infusion IV Care: Observation period completed and Peripheral IV Discontinued  Discharge: Condition: Good, Destination: Home . AVS Provided  Performed by:  Garnette Czech, RN

## 2023-02-03 ENCOUNTER — Encounter: Payer: Self-pay | Admitting: Family Medicine

## 2023-02-03 ENCOUNTER — Ambulatory Visit (INDEPENDENT_AMBULATORY_CARE_PROVIDER_SITE_OTHER): Payer: BC Managed Care – PPO

## 2023-02-03 VITALS — BP 191/104 | HR 67 | Temp 98.1°F | Resp 16 | Ht 65.0 in | Wt >= 6400 oz

## 2023-02-03 DIAGNOSIS — D509 Iron deficiency anemia, unspecified: Secondary | ICD-10-CM

## 2023-02-03 DIAGNOSIS — D508 Other iron deficiency anemias: Secondary | ICD-10-CM

## 2023-02-03 MED ORDER — DIPHENHYDRAMINE HCL 25 MG PO CAPS
25.0000 mg | ORAL_CAPSULE | Freq: Once | ORAL | Status: AC
  Administered 2023-02-03: 25 mg via ORAL
  Filled 2023-02-03: qty 1

## 2023-02-03 MED ORDER — IRON SUCROSE 20 MG/ML IV SOLN
200.0000 mg | Freq: Once | INTRAVENOUS | Status: AC
  Administered 2023-02-03: 200 mg via INTRAVENOUS
  Filled 2023-02-03: qty 10

## 2023-02-03 MED ORDER — ACETAMINOPHEN 325 MG PO TABS
650.0000 mg | ORAL_TABLET | Freq: Once | ORAL | Status: AC
  Administered 2023-02-03: 650 mg via ORAL
  Filled 2023-02-03: qty 2

## 2023-02-03 NOTE — Progress Notes (Signed)
 Diagnosis: Iron Deficiency Anemia  Provider:  Chilton Greathouse MD  Procedure: IV Push  IV Type: Peripheral, IV Location: R Antecubital  Venofer (Iron Sucrose), Dose: 200 mg  Post Infusion IV Care: Patient declined observation and Peripheral IV Discontinued  Discharge: Condition: Good, Destination: Home . AVS Declined  Performed by:  Adriana Mccallum, RN

## 2023-02-06 ENCOUNTER — Telehealth: Payer: Self-pay

## 2023-02-06 ENCOUNTER — Ambulatory Visit (INDEPENDENT_AMBULATORY_CARE_PROVIDER_SITE_OTHER): Payer: BC Managed Care – PPO

## 2023-02-06 ENCOUNTER — Other Ambulatory Visit (HOSPITAL_COMMUNITY): Payer: Self-pay

## 2023-02-06 VITALS — BP 178/97 | HR 65 | Temp 98.2°F | Resp 20 | Ht 65.0 in | Wt >= 6400 oz

## 2023-02-06 DIAGNOSIS — D508 Other iron deficiency anemias: Secondary | ICD-10-CM

## 2023-02-06 DIAGNOSIS — D509 Iron deficiency anemia, unspecified: Secondary | ICD-10-CM | POA: Diagnosis not present

## 2023-02-06 MED ORDER — DIPHENHYDRAMINE HCL 25 MG PO CAPS
25.0000 mg | ORAL_CAPSULE | Freq: Once | ORAL | Status: AC
  Administered 2023-02-06: 25 mg via ORAL
  Filled 2023-02-06: qty 1

## 2023-02-06 MED ORDER — IRON SUCROSE 20 MG/ML IV SOLN
200.0000 mg | Freq: Once | INTRAVENOUS | Status: AC
  Administered 2023-02-06: 200 mg via INTRAVENOUS
  Filled 2023-02-06: qty 10

## 2023-02-06 MED ORDER — ACETAMINOPHEN 325 MG PO TABS
650.0000 mg | ORAL_TABLET | Freq: Once | ORAL | Status: AC
  Administered 2023-02-06: 650 mg via ORAL
  Filled 2023-02-06: qty 2

## 2023-02-06 NOTE — Progress Notes (Signed)
Diagnosis: Acute Anemia  Provider:  Chilton Greathouse MD  Procedure: IV Push  IV Type: Peripheral, IV Location: R Forearm  Venofer (Iron Sucrose), Dose: 200 mg  Post Infusion IV Care: Observation period completed and Peripheral IV Discontinued  Discharge: Condition: Good, Destination: Home . AVS Declined  Performed by:  Nat Math, RN

## 2023-02-06 NOTE — Telephone Encounter (Signed)
Pharmacy Patient Advocate Encounter   Received notification from Patient Advice Request messages that prior authorization for [facility] 0.5MG /0.5ML auto-injectors is required/requested.   Insurance verification completed.   The patient is insured through [facility] .   Per test claim: PA required; PA submitted to above mentioned insurance via CoverMyMeds Key/confirmation #/EOC [id number] Status is pending

## 2023-02-07 ENCOUNTER — Other Ambulatory Visit (HOSPITAL_COMMUNITY): Payer: Self-pay

## 2023-02-07 NOTE — Telephone Encounter (Signed)
 Pharmacy Patient Advocate Encounter  Received notification from [facility] that Prior Authorization for Wegovy  0.5MG /0.5ML auto-injectors  has been APPROVED from 02/06/23 to 06/12/23. Ran test claim, Copay is $24.99. This test claim was processed through [facility]- copay amounts may vary at other pharmacies due to pharmacy/plan contracts, or as the patient moves through the different stages of their insurance plan.   PA #/Case ID/Reference #: [id number]

## 2023-02-09 ENCOUNTER — Ambulatory Visit: Payer: BC Managed Care – PPO

## 2023-02-09 ENCOUNTER — Other Ambulatory Visit: Payer: Self-pay | Admitting: Family Medicine

## 2023-02-09 VITALS — BP 161/92 | HR 70 | Temp 98.2°F | Resp 20 | Ht 65.0 in | Wt >= 6400 oz

## 2023-02-09 DIAGNOSIS — D508 Other iron deficiency anemias: Secondary | ICD-10-CM

## 2023-02-09 DIAGNOSIS — D509 Iron deficiency anemia, unspecified: Secondary | ICD-10-CM | POA: Diagnosis not present

## 2023-02-09 DIAGNOSIS — Z7689 Persons encountering health services in other specified circumstances: Secondary | ICD-10-CM

## 2023-02-09 MED ORDER — IRON SUCROSE 20 MG/ML IV SOLN
200.0000 mg | Freq: Once | INTRAVENOUS | Status: AC
  Administered 2023-02-09: 200 mg via INTRAVENOUS
  Filled 2023-02-09: qty 10

## 2023-02-09 MED ORDER — DIPHENHYDRAMINE HCL 25 MG PO CAPS
25.0000 mg | ORAL_CAPSULE | Freq: Once | ORAL | Status: AC
  Administered 2023-02-09: 25 mg via ORAL
  Filled 2023-02-09: qty 1

## 2023-02-09 MED ORDER — ACETAMINOPHEN 325 MG PO TABS
650.0000 mg | ORAL_TABLET | Freq: Once | ORAL | Status: AC
  Administered 2023-02-09: 650 mg via ORAL
  Filled 2023-02-09: qty 2

## 2023-02-09 NOTE — Progress Notes (Signed)
 Diagnosis: Acute Anemia  Provider:  Chilton Greathouse MD  Procedure: IV Push  IV Type: Peripheral, IV Location: L Forearm  Venofer (Iron Sucrose), Dose: 200 mg  Post Infusion IV Care: Patient declined observation and Peripheral IV Discontinued  Discharge: Condition: Good, Destination: Home . AVS Declined  Performed by:  Nat Math, RN

## 2023-02-13 ENCOUNTER — Ambulatory Visit: Payer: BC Managed Care – PPO

## 2023-02-13 ENCOUNTER — Other Ambulatory Visit: Payer: Self-pay | Admitting: Family Medicine

## 2023-02-13 VITALS — BP 164/78 | HR 64 | Temp 97.8°F | Resp 20

## 2023-02-13 DIAGNOSIS — D509 Iron deficiency anemia, unspecified: Secondary | ICD-10-CM | POA: Diagnosis not present

## 2023-02-13 DIAGNOSIS — D508 Other iron deficiency anemias: Secondary | ICD-10-CM

## 2023-02-13 MED ORDER — SEMAGLUTIDE-WEIGHT MANAGEMENT 0.25 MG/0.5ML ~~LOC~~ SOAJ: 0.2500 mg | SUBCUTANEOUS | 0 refills | Status: DC

## 2023-02-13 MED ORDER — ACETAMINOPHEN 325 MG PO TABS
650.0000 mg | ORAL_TABLET | Freq: Once | ORAL | Status: AC
  Administered 2023-02-13: 650 mg via ORAL
  Filled 2023-02-13: qty 2

## 2023-02-13 MED ORDER — SEMAGLUTIDE-WEIGHT MANAGEMENT 0.5 MG/0.5ML ~~LOC~~ SOAJ: 0.5000 mg | SUBCUTANEOUS | 0 refills | Status: DC

## 2023-02-13 MED ORDER — IRON SUCROSE 20 MG/ML IV SOLN
200.0000 mg | Freq: Once | INTRAVENOUS | Status: AC
  Administered 2023-02-13: 200 mg via INTRAVENOUS
  Filled 2023-02-13: qty 10

## 2023-02-13 MED ORDER — DIPHENHYDRAMINE HCL 25 MG PO CAPS
25.0000 mg | ORAL_CAPSULE | Freq: Once | ORAL | Status: AC
  Administered 2023-02-13: 25 mg via ORAL
  Filled 2023-02-13: qty 1

## 2023-02-13 MED ORDER — SEMAGLUTIDE-WEIGHT MANAGEMENT 1 MG/0.5ML ~~LOC~~ SOAJ: 1.0000 mg | SUBCUTANEOUS | 1 refills | Status: DC

## 2023-02-13 NOTE — Progress Notes (Signed)
 Diagnosis: Iron  Deficiency Anemia  Provider:  Mannam, Praveen MD  Procedure: IV Push  IV Type: Peripheral, IV Location: R Forearm  Venofer  (Iron  Sucrose), Dose: 200 mg  Post Infusion IV Care: Observation period completed and Peripheral IV Discontinued  Discharge: Condition: Good, Destination: Home . AVS Declined  Performed by:  Rocky FORBES Sar, RN

## 2023-02-14 ENCOUNTER — Encounter (HOSPITAL_BASED_OUTPATIENT_CLINIC_OR_DEPARTMENT_OTHER): Payer: Self-pay | Admitting: Emergency Medicine

## 2023-02-14 ENCOUNTER — Emergency Department (HOSPITAL_BASED_OUTPATIENT_CLINIC_OR_DEPARTMENT_OTHER): Payer: BC Managed Care – PPO

## 2023-02-14 ENCOUNTER — Other Ambulatory Visit: Payer: Self-pay

## 2023-02-14 ENCOUNTER — Emergency Department (HOSPITAL_BASED_OUTPATIENT_CLINIC_OR_DEPARTMENT_OTHER)
Admission: EM | Admit: 2023-02-14 | Discharge: 2023-02-14 | Disposition: A | Discharge status: Home / Self Care | Payer: BC Managed Care – PPO | Attending: Emergency Medicine | Admitting: Emergency Medicine

## 2023-02-14 DIAGNOSIS — N12 Tubulo-interstitial nephritis, not specified as acute or chronic: Secondary | ICD-10-CM | POA: Diagnosis not present

## 2023-02-14 DIAGNOSIS — K760 Fatty (change of) liver, not elsewhere classified: Secondary | ICD-10-CM | POA: Diagnosis not present

## 2023-02-14 DIAGNOSIS — I10 Essential (primary) hypertension: Secondary | ICD-10-CM | POA: Insufficient documentation

## 2023-02-14 DIAGNOSIS — Z79899 Other long term (current) drug therapy: Secondary | ICD-10-CM | POA: Insufficient documentation

## 2023-02-14 DIAGNOSIS — R1031 Right lower quadrant pain: Secondary | ICD-10-CM | POA: Diagnosis not present

## 2023-02-14 DIAGNOSIS — K5730 Diverticulosis of large intestine without perforation or abscess without bleeding: Secondary | ICD-10-CM | POA: Diagnosis not present

## 2023-02-14 DIAGNOSIS — R1030 Lower abdominal pain, unspecified: Secondary | ICD-10-CM | POA: Diagnosis not present

## 2023-02-14 LAB — PREGNANCY, URINE: Preg Test, Ur: NEGATIVE

## 2023-02-14 LAB — COMPREHENSIVE METABOLIC PANEL
ALT: 14 U/L (ref 0–44)
AST: 12 U/L — ABNORMAL LOW (ref 15–41)
Albumin: 3.9 g/dL (ref 3.5–5.0)
Alkaline Phosphatase: 44 U/L (ref 38–126)
Anion gap: 10 (ref 5–15)
BUN: 8 mg/dL (ref 6–20)
CO2: 25 mmol/L (ref 22–32)
Calcium: 8.7 mg/dL — ABNORMAL LOW (ref 8.9–10.3)
Chloride: 103 mmol/L (ref 98–111)
Creatinine, Ser: 0.82 mg/dL (ref 0.44–1.00)
GFR, Estimated: >60 mL/min (ref >60)
Glucose, Bld: 86 mg/dL (ref 70–99)
Potassium: 3.5 mmol/L (ref 3.5–5.1)
Sodium: 138 mmol/L (ref 135–145)
Total Bilirubin: 0.6 mg/dL (ref 0.0–1.2)
Total Protein: 7.6 g/dL (ref 6.5–8.1)

## 2023-02-14 LAB — URINALYSIS, W/ REFLEX TO CULTURE (INFECTION SUSPECTED)
Bilirubin Urine: NEGATIVE
Glucose, UA: NEGATIVE mg/dL
Ketones, ur: NEGATIVE mg/dL
Nitrite: POSITIVE — AB
Protein, ur: 30 mg/dL — AB
Specific Gravity, Urine: 1.009 (ref 1.005–1.030)
WBC, UA: >50 WBC/hpf (ref 0–5)
pH: 6 (ref 5.0–8.0)

## 2023-02-14 LAB — CBC WITH DIFFERENTIAL/PLATELET
Abs Immature Granulocytes: 0.03 10*3/uL (ref 0.00–0.07)
Basophils Absolute: 0 10*3/uL (ref 0.0–0.1)
Basophils Relative: 0 %
Eosinophils Absolute: 0 10*3/uL (ref 0.0–0.5)
Eosinophils Relative: 0 %
HCT: 34 % — ABNORMAL LOW (ref 36.0–46.0)
Hemoglobin: 10.3 g/dL — ABNORMAL LOW (ref 12.0–15.0)
Immature Granulocytes: 0 %
Lymphocytes Relative: 10 %
Lymphs Abs: 0.9 10*3/uL (ref 0.7–4.0)
MCH: 21.1 pg — ABNORMAL LOW (ref 26.0–34.0)
MCHC: 30.3 g/dL (ref 30.0–36.0)
MCV: 69.8 fL — ABNORMAL LOW (ref 80.0–100.0)
Monocytes Absolute: 1 10*3/uL (ref 0.1–1.0)
Monocytes Relative: 11 %
Neutro Abs: 7.1 10*3/uL (ref 1.7–7.7)
Neutrophils Relative %: 79 %
Platelets: 324 10*3/uL (ref 150–400)
RBC: 4.87 MIL/uL (ref 3.87–5.11)
RDW: 21.1 % — ABNORMAL HIGH (ref 11.5–15.5)
WBC: 9.1 10*3/uL (ref 4.0–10.5)
nRBC: 0 % (ref 0.0–0.2)

## 2023-02-14 LAB — LIPASE, BLOOD: Lipase: <10 U/L — ABNORMAL LOW (ref 11–51)

## 2023-02-14 MED ORDER — SODIUM CHLORIDE 0.9 % IV SOLN
2.0000 g | Freq: Once | INTRAVENOUS | Status: AC
  Administered 2023-02-14: 2 g via INTRAVENOUS
  Filled 2023-02-14: qty 20

## 2023-02-14 MED ORDER — ONDANSETRON HCL 4 MG/2ML IJ SOLN
4.0000 mg | Freq: Once | INTRAMUSCULAR | Status: AC
  Administered 2023-02-14: 4 mg via INTRAVENOUS
  Filled 2023-02-14: qty 2

## 2023-02-14 MED ORDER — METOCLOPRAMIDE HCL 5 MG/ML IJ SOLN
10.0000 mg | Freq: Once | INTRAMUSCULAR | Status: AC
  Administered 2023-02-14: 10 mg via INTRAVENOUS
  Filled 2023-02-14: qty 2

## 2023-02-14 MED ORDER — PROPRANOLOL HCL ER 120 MG PO CP24: 120.0000 mg | ORAL_CAPSULE | Freq: Every day | ORAL | Status: DC

## 2023-02-14 MED ORDER — HYDROMORPHONE HCL 1 MG/ML IJ SOLN
0.5000 mg | Freq: Once | INTRAMUSCULAR | Status: AC
  Administered 2023-02-14: 0.5 mg via INTRAVENOUS
  Filled 2023-02-14: qty 1

## 2023-02-14 MED ORDER — CIPROFLOXACIN HCL 500 MG PO TABS: 500.0000 mg | ORAL_TABLET | Freq: Two times a day (BID) | ORAL | 0 refills | Status: AC

## 2023-02-14 MED ORDER — IOHEXOL 350 MG/ML SOLN
100.0000 mL | Freq: Once | INTRAVENOUS | Status: AC | PRN
  Administered 2023-02-14: 100 mL via INTRAVENOUS

## 2023-02-14 MED ORDER — MORPHINE SULFATE (PF) 4 MG/ML IV SOLN
4.0000 mg | Freq: Once | INTRAVENOUS | Status: AC
Start: 2023-02-14 — End: 2023-02-14
  Administered 2023-02-14: 4 mg via INTRAVENOUS
  Filled 2023-02-14: qty 1

## 2023-02-14 MED ORDER — ONDANSETRON 4 MG PO TBDP: 4.0000 mg | ORAL_TABLET | Freq: Three times a day (TID) | ORAL | 0 refills | Status: DC | PRN

## 2023-02-14 MED ORDER — LABETALOL HCL 100 MG PO TABS
150.0000 mg | ORAL_TABLET | Freq: Once | ORAL | Status: AC
  Administered 2023-02-14: 150 mg via ORAL
  Filled 2023-02-14: qty 2

## 2023-02-14 MED ORDER — NAPROXEN 500 MG PO TABS: 500.0000 mg | ORAL_TABLET | Freq: Two times a day (BID) | ORAL | 0 refills | Status: DC

## 2023-02-14 NOTE — ED Provider Notes (Signed)
 [place] EMERGENCY DEPARTMENT AT [facility] Provider Note   CSN: [id number] Arrival date & time: 02/14/23  0935     History  Chief Complaint  Patient presents with   Abdominal Pain    Shelby Chandler is a 33 y.o. female.  With a history of hypertension, anxiety, anemia presenting to the ED for evaluation of right-sided flank and lower abdominal pain since yesterday morning shortly after waking up.  Pain is initially in the right pelvis.  Pain eventually radiated up to the right abdomen, into the right low back.  She reports some nausea but no vomiting.  She reports significant urinary frequency.  No dysuria or urgency.  No fevers.  No diarrhea.  No vaginal discharge, itching, odor.  Pain is constant, described as an aching sensation.  No aggravating or alleviating factors.  Patient also reports she has not taken her antihypertensive today.   Abdominal Pain Associated symptoms: nausea        Home Medications Prior to Admission medications   Medication Sig Start Date End Date Taking? Authorizing Provider  propranolol  ER (INDERAL  LA) 120 MG 24 hr capsule Take 1 capsule (120 mg total) by mouth daily. 01/25/23  Yes [name], MD  ciprofloxacin  (CIPRO ) 500 MG tablet Take 1 tablet (500 mg total) by mouth every 12 (twelve) hours for 7 days. 02/14/23 02/21/23 Yes Jakeisha Stricker, Marsa HERO, PA-C  fluticasone  (FLONASE ) 50 MCG/ACT nasal spray SPRAY 2 SPRAYS INTO EACH NOSTRIL EVERY DAY 01/18/23   [name], MD  levocetirizine (XYZAL ) 5 MG tablet TAKE 1 TABLET BY MOUTH EVERY DAY IN THE EVENING 06/28/21   [name], MD  naproxen  (NAPROSYN ) 500 MG tablet Take 1 tablet (500 mg total) by mouth 2 (two) times daily. 02/14/23  Yes Kelsei Defino, Marsa HERO, PA-C  ondansetron  (ZOFRAN -ODT) 4 MG disintegrating tablet Take 1 tablet (4 mg total) by mouth every 8 (eight) hours as needed for nausea or vomiting. 02/14/23  Yes Enma Maeda, Marsa HERO, PA-C  Semaglutide -Weight Management 0.25  MG/0.5ML SOAJ Inject 0.25 mg into the skin once a week for 28 days. 02/13/23 03/13/23  [name], MD  Semaglutide -Weight Management 0.5 MG/0.5ML SOAJ Inject 0.5 mg into the skin once a week for 28 days. 03/14/23 04/11/23  [name], MD  Semaglutide -Weight Management 1 MG/0.5ML SOAJ Inject 1 mg into the skin once a week. 04/12/23   [name], MD  Vitamin D , Ergocalciferol , (DRISDOL ) 1.25 MG (50000 UNIT) CAPS capsule Take 1 capsule (50,000 Units total) by mouth every 7 (seven) days. 01/25/23   [name], MD      Allergies    Patient has no known allergies.    Review of Systems   Review of Systems  Gastrointestinal:  Positive for abdominal pain and nausea.  Genitourinary:  Positive for frequency.  All other systems reviewed and are negative.   Physical Exam Updated Vital Signs BP (!) 162/97   Pulse 79   Temp 98.9 F (37.2 C)   Resp 20   Ht 5' 5 (1.651 m)   Wt (!) 195 kg   LMP 01/05/2023 (Exact Date)   SpO2 97%   BMI 71.54 kg/m  Physical Exam Vitals and nursing note reviewed.  Constitutional:      Appearance: Normal appearance. She is obese. She is not ill-appearing.     Comments: Resting in bed, appears uncomfortable  HENT:     Head: Normocephalic and atraumatic.  Pulmonary:     Effort: Pulmonary effort is  normal. No respiratory distress.  Abdominal:     General: Abdomen is flat.     Tenderness: There is abdominal tenderness in the right lower quadrant and suprapubic area. There is right CVA tenderness and guarding. There is no left CVA tenderness.  Musculoskeletal:        General: Normal range of motion.     Cervical back: Neck supple.  Skin:    General: Skin is warm and dry.  Neurological:     Mental Status: She is alert and oriented to person, place, and time.  Psychiatric:        Mood and Affect: Mood normal.        Behavior: Behavior normal.     ED Results / Procedures / Treatments   Labs (all labs ordered are listed, but only abnormal  results are displayed) Labs Reviewed  CBC WITH DIFFERENTIAL/PLATELET - Abnormal; Notable for the following components:      Result Value   Hemoglobin 10.3 (*)    HCT 34.0 (*)    MCV 69.8 (*)    MCH 21.1 (*)    RDW 21.1 (*)    All other components within normal limits  COMPREHENSIVE METABOLIC PANEL - Abnormal; Notable for the following components:   Calcium  8.7 (*)    AST 12 (*)    All other components within normal limits  LIPASE, BLOOD - Abnormal; Notable for the following components:   Lipase <10 (*)    All other components within normal limits  URINALYSIS, W/ REFLEX TO CULTURE (INFECTION SUSPECTED) - Abnormal; Notable for the following components:   APPearance HAZY (*)    Hgb urine dipstick MODERATE (*)    Protein, ur 30 (*)    Nitrite POSITIVE (*)    Leukocytes,Ua LARGE (*)    Bacteria, UA MANY (*)    All other components within normal limits  URINE CULTURE  PREGNANCY, URINE    EKG None  Radiology CT ABDOMEN PELVIS W CONTRAST Result Date: 02/14/2023 CLINICAL DATA:  Right lower quadrant abdominal pain. EXAM: CT ABDOMEN AND PELVIS WITH CONTRAST TECHNIQUE: Multidetector CT imaging of the abdomen and pelvis was performed using the standard protocol following bolus administration of intravenous contrast. RADIATION DOSE REDUCTION: This exam was performed according to the departmental dose-optimization program which includes automated exposure control, adjustment of the mA and/or kV according to patient size and/or use of iterative reconstruction technique. CONTRAST:  OMNIPAQUE  IOHEXOL  350 MG/ML SOLN COMPARISON:  None Available. FINDINGS: Evaluation of this exam is limited in the absence of intravenous contrast. Lower chest: The visualized lung bases are clear. No intra-abdominal free air or free fluid. Hepatobiliary: Fatty liver. No biliary dilatation. The gallbladder is unremarkable. Pancreas: Unremarkable. No pancreatic ductal dilatation or surrounding inflammatory changes.  Spleen: Normal in size without focal abnormality. Adrenals/Urinary Tract: The adrenal glands unremarkable. There is no hydronephrosis on either side. There is symmetric enhancement and excretion of contrast by both kidneys. The visualized ureters and urinary bladder appear unremarkable. Stomach/Bowel: Several small scattered colonic diverticula. There is no bowel obstruction or active inflammation. The appendix is normal. Vascular/Lymphatic: The abdominal aorta and IVC are unremarkable. No portal venous gas. There is no adenopathy. Reproductive: The uterus is anteverted and grossly unremarkable. No suspicious adnexal masses. Other: None Musculoskeletal: No acute or significant osseous findings. IMPRESSION: 1. No acute intra-abdominal or pelvic pathology. 2. Fatty liver. 3. Mild colonic diverticulosis. No bowel obstruction. Normal appendix. Electronically Signed   By: Vanetta Chou M.D.   On: 02/14/2023 13:55  Procedures Procedures    Medications Ordered in ED Medications  morphine  (PF) 4 MG/ML injection 4 mg (4 mg Intravenous Given 02/14/23 1218)  ondansetron  (ZOFRAN ) injection 4 mg (4 mg Intravenous Given 02/14/23 1215)  labetalol  (NORMODYNE ) tablet 150 mg (150 mg Oral Given 02/14/23 1229)  cefTRIAXone  (ROCEPHIN ) 2 g in sodium chloride  0.9 % 100 mL IVPB (0 g Intravenous Stopped 02/14/23 1448)  iohexol  (OMNIPAQUE ) 350 MG/ML injection 100 mL (100 mLs Intravenous Contrast Given 02/14/23 1329)  HYDROmorphone  (DILAUDID ) injection 0.5 mg (0.5 mg Intravenous Given 02/14/23 1359)  metoCLOPramide  (REGLAN ) injection 10 mg (10 mg Intravenous Given 02/14/23 1359)    ED Course/ Medical Decision Making/ A&P                                 Medical Decision Making Amount and/or Complexity of Data Reviewed Labs: ordered. Radiology: ordered.  Risk Prescription drug management.  This patient presents to the ED for concern of right flank pain, urinary frequency, this involves an extensive number of treatment  options, and is a complaint that carries with it a high risk of complications and morbidity.  The differential diagnosis of emergent flank pain includes, but is not limited to :Abdominal aortic aneurysm,, Renal artery embolism,Renal vein thrombosis, Aortic dissection, Mesenteric ischemia, Pyelonephritis, Renal infarction, Renal hemorrhage, Nephrolithiasis/ Renal Colic, Bladder tumor,Cystitis, Biliary colic, Pancreatitis Perforated peptic ulcer Appendicitis ,Inguinal Hernia, Diverticulitis, Bowel obstruction Ectopic Pregnancy,PID/TOA,Ovarian cyst, Ovarian torsion Shingles Lower lobe pneumonia, Retroperitoneal hematoma/abscess/tumor, Epidural abscess, Epidural hematoma   My initial workup includes labs, imaging, symptom control, home BP medication  Additional history obtained from: Nursing notes from this visit.  I ordered, reviewed and interpreted labs which include: CBC, CMP, lipase, urinalysis, urine pregnancy.  No leukocytosis, improved hemoglobin from baseline at 10.3.  No significant electrolyte derangement or kidney dysfunction.  Lipase not elevated.  Urine pregnancy negative.  Urinalysis with nitrite positive, large leukocytes, greater than 50 white blood cells, many bacteria.  Urine culture sent.  I ordered imaging studies including CT abdomen pelvis I independently visualized and interpreted imaging which showed negative I agree with the radiologist interpretation  Afebrile, hypertensive but otherwise hemodynamically stable.  34 year old female presenting to the ED for evaluation of right lower quadrant abdominal pain.  Symptoms began yesterday.  She reports significant urinary frequency as well.  No fevers or chills.  Some nausea but no vomiting.  She has not taken her antihypertensives today.  She is only on propranolol  ER for this.  Lab workup revealed an obvious urinary tract infection.  Labs are otherwise reassuring.  CT abdomen pelvis reassuring.  She reports significant improvement in  her symptoms after treatment in the emergency department.  She was able to tolerate oral intake without difficulty.  Overall clinical picture consistent with pyelonephritis with significant abdominal pain and right CVA tenderness.  She was given a dose of IV Rocephin  in the emergency department.  Believe she is a good candidate for outpatient therapy.  Was to be sent prescription for ciprofloxacin .  She was also sent prescriptions for Naprosyn  and Zofran  for her symptoms.  She was encouraged to follow-up with her primary care provider soon as possible for reevaluation of her symptoms and for reevaluation of her blood pressure.  She was given strict return precautions.  Stable at discharge.  At this time there does not appear to be any evidence of an acute emergency medical condition and the patient appears stable for discharge  with appropriate outpatient follow up. Diagnosis was discussed with patient who verbalizes understanding of care plan and is agreeable to discharge. I have discussed return precautions with patient who verbalizes understanding. Patient encouraged to follow-up with their PCP within 1 week. All questions answered.  Note: Portions of this report may have been transcribed using voice recognition software. Every effort was made to ensure accuracy; however, inadvertent computerized transcription errors may still be present.        Final Clinical Impression(s) / ED Diagnoses Final diagnoses:  Pyelonephritis  Uncontrolled hypertension    Rx / DC Orders ED Discharge Orders          Ordered    ondansetron  (ZOFRAN -ODT) 4 MG disintegrating tablet  Every 8 hours PRN        02/14/23 1456    naproxen  (NAPROSYN ) 500 MG tablet  2 times daily        02/14/23 1456    ciprofloxacin  (CIPRO ) 500 MG tablet  Every 12 hours        02/14/23 1456              Edwardo Marsa HERO, PA-C 02/14/23 1513    Emil Share, DO 02/15/23 [phone]

## 2023-02-14 NOTE — Discharge Instructions (Signed)
 You have been seen today for your complaint of abdominal pain. Your lab work showed a urinary tract infection. Your imaging was reassuring. Your discharge medications include ciprofloxacin . This is an antibiotic. You should take it as prescribed. You should take it for the entire duration of the prescription. This may cause an upset stomach. This is normal. You may take this with food. You may also eat yogurt to prevent diarrhea. Zofran .  This is a nausea medicine.  Take it as needed. Naprosyn .  This is a pain medication.  Take it twice a day as needed for pain.  You may also take Tylenol  with this, 1000 mg every 6 hours. Home care instructions are as follows:  Drink plenty of water Follow up with: Your primary care provider as soon as possible for reevaluation of your symptoms and your blood pressure Please seek immediate medical care if you develop any of the following symptoms: You vomit each time that you eat or drink. You have severe pain in your back or side. You are very weak, or you faint. At this time there does not appear to be the presence of an emergent medical condition, however there is always the potential for conditions to change. Please read and follow the below instructions.  Do not take your medicine if  develop an itchy rash, swelling in your mouth or lips, or difficulty breathing; call 911 and seek immediate emergency medical attention if this occurs.  You may review your lab tests and imaging results in their entirety on your MyChart account.  Please discuss all results of fully with your primary care provider and other specialist at your follow-up visit.  Note: Portions of this text may have been transcribed using voice recognition software. Every effort was made to ensure accuracy; however, inadvertent computerized transcription errors may still be present.

## 2023-02-14 NOTE — ED Triage Notes (Signed)
 C/o R sided flank pain and lower abd pain since last night. Endorses increased urination and n/v/d. HTN in triage but has not had bp meds today.

## 2023-02-14 NOTE — ED Notes (Signed)
 Pt reports she had x1 emesis occurrence while being transported to CT. Reports pain and nausea are returning. EDP notified.

## 2023-02-15 ENCOUNTER — Encounter (HOSPITAL_BASED_OUTPATIENT_CLINIC_OR_DEPARTMENT_OTHER): Payer: Self-pay | Admitting: Emergency Medicine

## 2023-02-15 ENCOUNTER — Other Ambulatory Visit: Payer: Self-pay

## 2023-02-15 ENCOUNTER — Emergency Department (HOSPITAL_BASED_OUTPATIENT_CLINIC_OR_DEPARTMENT_OTHER)
Admission: EM | Admit: 2023-02-15 | Discharge: 2023-02-15 | Disposition: A | Discharge status: Home / Self Care | Payer: BC Managed Care – PPO | Attending: Emergency Medicine | Admitting: Emergency Medicine

## 2023-02-15 DIAGNOSIS — I10 Essential (primary) hypertension: Secondary | ICD-10-CM | POA: Insufficient documentation

## 2023-02-15 DIAGNOSIS — H1133 Conjunctival hemorrhage, bilateral: Secondary | ICD-10-CM | POA: Diagnosis not present

## 2023-02-15 DIAGNOSIS — H5789 Other specified disorders of eye and adnexa: Secondary | ICD-10-CM | POA: Diagnosis not present

## 2023-02-15 NOTE — Discharge Instructions (Addendum)
 This is a benign complication of vomiting, and will go away on its own over time. Please use Tylenol  or ibuprofen  for pain.  You may use 600 mg ibuprofen  every 6 hours or 1000 mg of Tylenol  every 6 hours.  You may choose to alternate between the 2.  This would be most effective.  Not to exceed 4 g of Tylenol  within 24 hours.  Not to exceed 3200 mg ibuprofen  24 hours.

## 2023-02-15 NOTE — ED Triage Notes (Signed)
 Broken blood vessel in eye.   Was seen yesterday for abdo pain, reports some forceful vomiting just prior to blood in eye being notice.  Denies and visual changes  Also reports some neck pain Full movement

## 2023-02-15 NOTE — ED Provider Notes (Signed)
 [place] EMERGENCY DEPARTMENT AT [facility] Provider Note   CSN: [id number] Arrival date & time: 02/15/23  1308     History  Chief Complaint  Patient presents with   Eye Problem    Shelby Chandler is a 33 y.o. female with past medical history seen for hypertension, obesity, anxiety, depression who presents with concern for broken blood vessel in bilateral eyes.  Patient reports she had some forceful vomiting yesterday after giving IV contrast.  She just went to make sure that it was not something concerning.  She denies any vision changes.  She does report some headache behind her eyes.   Eye Problem      Home Medications Prior to Admission medications   Medication Sig Start Date End Date Taking? Authorizing Provider  ciprofloxacin  (CIPRO ) 500 MG tablet Take 1 tablet (500 mg total) by mouth every 12 (twelve) hours for 7 days. 02/14/23 02/21/23  Schutt, Marsa HERO, PA-C  fluticasone  (FLONASE ) 50 MCG/ACT nasal spray SPRAY 2 SPRAYS INTO EACH NOSTRIL EVERY DAY 01/18/23   [name], MD  levocetirizine (XYZAL ) 5 MG tablet TAKE 1 TABLET BY MOUTH EVERY DAY IN THE EVENING 06/28/21   [name], MD  naproxen  (NAPROSYN ) 500 MG tablet Take 1 tablet (500 mg total) by mouth 2 (two) times daily. 02/14/23   Schutt, Marsa HERO, PA-C  ondansetron  (ZOFRAN -ODT) 4 MG disintegrating tablet Take 1 tablet (4 mg total) by mouth every 8 (eight) hours as needed for nausea or vomiting. 02/14/23   Schutt, Marsa HERO, PA-C  propranolol  ER (INDERAL  LA) 120 MG 24 hr capsule Take 1 capsule (120 mg total) by mouth daily. 01/25/23   [name], MD  Semaglutide -Weight Management 0.25 MG/0.5ML SOAJ Inject 0.25 mg into the skin once a week for 28 days. 02/13/23 03/13/23  [name], MD  Semaglutide -Weight Management 0.5 MG/0.5ML SOAJ Inject 0.5 mg into the skin once a week for 28 days. 03/14/23 04/11/23  [name], MD  Semaglutide -Weight Management 1 MG/0.5ML SOAJ Inject 1 mg into  the skin once a week. 04/12/23   [name], MD  Vitamin D , Ergocalciferol , (DRISDOL ) 1.25 MG (50000 UNIT) CAPS capsule Take 1 capsule (50,000 Units total) by mouth every 7 (seven) days. 01/25/23   [name], MD      Allergies    Patient has no known allergies.    Review of Systems   Review of Systems  All other systems reviewed and are negative.   Physical Exam Updated Vital Signs BP (!) 157/98 (BP Location: Right Wrist)   Pulse 67   Temp 98.2 F (36.8 C)   Resp 16   LMP 01/31/2023 (Exact Date)   SpO2 98%  Physical Exam Vitals and nursing note reviewed.  Constitutional:      General: She is not in acute distress.    Appearance: Normal appearance.  HENT:     Head: Normocephalic and atraumatic.  Eyes:     General:        Right eye: No discharge.        Left eye: No discharge.     Comments: Subconjunctival hemorrhage bilaterally, left greater than right.  On either side the blood does not cross into the iris.  She has normal pupillary response.  Pupils are equal round reactive to light.  Cardiovascular:     Rate and Rhythm: Normal rate and regular rhythm.  Pulmonary:     Effort: Pulmonary effort is normal. No respiratory distress.  Musculoskeletal:        General: No deformity.  Skin:    General: Skin is warm and dry.  Neurological:     Mental Status: She is alert and oriented to person, place, and time.  Psychiatric:        Mood and Affect: Mood normal.        Behavior: Behavior normal.     ED Results / Procedures / Treatments   Labs (all labs ordered are listed, but only abnormal results are displayed) Labs Reviewed - No data to display  EKG None  Radiology CT ABDOMEN PELVIS W CONTRAST Result Date: 02/14/2023 CLINICAL DATA:  Right lower quadrant abdominal pain. EXAM: CT ABDOMEN AND PELVIS WITH CONTRAST TECHNIQUE: Multidetector CT imaging of the abdomen and pelvis was performed using the standard protocol following bolus administration of  intravenous contrast. RADIATION DOSE REDUCTION: This exam was performed according to the departmental dose-optimization program which includes automated exposure control, adjustment of the mA and/or kV according to patient size and/or use of iterative reconstruction technique. CONTRAST:  OMNIPAQUE  IOHEXOL  350 MG/ML SOLN COMPARISON:  None Available. FINDINGS: Evaluation of this exam is limited in the absence of intravenous contrast. Lower chest: The visualized lung bases are clear. No intra-abdominal free air or free fluid. Hepatobiliary: Fatty liver. No biliary dilatation. The gallbladder is unremarkable. Pancreas: Unremarkable. No pancreatic ductal dilatation or surrounding inflammatory changes. Spleen: Normal in size without focal abnormality. Adrenals/Urinary Tract: The adrenal glands unremarkable. There is no hydronephrosis on either side. There is symmetric enhancement and excretion of contrast by both kidneys. The visualized ureters and urinary bladder appear unremarkable. Stomach/Bowel: Several small scattered colonic diverticula. There is no bowel obstruction or active inflammation. The appendix is normal. Vascular/Lymphatic: The abdominal aorta and IVC are unremarkable. No portal venous gas. There is no adenopathy. Reproductive: The uterus is anteverted and grossly unremarkable. No suspicious adnexal masses. Other: None Musculoskeletal: No acute or significant osseous findings. IMPRESSION: 1. No acute intra-abdominal or pelvic pathology. 2. Fatty liver. 3. Mild colonic diverticulosis. No bowel obstruction. Normal appendix. Electronically Signed   By: Vanetta Chou M.D.   On: 02/14/2023 13:55    Procedures Procedures    Medications Ordered in ED Medications - No data to display  ED Course/ Medical Decision Making/ A&P                                 Medical Decision Making  This patient is a 33 y.o. female who presents to the ED for concern of blood vessel broken and eyes.    Differential diagnoses prior to evaluation: Subconjunctival hemorrhage versus hyphema, versus open globe injury, versus other significant or emergent ophthalmic complication.  Past Medical History / Social History / Additional history: Chart reviewed. Pertinent results include: Overall noncontributory  Physical Exam: Physical exam performed. The pertinent findings include: Subconjunctival hemorrhage bilaterally, left greater than right.  On either side the blood does not cross into the iris.  She has normal pupillary response.  Pupils are equal round reactive to light.   Medications / Treatment: Discussed no treatment needed, the problem is self-limited, she has bilateral subconjunctival hemorrhage without hyphema   Disposition: After consideration of the diagnostic results and the patients response to treatment, I feel that patient is stable for discharge, plan as above for watchful waiting.   emergency department workup does not suggest an emergent condition requiring admission or immediate intervention beyond what has been  performed at this time. The plan is: as above. The patient is safe for discharge and has been instructed to return immediately for worsening symptoms, change in symptoms or any other concerns.  Final Clinical Impression(s) / ED Diagnoses Final diagnoses:  Subconjunctival hemorrhage of both eyes    Rx / DC Orders ED Discharge Orders     None         Rosan Sherlean VEAR DEVONNA 02/15/23 1505    Jerral Meth, MD 02/15/23 [phone]

## 2023-02-16 LAB — URINE CULTURE: Culture: >=100000 — AB

## 2023-02-17 ENCOUNTER — Ambulatory Visit: Payer: BC Managed Care – PPO

## 2023-03-08 ENCOUNTER — Ambulatory Visit: Payer: BC Managed Care – PPO | Admitting: Family Medicine

## 2023-03-08 ENCOUNTER — Encounter: Payer: Self-pay | Admitting: Family Medicine

## 2023-03-08 VITALS — BP 142/90 | HR 76 | Temp 98.4°F | Ht 65.0 in | Wt >= 6400 oz

## 2023-03-08 DIAGNOSIS — N926 Irregular menstruation, unspecified: Secondary | ICD-10-CM

## 2023-03-08 DIAGNOSIS — Z6841 Body Mass Index (BMI) 40.0 and over, adult: Secondary | ICD-10-CM | POA: Diagnosis not present

## 2023-03-08 DIAGNOSIS — I10 Essential (primary) hypertension: Secondary | ICD-10-CM | POA: Diagnosis not present

## 2023-03-08 DIAGNOSIS — Z3A01 Less than 8 weeks gestation of pregnancy: Secondary | ICD-10-CM

## 2023-03-08 MED ORDER — LABETALOL HCL 100 MG PO TABS: 100.0000 mg | ORAL_TABLET | Freq: Two times a day (BID) | ORAL | 0 refills | Status: DC

## 2023-03-08 NOTE — Progress Notes (Signed)
Established Patient Office Visit   Subjective  Patient ID: Shelby Chandler, female    DOB: 01-Jul-1990  Age: 33 y.o. MRN: [id number]  Chief Complaint  Patient presents with   Medical Management of Chronic Issues    6 mth follow up, BP recheck(patient is not taking at home) bp has been high, pos pregnancy test taken on 1/23 last menses dec24   Mesenteric Insufficiency  Pt accompanied by her sig other.  Pt is a 33 yo female G1P0101 with pmh sig for morbid obesity, chronic HTN, h/o SVT, anxiety, depression who was seen for acute concern, absent menses.  Menses expected on 02/28/2023.  Home urine hCG positive.  Pt was taking semaglutide for wt loss, but has stopped.  Taking propranolol LA 120 mg for BP consistently.  Not checking BP at home.  Elevated at appointments.    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   Anxiety and depression 08/28/2017   Chronic low back pain without sciatica 08/28/2017   History of anemia 08/28/2017   Essential hypertension 01/09/2017   Class 3 severe obesity due to excess calories without serious comorbidity with body mass index (BMI) of 60.0 to 69.9 in adult (HCC) 01/09/2017   Past Medical History:  Diagnosis Date   Anemia    Anxiety    Hypertension    Past Surgical History:  Procedure Laterality Date   CESAREAN SECTION  11/21/2020   Procedure: CESAREAN SECTION;  Surgeon: Reva Bores, MD;  [facility]: [facility];  Service: Obstetrics;;   TOOTH EXTRACTION     Social History   Tobacco Use   Smoking status:  Never   Smokeless tobacco: Never  Vaping Use   Vaping status: Never Used  Substance Use Topics   Alcohol use: Not Currently   Drug use: No   Family History  Problem Relation Age of Onset   Alcohol abuse Mother    Arthritis Mother    Depression Mother    Drug abuse Mother    Heart attack Mother    Hypertension Mother    Hyperlipidemia Mother    Alcohol abuse Father    Diabetes Father    Drug abuse Father    Hypertension Father    Heart attack Father    Stroke Father    Hypertension Sister    Arthritis Maternal Grandmother    Arthritis Maternal Grandfather    Diabetes Paternal Grandmother    No Known Allergies    ROS Negative unless stated above    Objective:     BP (!) 142/90 (BP Location: Right Wrist, Patient Position: Sitting, Cuff Size: Normal)   Pulse 76   Temp 98.4 F (36.9 C) (Oral)   Ht 5\' 5"  (1.651 m)   Wt (!) 431 lb (195.5 kg)   LMP 01/31/2023 (Exact Date)   SpO2 99%   BMI 71.72 kg/m  BP Readings from Last 3 Encounters:  03/08/23 (!) 142/90  02/15/23 (!) 157/98  02/14/23 (!) 180/115   Wt Readings from Last 3 Encounters:  03/08/23 (!) 431 lb (195.5 kg)  02/14/23 (!) 429 lb 14.4  oz (195 kg)  02/09/23 (!) 430 lb 12.8 oz (195.4 kg)      Physical Exam Constitutional:      General: She is not in acute distress.    Appearance: Normal appearance. She is obese.  HENT:     Head: Normocephalic and atraumatic.     Nose: Nose normal.     Mouth/Throat:     Mouth: Mucous membranes are moist.  Cardiovascular:     Rate and Rhythm: Normal rate and regular rhythm.     Heart sounds: Normal heart sounds. No murmur heard.    No gallop.  Pulmonary:     Effort: Pulmonary effort is normal. No respiratory distress.     Breath sounds: Normal breath sounds. No wheezing, rhonchi or rales.  Skin:    General: Skin is warm and dry.  Neurological:     Mental Status: She is alert and oriented to person, place, and time.    No results found for any visits on  03/08/23.    Assessment & Plan:  Less than [redacted] weeks gestation of pregnancy  Missed menses -     POCT urine pregnancy -     hCG, quantitative, pregnancy  Morbid obesity (HCC) -Body mass index is 71.72 kg/m.  Essential hypertension -     Labetalol HCl; Take 1 tablet (100 mg total) by mouth 2 (two) times daily.  Dispense: 180 tablet; Refill: 0  Patient, now a G2P0101 seen for positive home pregnancy test.  LMP 01/31/23.  Urine hCG in clinic positive.  Will obtain quantitative hCG blood test.  Start PNV.   D/c semaglutide.  Discussed healthy lifestyle including exercise and diet.  BP improved this visit, but remains elevated at 142/90, previously 181/137.  Recheck.  Will switch to labetalol 100 mg BID and d/c propranalol ER 120 mg daily.  Will likely need dose adjustment.  Advised to monitor bp at home.  Advised to schedule OB intake appt with OB/Gyn.  Return in about 1 week (around 03/15/2023) for blood pressure.   Deeann Saint, MD

## 2023-03-08 NOTE — Patient Instructions (Signed)
A new prescription for labetalol 100 mg twice a day was sent to your pharmacy.  This is a new blood pressure medication to take the place of the propranolol you are currently taking.  It is the same class of medication as your other blood pressure medication.  The dose may need to be adjusted based on how well your blood pressure is being controlled.  We will have you start taking this new medication and follow-up next week to check on your blood pressure.

## 2023-03-09 LAB — HCG, QUANTITATIVE, PREGNANCY: Quantitative HCG: 8669 m[IU]/mL

## 2023-03-12 ENCOUNTER — Encounter: Payer: Self-pay | Admitting: Family Medicine

## 2023-03-13 LAB — POCT URINE PREGNANCY: Preg Test, Ur: POSITIVE — AB

## 2023-03-15 ENCOUNTER — Ambulatory Visit: Payer: BC Managed Care – PPO | Admitting: Family Medicine

## 2023-04-07 ENCOUNTER — Other Ambulatory Visit (INDEPENDENT_AMBULATORY_CARE_PROVIDER_SITE_OTHER): Payer: Self-pay

## 2023-04-07 ENCOUNTER — Other Ambulatory Visit (HOSPITAL_COMMUNITY)
Admission: RE | Admit: 2023-04-07 | Discharge: 2023-04-07 | Disposition: A | Discharge status: Home / Self Care | Source: Ambulatory Visit | Attending: Obstetrics and Gynecology | Admitting: Obstetrics and Gynecology

## 2023-04-07 ENCOUNTER — Ambulatory Visit: Payer: BC Managed Care – PPO | Admitting: *Deleted

## 2023-04-07 ENCOUNTER — Other Ambulatory Visit: Payer: Self-pay | Admitting: Obstetrics and Gynecology

## 2023-04-07 VITALS — BP 174/102 | HR 59 | Wt >= 6400 oz

## 2023-04-07 DIAGNOSIS — Z3A09 9 weeks gestation of pregnancy: Secondary | ICD-10-CM

## 2023-04-07 DIAGNOSIS — O3680X0 Pregnancy with inconclusive fetal viability, not applicable or unspecified: Secondary | ICD-10-CM

## 2023-04-07 DIAGNOSIS — Z1339 Encounter for screening examination for other mental health and behavioral disorders: Secondary | ICD-10-CM

## 2023-04-07 DIAGNOSIS — O0990 Supervision of high risk pregnancy, unspecified, unspecified trimester: Secondary | ICD-10-CM | POA: Insufficient documentation

## 2023-04-07 DIAGNOSIS — Z3481 Encounter for supervision of other normal pregnancy, first trimester: Secondary | ICD-10-CM

## 2023-04-07 DIAGNOSIS — O0991 Supervision of high risk pregnancy, unspecified, first trimester: Secondary | ICD-10-CM

## 2023-04-07 MED ORDER — LABETALOL HCL 200 MG PO TABS
200.0000 mg | ORAL_TABLET | Freq: Two times a day (BID) | ORAL | 3 refills | Status: DC
Start: 2023-04-07 — End: 2023-04-12

## 2023-04-07 NOTE — Patient Instructions (Signed)

## 2023-04-07 NOTE — Progress Notes (Signed)
 New OB Intake  I connected with Shelby Chandler  on 04/07/23 at  8:15 AM EST by  In Person Visit and verified that I am speaking with the correct person using two identifiers. Nurse is located at [facility] and pt is located at [facility].  I discussed the limitations, risks, security and privacy concerns of performing an evaluation and management service by telephone and the availability of in person appointments. I also discussed with the patient that there may be a patient responsible charge related to this service. The patient expressed understanding and agreed to proceed.  I explained I am completing New OB Intake today. We discussed EDD of 11/07/2023, by Last Menstrual Period. Pt is G2P0101. I reviewed her allergies, medications and Medical/Surgical/OB history.    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 Discussed the nature of our practice with multiple providers including residents and students. Due to the size of the practice, the delivering provider may not be the same as those providing prenatal care.   Patient is not a candidate for water birth. Offered upcoming OB visit with CNM  to discuss further.  MyChart/Babyscripts MyChart access verified. I explained pt will have some visits in office and some virtually. Babyscripts instructions given and order placed. Patient verifies receipt of registration text/e-mail. Account successfully created and app downloaded. If patient is a candidate for Optimized scheduling, add to sticky note.   Blood Pressure Cuff/Weight Scale Pt has BP cuff at home. Explained after first prenatal appt pt will check weekly and document in Babyscripts. Patient does not have weight scale; patient may purchase if they desire to track weight weekly in Babyscripts.  Anatomy US Explained first scheduled [place] will be around 19 weeks. Anatomy US scheduled for TBD at TBD.  Interested in [place]? If yes, send referral and doula dot phrase.   Is patient a candidate for Babyscripts Optimization? No, due to Risk Factors   First visit review I reviewed new OB appt with patient. Explained pt will be seen by Dr. Marice Potter at first visit. Discussed [name] genetic screening with patient. Requests Panorama and Horizon.. Routine prenatal labs  OB Urine and GC/CC only collected at today's visit.    Last Pap No results found for: "DIAGPAP"  [name], RN 04/07/2023  8:31 AM

## 2023-04-07 NOTE — Progress Notes (Signed)
 Pt with severe range BP's. Dr. Jolayne Panther was consulted. Labetalol increased to 200 mg BID. Pt will return for BP check next week on 04/12/23.

## 2023-04-09 LAB — CERVICOVAGINAL ANCILLARY ONLY
Chlamydia: NEGATIVE
Comment: NEGATIVE
Comment: NORMAL
Neisseria Gonorrhea: NEGATIVE

## 2023-04-09 LAB — URINE CULTURE, OB REFLEX

## 2023-04-09 LAB — CULTURE, OB URINE

## 2023-04-12 ENCOUNTER — Ambulatory Visit: Payer: BC Managed Care – PPO

## 2023-04-12 DIAGNOSIS — Z0131 Encounter for examination of blood pressure with abnormal findings: Secondary | ICD-10-CM

## 2023-04-12 MED ORDER — LABETALOL HCL 200 MG PO TABS: 300.0000 mg | ORAL_TABLET | Freq: Two times a day (BID) | ORAL | 3 refills | Status: DC

## 2023-04-12 NOTE — Progress Notes (Signed)
 Subjective:  Shelby Chandler is a 33 y.o. female here for BP check.   Hypertension ROS: taking medications as instructed, no medication side effects noted, no TIA's, no chest pain on exertion, no dyspnea on exertion, and no swelling of ankles.    Objective:  LMP 01/31/2023 (Exact Date)   Appearance alert, well appearing, and in no distress. General exam BP noted to be well controlled today in office.    Assessment:   Blood Pressure improved. Reports all previous readings have been around 160s/100s.   Plan:  Orders and follow up as documented in patient record. Per Dr. Donavan Foil increase pt labetalol to 300mg  BID. Follow up BP check in a week or so. Pt has a prenatal visit scheduled on 3/18, is not able to do follow up BP check next week due to work. Will recheck at prenatal on 3/18. Pt to report to [place] if she starts to experience any symptoms.

## 2023-04-25 ENCOUNTER — Ambulatory Visit: Payer: BC Managed Care – PPO | Admitting: Obstetrics

## 2023-04-25 ENCOUNTER — Other Ambulatory Visit (HOSPITAL_COMMUNITY)
Admission: RE | Admit: 2023-04-25 | Discharge: 2023-04-25 | Disposition: A | Discharge status: Home / Self Care | Source: Ambulatory Visit | Attending: Obstetrics | Admitting: Obstetrics

## 2023-04-25 VITALS — BP 163/99 | HR 68 | Wt >= 6400 oz

## 2023-04-25 DIAGNOSIS — Z113 Encounter for screening for infections with a predominantly sexual mode of transmission: Secondary | ICD-10-CM | POA: Diagnosis not present

## 2023-04-25 DIAGNOSIS — Z98891 History of uterine scar from previous surgery: Secondary | ICD-10-CM | POA: Diagnosis not present

## 2023-04-25 DIAGNOSIS — O0990 Supervision of high risk pregnancy, unspecified, unspecified trimester: Secondary | ICD-10-CM

## 2023-04-25 DIAGNOSIS — Z124 Encounter for screening for malignant neoplasm of cervix: Secondary | ICD-10-CM | POA: Diagnosis not present

## 2023-04-25 DIAGNOSIS — O10011 Pre-existing essential hypertension complicating pregnancy, first trimester: Secondary | ICD-10-CM

## 2023-04-25 DIAGNOSIS — O0991 Supervision of high risk pregnancy, unspecified, first trimester: Secondary | ICD-10-CM | POA: Diagnosis not present

## 2023-04-25 DIAGNOSIS — O99211 Obesity complicating pregnancy, first trimester: Secondary | ICD-10-CM

## 2023-04-25 DIAGNOSIS — O10019 Pre-existing essential hypertension complicating pregnancy, unspecified trimester: Secondary | ICD-10-CM

## 2023-04-25 DIAGNOSIS — O99210 Obesity complicating pregnancy, unspecified trimester: Secondary | ICD-10-CM

## 2023-04-25 DIAGNOSIS — Z3A12 12 weeks gestation of pregnancy: Secondary | ICD-10-CM

## 2023-04-25 NOTE — Progress Notes (Unsigned)
 Subjective:    Shelby Chandler is being seen today for her first obstetrical visit.  This {is/is not:9024} a planned pregnancy. She is at [redacted]w[redacted]d gestation. Her obstetrical history is significant for {ob risk factors:10154}. Relationship with FOB: {fob:16621}. Patient {does/does not:19097} intend to breast feed. Pregnancy history fully reviewed.  The information documented in the HPI was reviewed and verified.  Menstrual History: OB History     Gravida  2   Para  1   Term  0   Preterm  1   AB  0   Living  1      SAB  0   IAB  0   Ectopic  0   Multiple  0   Live Births  1           Menarche age: *** Patient's last menstrual period was 01/31/2023 (exact date).    Past Medical History:  Diagnosis Date   Anemia    Anxiety    Hypertension     Past Surgical History:  Procedure Laterality Date   CESAREAN SECTION  11/21/2020   Procedure: CESAREAN SECTION;  Surgeon: Reva Bores, MD;  Location: [facility];  Service: Obstetrics;;   TOOTH EXTRACTION      (Not in a hospital admission)  No Known Allergies  Social History   Tobacco Use   Smoking status: Never   Smokeless tobacco: Never  Substance Use Topics   Alcohol use: Not Currently    Family History  Problem Relation Age of Onset   Alcohol abuse Mother    Arthritis Mother    Depression Mother    Drug abuse Mother    Heart attack Mother    Hypertension Mother    Hyperlipidemia Mother    Alcohol abuse Father    Diabetes Father    Drug abuse Father    Hypertension Father    Heart attack Father    Stroke Father    Hypertension Sister    Arthritis Maternal Grandmother    Arthritis Maternal Grandfather    Diabetes Paternal Grandmother      Review of Systems Constitutional: negative for weight loss Gastrointestinal: negative for vomiting Genitourinary:negative for genital lesions and vaginal discharge and dysuria Musculoskeletal:negative for back pain Behavioral/Psych: negative for  abusive relationship, depression, illegal drug usage and tobacco use    Objective:    BP (!) 165/92   Pulse 72   Wt (!) 444 lb (201.4 kg)   LMP 01/31/2023 (Exact Date)   BMI 73.89 kg/m  General Appearance:    Alert, cooperative, no distress, appears stated age  Head:    Normocephalic, without obvious abnormality, atraumatic  Eyes:    PERRL, conjunctiva/corneas clear, EOM's intact, fundi    benign, both eyes  Ears:    Normal TM's and external ear canals, both ears  Nose:   Nares normal, septum midline, mucosa normal, no drainage    or sinus tenderness  Throat:   Lips, mucosa, and tongue normal; teeth and gums normal  Neck:   Supple, symmetrical, trachea midline, no adenopathy;    thyroid:  no enlargement/tenderness/nodules; no carotid   bruit or JVD  Back:     Symmetric, no curvature, ROM normal, no CVA tenderness  Lungs:     Clear to auscultation bilaterally, respirations unlabored  Chest Wall:    No tenderness or deformity   Heart:    Regular rate and rhythm, S1 and S2 normal, no murmur, rub   or gallop  Breast Exam:  No tenderness, masses, or nipple abnormality  Abdomen:     Soft, non-tender, bowel sounds active all four quadrants,    no masses, no organomegaly  Genitalia:    Normal female without lesion, discharge or tenderness  Extremities:   Extremities normal, atraumatic, no cyanosis or edema  Pulses:   2+ and symmetric all extremities  Skin:   Skin color, texture, turgor normal, no rashes or lesions  Lymph nodes:   Cervical, supraclavicular, and axillary nodes normal  Neurologic:   CNII-XII intact, normal strength, sensation and reflexes    throughout      Lab Review Urine pregnancy test Labs reviewed {YES NO:22349} Radiologic studies reviewed {YES NO:22349}  Assessment:    Pregnancy at [redacted]w[redacted]d weeks    Plan:      Prenatal vitamins.  Counseling provided regarding continued use of seat belts, cessation of alcohol consumption, smoking or use of illicit drugs;  infection precautions i.e., influenza/TDAP immunizations, toxoplasmosis,CMV, parvovirus, listeria and varicella; workplace safety, exercise during pregnancy; routine dental care, safe medications, sexual activity, hot tubs, saunas, pools, travel, caffeine use, fish and methlymercury, potential toxins, hair treatments, varicose veins Weight gain recommendations per IOM guidelines reviewed: underweight/BMI< 18.5--> gain 28 - 40 lbs; normal weight/BMI 18.5 - 24.9--> gain 25 - 35 lbs; overweight/BMI 25 - 29.9--> gain 15 - 25 lbs; obese/BMI >30->gain  11 - 20 lbs Problem list reviewed and updated. FIRST/CF mutation testing/NIPT/QUAD SCREEN/fragile X/Ashkenazi Jewish population testing/Spinal muscular atrophy discussed: {requests/ordered/declines:14581}. Role of ultrasound in pregnancy discussed; fetal survey: {requests/ordered/declines:14581}. Amniocentesis discussed: {amniocentesis:14582}. VBAC calculator score: VBAC consent form provided No orders of the defined types were placed in this encounter.  Orders Placed This Encounter  Procedures   PANORAMA PRENATAL TEST    ==========Department Information========== ID: [id number] Department:[facility]ALTHCARE AT [facility] [address] [place] [address] Dept: [phone] Dept Fax: [phone]     Method/type of collection::   Clinic to manage sample collection    Expected due date (MM/DD/YYYY)::   11/07/2023    Is this a twin pregnancy? (viable, no vanished twin):   Not Twin Pregnancy, [place]    Is this a surrogate or egg donor pregnancy?:   No    I want fetal sex included in the report::   Yes    Maternal Weight (lbs)::   444    Which Microdeletion Panel should be ordered?:   22q11.2 Deletion    What type of billing?:   Automatic Data    By placing this electronic order I confirm the testing ordered herein is medically necessary and this patient has been  informed of the details of the genetic test(s) ordered, including the risks, benefits, and alternatives, and has consented to testing.:   Yes    Select an order diagnosis: For additional options refer to [email or web address].[email or web address]:   Encounter for supervision of normal first pregnancy in first trimester [669301]   HORIZON Basic Panel    ==========Department Information========== ID: [id number] Department:[facility] HEALTHCARE AT [facility] [address] [place] [address] Dept: [phone] Dept Fax: [phone]     Specify the name or ID of a valid Horizon Custom Panel::   HBASIC    Is patient pregnant?:   Yes    Practice ensures that HIPAA  consent is obtained and will make available to [facility] upon request?:   Yes    By placing this electronic order I confirm the testing ordered herein is medically necessary and this patient has been informed of the details of the genetic test(s) ordered, including the risks, benefits, and alternatives, and has consented to testing.:   Yes    What type of billing?:   [employer] an order diagnosis: For additional options refer to [email or web address]:   Encounter for supervision of other normal pregnancy, first trimester [4401027]    Tay-Sachs add-on test?:   No   CBC/D/Plt+RPR+Rh+ABO+RubIgG...   HgB A1c   Comp Met (CMET)    Follow up in {numbers 0-4:31231} weeks.  I have spent a total of 30 minutes of face-to-face time, excluding clinical staff time, reviewing notes and preparing to see patient, ordering tests and/or medications, and counseling the patient.   Brock Bad, MD, FACOG Attending Obstetrician & Gynecologist, [facility] for [facility], [facility], [place] 04/25/2023

## 2023-04-26 LAB — CERVICOVAGINAL ANCILLARY ONLY
Bacterial Vaginitis (gardnerella): NEGATIVE
Candida Glabrata: NEGATIVE
Candida Vaginitis: POSITIVE — AB
Chlamydia: NEGATIVE
Comment: NEGATIVE
Comment: NEGATIVE
Comment: NEGATIVE
Comment: NEGATIVE
Comment: NEGATIVE
Comment: NORMAL
Neisseria Gonorrhea: NEGATIVE
Trichomonas: NEGATIVE

## 2023-04-27 ENCOUNTER — Other Ambulatory Visit: Payer: Self-pay | Admitting: Obstetrics

## 2023-04-27 LAB — CYTOLOGY - PAP
Comment: NEGATIVE
Diagnosis: NEGATIVE
High risk HPV: NEGATIVE

## 2023-05-03 ENCOUNTER — Ambulatory Visit (INDEPENDENT_AMBULATORY_CARE_PROVIDER_SITE_OTHER): Admitting: Obstetrics & Gynecology

## 2023-05-03 VITALS — BP 159/93 | HR 71 | Wt >= 6400 oz

## 2023-05-03 DIAGNOSIS — O0990 Supervision of high risk pregnancy, unspecified, unspecified trimester: Secondary | ICD-10-CM | POA: Diagnosis not present

## 2023-05-03 DIAGNOSIS — I10 Essential (primary) hypertension: Secondary | ICD-10-CM | POA: Diagnosis not present

## 2023-05-03 DIAGNOSIS — O0991 Supervision of high risk pregnancy, unspecified, first trimester: Secondary | ICD-10-CM | POA: Diagnosis not present

## 2023-05-03 MED ORDER — LABETALOL HCL 200 MG PO TABS: 400.0000 mg | ORAL_TABLET | Freq: Two times a day (BID) | ORAL | 3 refills | Status: DC

## 2023-05-03 NOTE — Progress Notes (Signed)
   PRENATAL VISIT NOTE  Subjective:  Shelby Chandler is a 33 y.o. G2P0101 at [redacted]w[redacted]d being seen today for ongoing prenatal care.  She is currently monitored for the following issues for this high-risk pregnancy and has Essential hypertension; Class 3 severe obesity due to excess calories without serious comorbidity with body mass index (BMI) of 60.0 to 69.9 in adult [facility]); Anxiety and depression; Chronic low back pain without sciatica; History of anemia; HSV-2 infection; Vitamin B12 deficiency; Absolute anemia; Morbid obesity (HCC); PSVT (paroxysmal supraventricular tachycardia) (HCC); BMI 70 and over, adult (HCC); Delivery by classical cesarean section; Chronic hypertension; Anemia, iron deficiency, inadequate dietary intake; Anemia, iron deficiency; and Supervision of high risk pregnancy, antepartum on their problem list.  Patient reports no complaints.  Contractions: Not present. Vag. Bleeding: None.   . Denies leaking of fluid.   The following portions of the patient's history were reviewed and updated as appropriate: allergies, current medications, past family history, past medical history, past social history, past surgical history and problem list.   Objective:   Vitals:   05/03/23 1446  BP: (!) 159/93  Pulse: 71  Weight: (!) 442 lb (200.5 kg)    Fetal Status:           General:  Alert, oriented and cooperative. Patient is in no acute distress.  Skin: Skin is warm and dry. No rash noted.   Cardiovascular: Normal heart rate noted  Respiratory: Normal respiratory effort, no problems with respiration noted  Abdomen: Soft, gravid, appropriate for gestational age.  Pain/Pressure: Absent     Pelvic: Cervical exam deferred        Extremities: Normal range of motion.     Mental Status: Normal mood and affect. Normal behavior. Normal judgment and thought content.   Assessment and Plan:  Pregnancy: G2P0101 at [redacted]w[redacted]d 1. Supervision of high risk pregnancy, antepartum  (Primary) Increase labetalol for BP control - labetalol (NORMODYNE) 200 MG tablet; Take 2 tablets (400 mg total) by mouth 2 (two) times daily.  Dispense: 120 tablet; Refill: 3  2. Essential hypertension   3. Morbid obesity (HCC)    labor symptoms and general obstetric precautions including but not limited to vaginal bleeding, contractions, leaking of fluid and fetal movement were reviewed in detail with the patient. Please refer to After Visit Summary for other counseling recommendations.   Return in about 4 weeks (around 05/31/2023).  Future Appointments  Date Time Provider Department Center  06/02/2023  8:35 AM Lennart Pall, MD [facility] None  06/20/2023 10:15 AM [facility] NURSE [facility]  06/20/2023 10:30 AM [facility] US1 [facility]  06/20/2023 11:00 AM [facility] PROVIDER 1 [facility]    Scheryl Darter, MD

## 2023-05-05 LAB — CBC/D/PLT+RPR+RH+ABO+RUBIGG...
Antibody Screen: NEGATIVE
Basophils Absolute: 0 10*3/uL (ref 0.0–0.2)
Basos: 0 %
EOS (ABSOLUTE): 0.1 10*3/uL (ref 0.0–0.4)
Eos: 2 %
HCV Ab: NONREACTIVE
HIV Screen 4th Generation wRfx: NONREACTIVE
Hematocrit: 36.6 % (ref 34.0–46.6)
Hemoglobin: 11.7 g/dL (ref 11.1–15.9)
Hepatitis B Surface Ag: NEGATIVE
Immature Grans (Abs): 0 10*3/uL (ref 0.0–0.1)
Immature Granulocytes: 0 %
Lymphocytes Absolute: 2 10*3/uL (ref 0.7–3.1)
Lymphs: 33 %
MCH: 23.8 pg — ABNORMAL LOW (ref 26.6–33.0)
MCHC: 32 g/dL (ref 31.5–35.7)
MCV: 74 fL — ABNORMAL LOW (ref 79–97)
Monocytes Absolute: 0.6 10*3/uL (ref 0.1–0.9)
Monocytes: 10 %
Neutrophils Absolute: 3.4 10*3/uL (ref 1.4–7.0)
Neutrophils: 55 %
Platelets: 395 10*3/uL (ref 150–450)
RBC: 4.92 x10E6/uL (ref 3.77–5.28)
RDW: 16.9 % — ABNORMAL HIGH (ref 11.7–15.4)
RPR Ser Ql: NONREACTIVE
Rh Factor: POSITIVE
Rubella Antibodies, IGG: 1.43 {index} (ref >0.99)
WBC: 6.1 10*3/uL (ref 3.4–10.8)

## 2023-05-05 LAB — COMPREHENSIVE METABOLIC PANEL WITH GFR
ALT: 21 IU/L (ref 0–32)
AST: 13 IU/L (ref 0–40)
Albumin: 4 g/dL (ref 3.9–4.9)
Alkaline Phosphatase: 57 IU/L (ref 44–121)
BUN/Creatinine Ratio: 9 (ref 9–23)
BUN: 7 mg/dL (ref 6–20)
Bilirubin Total: <0.2 mg/dL (ref 0.0–1.2)
CO2: 21 mmol/L (ref 20–29)
Calcium: 9.6 mg/dL (ref 8.7–10.2)
Chloride: 100 mmol/L (ref 96–106)
Creatinine, Ser: 0.77 mg/dL (ref 0.57–1.00)
Globulin, Total: 3.2 g/dL (ref 1.5–4.5)
Glucose: 74 mg/dL (ref 70–99)
Potassium: 4.5 mmol/L (ref 3.5–5.2)
Sodium: 136 mmol/L (ref 134–144)
Total Protein: 7.2 g/dL (ref 6.0–8.5)
eGFR: 104 mL/min/{1.73_m2} (ref >59)

## 2023-05-05 LAB — HEMOGLOBIN A1C
Est. average glucose Bld gHb Est-mCnc: 111 mg/dL
Hgb A1c MFr Bld: 5.5 % (ref 4.8–5.6)

## 2023-05-05 LAB — HCV INTERPRETATION

## 2023-06-02 ENCOUNTER — Ambulatory Visit (INDEPENDENT_AMBULATORY_CARE_PROVIDER_SITE_OTHER): Admitting: Obstetrics and Gynecology

## 2023-06-02 ENCOUNTER — Inpatient Hospital Stay (HOSPITAL_COMMUNITY)

## 2023-06-02 ENCOUNTER — Other Ambulatory Visit: Payer: Self-pay | Admitting: Advanced Practice Midwife

## 2023-06-02 ENCOUNTER — Inpatient Hospital Stay (HOSPITAL_COMMUNITY)
Admission: AD | Admit: 2023-06-02 | Discharge: 2023-06-02 | Disposition: A | Discharge status: Home / Self Care | Attending: Obstetrics and Gynecology | Admitting: Obstetrics and Gynecology

## 2023-06-02 VITALS — BP 153/96 | HR 62 | Wt >= 6400 oz

## 2023-06-02 DIAGNOSIS — O10912 Unspecified pre-existing hypertension complicating pregnancy, second trimester: Secondary | ICD-10-CM | POA: Diagnosis not present

## 2023-06-02 DIAGNOSIS — I471 Supraventricular tachycardia, unspecified: Secondary | ICD-10-CM | POA: Diagnosis not present

## 2023-06-02 DIAGNOSIS — O021 Missed abortion: Secondary | ICD-10-CM | POA: Diagnosis not present

## 2023-06-02 DIAGNOSIS — D181 Lymphangioma, any site: Secondary | ICD-10-CM | POA: Insufficient documentation

## 2023-06-02 DIAGNOSIS — O0992 Supervision of high risk pregnancy, unspecified, second trimester: Secondary | ICD-10-CM | POA: Insufficient documentation

## 2023-06-02 DIAGNOSIS — O368320 Maternal care for abnormalities of the fetal heart rate or rhythm, second trimester, not applicable or unspecified: Secondary | ICD-10-CM

## 2023-06-02 DIAGNOSIS — Z6841 Body Mass Index (BMI) 40.0 and over, adult: Secondary | ICD-10-CM

## 2023-06-02 DIAGNOSIS — N898 Other specified noninflammatory disorders of vagina: Secondary | ICD-10-CM

## 2023-06-02 DIAGNOSIS — O0990 Supervision of high risk pregnancy, unspecified, unspecified trimester: Secondary | ICD-10-CM

## 2023-06-02 DIAGNOSIS — I10 Essential (primary) hypertension: Secondary | ICD-10-CM | POA: Diagnosis not present

## 2023-06-02 DIAGNOSIS — Z3A17 17 weeks gestation of pregnancy: Secondary | ICD-10-CM | POA: Diagnosis not present

## 2023-06-02 DIAGNOSIS — O368310 Maternal care for abnormalities of the fetal heart rate or rhythm, first trimester, not applicable or unspecified: Secondary | ICD-10-CM | POA: Diagnosis not present

## 2023-06-02 DIAGNOSIS — Z79899 Other long term (current) drug therapy: Secondary | ICD-10-CM | POA: Diagnosis not present

## 2023-06-02 DIAGNOSIS — O358XX0 Maternal care for other (suspected) fetal abnormality and damage, not applicable or unspecified: Secondary | ICD-10-CM | POA: Diagnosis not present

## 2023-06-02 LAB — CBC
HCT: 40.4 % (ref 36.0–46.0)
Hemoglobin: 12.1 g/dL (ref 12.0–15.0)
MCH: 23.6 pg — ABNORMAL LOW (ref 26.0–34.0)
MCHC: 30 g/dL (ref 30.0–36.0)
MCV: 78.9 fL — ABNORMAL LOW (ref 80.0–100.0)
Platelets: 373 10*3/uL (ref 150–400)
RBC: 5.12 MIL/uL — ABNORMAL HIGH (ref 3.87–5.11)
RDW: 14.1 % (ref 11.5–15.5)
WBC: 4.7 10*3/uL (ref 4.0–10.5)
nRBC: 0 % (ref 0.0–0.2)

## 2023-06-02 LAB — TYPE AND SCREEN
ABO/RH(D): O POS
Antibody Screen: NEGATIVE

## 2023-06-02 LAB — FIBRINOGEN: Fibrinogen: 590 mg/dL — ABNORMAL HIGH (ref 210–475)

## 2023-06-02 MED ORDER — LABETALOL HCL 200 MG PO TABS: 300.0000 mg | ORAL_TABLET | Freq: Three times a day (TID) | ORAL | 3 refills | Status: DC

## 2023-06-02 NOTE — Progress Notes (Signed)
   PRENATAL VISIT NOTE  Subjective:  Shelby Chandler is a 33 y.o. G2P0101 at [redacted]w[redacted]d being seen today for ongoing prenatal care.  She is currently monitored for the following issues for this high-risk pregnancy and has Essential hypertension; Class 3 severe obesity due to excess calories without serious comorbidity with body mass index (BMI) of 60.0 to 69.9 in adult [facility]); Anxiety and depression; Chronic low back pain without sciatica; History of anemia; HSV-2 infection; Vitamin B12 deficiency; Absolute anemia; Morbid obesity (HCC); PSVT (paroxysmal supraventricular tachycardia) (HCC); BMI 70 and over, adult (HCC); Delivery by classical cesarean section; Chronic hypertension; Anemia, iron  deficiency, inadequate dietary intake; Anemia, iron  deficiency; Supervision of high risk pregnancy, antepartum; Cystic hygroma; and Missed abortion on their problem list.  Patient reports  see RN note .  Contractions: Not present. Vag. Bleeding: None.  Movement: Present. Denies leaking of fluid.   The following portions of the patient's history were reviewed and updated as appropriate: allergies, current medications, past family history, past medical history, past social history, past surgical history and problem list.   Objective:   Vitals:   06/02/23 0853 06/02/23 0900  BP: (!) 158/96 (!) 153/96  Pulse: 64 62  Weight: (!) 447 lb (202.8 kg)     Fetal Status:     Movement: Present     General:  Alert, oriented and cooperative. Patient is in no acute distress.  Skin: Skin is warm and dry. No rash noted.   Cardiovascular: Normal heart rate noted  Respiratory: Normal respiratory effort, no problems with respiration noted  Abdomen: Soft, gravid, appropriate for gestational age.  Pain/Pressure: Present      Assessment and Plan:  Pregnancy: G2P0101 at [redacted]w[redacted]d 1. Supervision of high risk pregnancy, antepartum (Primary) 2. [redacted] weeks gestation of pregnancy Unable to confirm FHT on transabdominal US  today.  Staff is not available for transvaginal ultrasound.  Discussed option to return to office Monday for transvaginal US  versus MAU evaluation - she prefers MAU evaluation. Staff notified.   3. PSVT (paroxysmal supraventricular tachycardia) (HCC) 4. Chronic hypertension Mild range BP in office today Increase to labetalol  300mg  TID  -- supposed to be 400 BID but doing 300 qAM and 400 at bedtime. Feels a head rush/woozy with 400mg  dose during the day Discussed option for nifedipine  if BP not well controlled with dose change - Protein / creatinine ratio, urine  5. Vaginal discharge - Cervicovaginal ancillary only( [place])  6. Delivery by classical cesarean section For repeat by 37 weeks  7. BMI 70 and over, adult [facility])  Please refer to After Visit Summary for other counseling recommendations.   Future Appointments  Date Time Provider Department Center  06/20/2023 10:15 AM [facility] NURSE [facility]  06/20/2023 10:30 AM [facility] US1 [facility]  06/20/2023 11:00 AM [facility] PROVIDER 1 [facility]    Izell Marsh, MD

## 2023-06-02 NOTE — MAU Provider Note (Signed)
 Chief Complaint: No chief complaint on file.   Event Date/Time   First Provider Initiated Contact with Patient 06/02/23 1046     SUBJECTIVE HPI: Shelby Chandler is a 33 y.o. G2P0101 at [redacted]w[redacted]d by 9 week US  who presents to Maternity Admissions from [facility] for [facility] for provider being unable to obtain fetal heart tones. Exam limited by body habitus. BMI 74.   Associated signs and symptoms: Negative for abdominal pain, vaginal bleeding, leaking of fluid.  Positive for vaginal discharge. Pt has known CHTN.  Blood pressures were 158/96 and 153/96 in the office this morning.  Labetalol  increased to 300 TID in the office today.   Past Medical History:  Diagnosis Date   Anemia    Anxiety    Hypertension    OB History  Gravida Para Term Preterm AB Living  2 1 0 1 0 1  SAB IAB Ectopic Multiple Live Births  0 0 0 0 1    # Outcome Date GA Lbr Len/2nd Weight Sex Type Anes PTL Lv  2 Current           1 Preterm 11/21/20 [redacted]w[redacted]d  920 g F CS-LTranv Spinal, EPI  LIV   Past Surgical History:  Procedure Laterality Date   CESAREAN SECTION  11/21/2020   Procedure: CESAREAN SECTION;  Surgeon: Granville Layer, MD;  Location: [facility] LD [facility]S;  Service: Obstetrics;;   TOOTH EXTRACTION     Social History   Socioeconomic History   Marital status: Single    Spouse name: Not on file   Number of children: Not on file   Years of education: Not on file   Highest education level: Bachelor's degree (e.g., BA, AB, BS)  Occupational History   Not on file  Tobacco Use   Smoking status: Never   Smokeless tobacco: Never  Vaping Use   Vaping status: Never Used  Substance and Sexual Activity   Alcohol use: Not Currently   Drug use: No   Sexual activity: Yes    Birth control/protection: None  Other Topics Concern   Not on file  Social History Narrative   Not on file   Social Drivers of Health   Financial Resource Strain: Low Risk  (01/24/2023)   Overall Financial Resource Strain (CARDIA)    Difficulty  of Paying Living Expenses: Not hard at all  Food Insecurity: No Food Insecurity (01/24/2023)   Hunger Vital Sign    Worried About Running Out of Food in the Last Year: Never true    Ran Out of Food in the Last Year: Never true  Transportation Needs: No Transportation Needs (01/24/2023)   PRAPARE - Administrator, Civil Service (Medical): No    Lack of Transportation (Non-Medical): No  Physical Activity: Unknown (01/24/2023)   Exercise Vital Sign    Days of Exercise per Week: 0 days    Minutes of Exercise per Session: Not on file  Stress: No Stress Concern Present (01/24/2023)   [employer] of Occupational Health - Occupational Stress Questionnaire    Feeling of Stress : Not at all  Social Connections: Socially Integrated (01/24/2023)   Social Connection and Isolation Panel [NHANES]    Frequency of Communication with Friends and Family: More than three times a week    Frequency of Social Gatherings with Friends and Family: Once a week    Attends Religious Services: 1 to 4 times per year    Active Member of [employer] or Organizations: Yes    Attends [employer]  or Organization Meetings: 1 to 4 times per year    Marital Status: Living with partner  Intimate Partner Violence: Not on file   Family History  Problem Relation Age of Onset   Alcohol abuse Mother    Arthritis Mother    Depression Mother    Drug abuse Mother    Heart attack Mother    Hypertension Mother    Hyperlipidemia Mother    Alcohol abuse Father    Diabetes Father    Drug abuse Father    Hypertension Father    Heart attack Father    Stroke Father    Hypertension Sister    Arthritis Maternal Grandmother    Arthritis Maternal Grandfather    Diabetes Paternal Grandmother    No current facility-administered medications on file prior to encounter.   Current Outpatient Medications on File Prior to Encounter  Medication Sig Dispense Refill   labetalol  (NORMODYNE ) 200 MG tablet Take 2 tablets (400 mg total)  by mouth 2 (two) times daily. 120 tablet 3   Prenatal Vit-Fe Fumarate-FA (PRENATAL VITAMINS PO) Take 2 tablets by mouth daily. 2 gummies     Vitamin D , Ergocalciferol , (DRISDOL ) 1.25 MG (50000 UNIT) CAPS capsule Take 1 capsule (50,000 Units total) by mouth every 7 (seven) days. 12 capsule 0   No Known Allergies  I have reviewed patient's Past Medical Hx, Surgical Hx, Family Hx, Social Hx, medications and allergies.   Review of Systems  All other systems reviewed and are negative.   OBJECTIVE Patient Vitals for the past 24 hrs:  BP Temp Temp src Pulse Resp SpO2 Height Weight  06/02/23 1144 (!) 201/112 -- -- (!) 54 20 100 % -- --  06/02/23 1035 (!) 180/108 (!) 97.3 F (36.3 C) Oral 60 18 100 % 5\' 5"  (1.651 m) (!) 203.2 kg   Constitutional: Well-developed, well-nourished female in no acute distress. Anxious.  Cardiovascular: normal rate Respiratory: normal rate and effort.  GI: Deferred Neurologic: Alert and oriented x 4.  GU: Deferred  LAB RESULTS No results found for this or any previous visit (from the past 24 hours).  IMAGING US  OB LESS THAN 14 WEEKS WITH OB TRANSVAGINAL Result Date: 06/02/2023 CLINICAL DATA:  No fetal heart tones detected in the physician's office. Seventeen weeks and 3 days pregnant by last menstrual period. EXAM: OBSTETRIC <14 WK US  AND TRANSVAGINAL OB US  TECHNIQUE: Both transabdominal and transvaginal ultrasound examinations were performed for complete evaluation of the gestation as well as the maternal uterus, adnexal regions, and pelvic cul-de-sac. Transvaginal technique was performed to assess early pregnancy. COMPARISON:  None Available. FINDINGS: Intrauterine gestational sac: Visualized Yolk sac:  Not visualized Embryo:  Visualized Cardiac Activity: Not visualized Other: Large, bilobed nuchal lucency measuring 1.6 cm in length. CRL:  59.6 mm   12 w   4 d Subchorionic hemorrhage:  None visualized. Maternal uterus/adnexae: The ovaries were not visualized. No  free peritoneal fluid. IMPRESSION: 1. Fetal demise at 12 weeks and 4 days' gestation. 2. Bilobed cystic hygroma. Critical Value/emergent results were called by telephone at the time of interpretation on 06/02/2023 at 12:07 pm to provider Dr. Racheal Buddle, who verbally acknowledged these results. Electronically Signed   By: Catherin Closs M.D.   On: 06/02/2023 12:09    MAU COURSE Orders Placed This Encounter  Procedures   US  MFM OB LIMITED   No orders of the defined types were placed in this encounter.   MDM - Pt with fetal demise measuring 12.4 weeks. Cystic hygromas noted.  Consulted with Dr. Racheal Buddle due to late first trimester demise.  He consulted with Dr. Aquilla Knapp and both suggested D&C rather than Cytotec  for management.  Discussed R/B/I of both options and recommendation for D&C.  Patient verbalizes understanding and gives consent.  Ambulatory referral to GYN surgery placed.  Patient requests [facility] for chromosomal testing in the setting of cystic hygromas noted on ultrasound and MAB.  Interested in genetic counseling after D&E and abnormal results come back.  Offered IBH.  Declined.  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  -Patient with known history of chronic hypertension. Blood pressures were 158/96 and 153/96 in the office this morning.  Labetalol  increased to 300 TID in the office today.  Blood pressure severely elevated in MAU today but likely due to being distraught due to miscarriage.  No headache or evidence of hypertensive emergency.  Decision made not to make further changes to blood pressure meds since a plan is in place and blood pressure very likely falsely elevated due to distress.  ASSESSMENT 1. Missed abortion   2. Cystic hygroma   3. Supervision of high risk pregnancy, antepartum   4. Chronic hypertension   5. Essential hypertension     PLAN Discharge home in stable condition consult with Dr. Racheal Buddle. SAB precautions Hypertension precautions Instructed to increase labetalol  dose as  instructed by Dr. Donetta Furl as soon as she gets home.  Follow-up Information     Surgery Scheduler Follow up.   Why: Will call you to schedule dilation and evacuation        [facility] Follow up.   Specialty: Obstetrics and Gynecology Why: As needed in emergencies Contact information: [address] [place]  [phone]                Allergies as of 06/02/2023   No Known Allergies      Medication List     TAKE these medications    labetalol  200 MG tablet Commonly known as: NORMODYNE  Take 1.5 tablets (300 mg total) by mouth 3 (three) times daily. What changed:  how much to take when to take this   PRENATAL VITAMINS PO Take 2 tablets by mouth daily. 2 gummies   Vitamin D  (Ergocalciferol ) 1.25 MG (50000 UNIT) Caps capsule Commonly known as: DRISDOL  Take 1 capsule (50,000 Units total) by mouth every 7 (seven) days.         Felipe Horton, Lorianna Spadaccini , CNM 06/02/2023  10:47 AM

## 2023-06-02 NOTE — Progress Notes (Signed)
 Pt complains of increase thick d/c - would like check today, also having "tail bone" pain.  Pt declines panorama and afp due to results last pregnancy - weight was a factor.

## 2023-06-02 NOTE — MAU Note (Addendum)
 Shelby Chandler is a 33 y.o. at [redacted]w[redacted]d here in MAU reporting: was at office for BP monitoring. sent from office.  Unable to get FH with doppler due to habitus.  Attempted abd US - was unable to FH or fetal movement.  No tech available to TV US . No bleeding or pain.  Onset of complaint: this morning Pain score: none Vitals:   06/02/23 1035  BP: (!) 180/108  Pulse: 60  Resp: 18  Temp: (!) 97.3 F (36.3 C)  SpO2: 100%    Stressed, did take BP meds this morning.  Lab orders placed from triage:  urine obtained, not ordered   Has not been able to get FH with doppler yet with this preg.

## 2023-06-06 ENCOUNTER — Other Ambulatory Visit (HOSPITAL_COMMUNITY): Payer: Self-pay | Admitting: Obstetrics and Gynecology

## 2023-06-06 ENCOUNTER — Telehealth: Payer: Self-pay

## 2023-06-06 ENCOUNTER — Other Ambulatory Visit: Payer: Self-pay

## 2023-06-06 ENCOUNTER — Other Ambulatory Visit: Payer: Self-pay | Admitting: Obstetrics and Gynecology

## 2023-06-06 ENCOUNTER — Encounter (HOSPITAL_COMMUNITY): Payer: Self-pay | Admitting: Obstetrics and Gynecology

## 2023-06-06 DIAGNOSIS — O021 Missed abortion: Secondary | ICD-10-CM

## 2023-06-06 DIAGNOSIS — Z01818 Encounter for other preprocedural examination: Secondary | ICD-10-CM

## 2023-06-06 MED ORDER — DOXYCYCLINE HYCLATE 100 MG IV SOLR
200.0000 mg | INTRAVENOUS | Status: AC
  Administered 2023-06-07: 200 mg via INTRAVENOUS
  Filled 2023-06-06: qty 200

## 2023-06-06 NOTE — Telephone Encounter (Signed)
 Called patient to confirm surgery details. Patient is scheduled for surgery on 06/07/23 @[facility] at 11 am. Patient confirmed the arrival time is 9 am. Pre-op instructions and surgery details were provided by phone, and will be sent to Mychart for confirmation.

## 2023-06-06 NOTE — Progress Notes (Signed)
 SDW CALL  Patient was given pre-op instructions over the phone. The opportunity was given for the patient to ask questions. No further questions asked. Patient verbalized understanding of instructions given.   PCP - Cinda Craze Cardiologist - denies  PPM/ICD - denies   Chest x-ray - denies EKG - DOS Stress Test - denies ECHO - denies Cardiac Cath -  denies  Sleep Study - denies  No DM - pt states that she is pre-diabetic but does not check blood sugar at home  Last dose of GLP1 agonist-  n/a GLP1 instructions:  n/a  Blood Thinner Instructions:  n/a Aspirin  Instructions: na  ERAS Protcol -  NPO   COVID TEST-  n/a   Anesthesia review: no  Patient denies shortness of breath, fever, cough and chest pain over the phone call   All instructions explained to the patient, with a verbal understanding of the material. Patient agrees to go over the instructions while at home for a better understanding.

## 2023-06-07 ENCOUNTER — Ambulatory Visit (HOSPITAL_COMMUNITY)
Admission: RE | Admit: 2023-06-07 | Discharge: 2023-06-07 | Disposition: A | Discharge status: Home / Self Care | Attending: Obstetrics and Gynecology | Admitting: Obstetrics and Gynecology

## 2023-06-07 ENCOUNTER — Ambulatory Visit (HOSPITAL_COMMUNITY): Payer: Self-pay | Admitting: Physician Assistant

## 2023-06-07 ENCOUNTER — Encounter (HOSPITAL_COMMUNITY): Payer: Self-pay | Admitting: Obstetrics and Gynecology

## 2023-06-07 ENCOUNTER — Other Ambulatory Visit: Payer: Self-pay

## 2023-06-07 ENCOUNTER — Encounter (HOSPITAL_COMMUNITY)
Admission: RE | Disposition: A | Discharge status: Home / Self Care | Payer: Self-pay | Source: Home / Self Care | Attending: Obstetrics and Gynecology

## 2023-06-07 ENCOUNTER — Ambulatory Visit (HOSPITAL_COMMUNITY)
Admission: RE | Admit: 2023-06-07 | Discharge: 2023-06-07 | Disposition: A | Discharge status: Home / Self Care | Source: Ambulatory Visit | Attending: Obstetrics and Gynecology | Admitting: Obstetrics and Gynecology

## 2023-06-07 DIAGNOSIS — O021 Missed abortion: Secondary | ICD-10-CM

## 2023-06-07 DIAGNOSIS — Z3A12 12 weeks gestation of pregnancy: Secondary | ICD-10-CM | POA: Diagnosis not present

## 2023-06-07 DIAGNOSIS — Z3A17 17 weeks gestation of pregnancy: Secondary | ICD-10-CM

## 2023-06-07 DIAGNOSIS — Z01818 Encounter for other preprocedural examination: Secondary | ICD-10-CM

## 2023-06-07 DIAGNOSIS — F418 Other specified anxiety disorders: Secondary | ICD-10-CM | POA: Diagnosis not present

## 2023-06-07 DIAGNOSIS — I10 Essential (primary) hypertension: Secondary | ICD-10-CM | POA: Insufficient documentation

## 2023-06-07 HISTORY — PX: OPERATIVE ULTRASOUND: SHX5996

## 2023-06-07 HISTORY — DX: Prediabetes: R73.03

## 2023-06-07 HISTORY — PX: DILATION AND EVACUATION: SHX1459

## 2023-06-07 LAB — CBC
HCT: 46 % (ref 36.0–46.0)
Hemoglobin: 14.1 g/dL (ref 12.0–15.0)
MCH: 24.1 pg — ABNORMAL LOW (ref 26.0–34.0)
MCHC: 30.7 g/dL (ref 30.0–36.0)
MCV: 78.5 fL — ABNORMAL LOW (ref 80.0–100.0)
Platelets: 391 10*3/uL (ref 150–400)
RBC: 5.86 MIL/uL — ABNORMAL HIGH (ref 3.87–5.11)
RDW: 14.1 % (ref 11.5–15.5)
WBC: 5.8 10*3/uL (ref 4.0–10.5)
nRBC: 0 % (ref 0.0–0.2)

## 2023-06-07 LAB — BASIC METABOLIC PANEL WITH GFR
Anion gap: 13 (ref 5–15)
BUN: 10 mg/dL (ref 6–20)
CO2: 18 mmol/L — ABNORMAL LOW (ref 22–32)
Calcium: 9.6 mg/dL (ref 8.9–10.3)
Chloride: 105 mmol/L (ref 98–111)
Creatinine, Ser: 0.85 mg/dL (ref 0.44–1.00)
GFR, Estimated: >60 mL/min (ref >60)
Glucose, Bld: 68 mg/dL — ABNORMAL LOW (ref 70–99)
Potassium: 3.7 mmol/L (ref 3.5–5.1)
Sodium: 136 mmol/L (ref 135–145)

## 2023-06-07 SURGERY — DILATION AND EVACUATION, UTERUS
Anesthesia: General | Site: Uterus

## 2023-06-07 MED ORDER — FENTANYL CITRATE (PF) 250 MCG/5ML IJ SOLN
INTRAMUSCULAR | Status: AC
  Filled 2023-06-07: qty 5

## 2023-06-07 MED ORDER — OXYCODONE HCL 5 MG/5ML PO SOLN: 5.0000 mg | Freq: Once | ORAL | Status: DC | PRN

## 2023-06-07 MED ORDER — IBUPROFEN 800 MG PO TABS: 800.0000 mg | ORAL_TABLET | Freq: Three times a day (TID) | ORAL | 3 refills | Status: AC | PRN

## 2023-06-07 MED ORDER — DEXMEDETOMIDINE HCL IN NACL 80 MCG/20ML IV SOLN
INTRAVENOUS | Status: DC | PRN
  Administered 2023-06-07: 8 ug via INTRAVENOUS

## 2023-06-07 MED ORDER — DEXAMETHASONE SODIUM PHOSPHATE 10 MG/ML IJ SOLN
INTRAMUSCULAR | Status: DC | PRN
  Administered 2023-06-07: 10 mg via INTRAVENOUS

## 2023-06-07 MED ORDER — ORAL CARE MOUTH RINSE: 15.0000 mL | Freq: Once | OROMUCOSAL | Status: AC

## 2023-06-07 MED ORDER — MISOPROSTOL 200 MCG PO TABS
ORAL_TABLET | ORAL | Status: AC
  Filled 2023-06-07: qty 5

## 2023-06-07 MED ORDER — LIDOCAINE 2% (20 MG/ML) 5 ML SYRINGE
INTRAMUSCULAR | Status: DC | PRN
  Administered 2023-06-07: 40 mg via INTRAVENOUS

## 2023-06-07 MED ORDER — MIDAZOLAM HCL 2 MG/2ML IJ SOLN
INTRAMUSCULAR | Status: AC
  Filled 2023-06-07: qty 2

## 2023-06-07 MED ORDER — KETOROLAC TROMETHAMINE 30 MG/ML IJ SOLN
INTRAMUSCULAR | Status: DC | PRN
  Administered 2023-06-07: 30 mg via INTRAVENOUS

## 2023-06-07 MED ORDER — FENTANYL CITRATE (PF) 100 MCG/2ML IJ SOLN
INTRAMUSCULAR | Status: DC | PRN
  Administered 2023-06-07: 100 ug via INTRAVENOUS
  Administered 2023-06-07: 50 ug via INTRAVENOUS

## 2023-06-07 MED ORDER — PROPOFOL 10 MG/ML IV BOLUS
INTRAVENOUS | Status: DC | PRN
Start: 2023-06-07 — End: 2023-06-07
  Administered 2023-06-07: 100 mg via INTRAVENOUS

## 2023-06-07 MED ORDER — TRANEXAMIC ACID-NACL 1000-0.7 MG/100ML-% IV SOLN
INTRAVENOUS | Status: AC
  Filled 2023-06-07: qty 100

## 2023-06-07 MED ORDER — 0.9 % SODIUM CHLORIDE (POUR BTL) OPTIME
TOPICAL | Status: DC | PRN
Start: 2023-06-07 — End: 2023-06-07
  Administered 2023-06-07: 1000 mL

## 2023-06-07 MED ORDER — ONDANSETRON HCL 4 MG/2ML IJ SOLN
INTRAMUSCULAR | Status: AC
  Filled 2023-06-07: qty 2

## 2023-06-07 MED ORDER — PROPOFOL 10 MG/ML IV BOLUS
INTRAVENOUS | Status: AC
  Filled 2023-06-07: qty 20

## 2023-06-07 MED ORDER — OXYCODONE HCL 5 MG PO TABS: 5.0000 mg | ORAL_TABLET | Freq: Once | ORAL | Status: DC | PRN

## 2023-06-07 MED ORDER — LIDOCAINE 2% (20 MG/ML) 5 ML SYRINGE
INTRAMUSCULAR | Status: AC
  Filled 2023-06-07: qty 5

## 2023-06-07 MED ORDER — CHLOROPROCAINE HCL 1 % IJ SOLN
INTRAMUSCULAR | Status: AC
  Filled 2023-06-07: qty 30

## 2023-06-07 MED ORDER — ACETAMINOPHEN 500 MG PO TABS
1000.0000 mg | ORAL_TABLET | Freq: Once | ORAL | Status: AC
  Administered 2023-06-07: 1000 mg via ORAL
  Filled 2023-06-07: qty 2

## 2023-06-07 MED ORDER — POVIDONE-IODINE 10 % EX SWAB
2.0000 | Freq: Once | CUTANEOUS | Status: DC
Start: 2023-06-07 — End: 2023-06-07

## 2023-06-07 MED ORDER — DEXAMETHASONE SODIUM PHOSPHATE 10 MG/ML IJ SOLN
INTRAMUSCULAR | Status: AC
  Filled 2023-06-07: qty 1

## 2023-06-07 MED ORDER — CHLORHEXIDINE GLUCONATE 0.12 % MT SOLN: 15.0000 mL | Freq: Once | OROMUCOSAL | Status: AC

## 2023-06-07 MED ORDER — METHYLERGONOVINE MALEATE 0.2 MG/ML IJ SOLN
INTRAMUSCULAR | Status: AC
  Filled 2023-06-07: qty 1

## 2023-06-07 MED ORDER — CARBOPROST TROMETHAMINE 250 MCG/ML IM SOLN
INTRAMUSCULAR | Status: AC
  Filled 2023-06-07: qty 1

## 2023-06-07 MED ORDER — SUCCINYLCHOLINE CHLORIDE 200 MG/10ML IV SOSY
PREFILLED_SYRINGE | INTRAVENOUS | Status: DC | PRN
Start: 2023-06-07 — End: 2023-06-07
  Administered 2023-06-07: 200 mg via INTRAVENOUS

## 2023-06-07 MED ORDER — MIDAZOLAM HCL 2 MG/2ML IJ SOLN: 0.5000 mg | Freq: Once | INTRAMUSCULAR | Status: DC | PRN

## 2023-06-07 MED ORDER — ONDANSETRON HCL 4 MG/2ML IJ SOLN
INTRAMUSCULAR | Status: DC | PRN
  Administered 2023-06-07: 4 mg via INTRAVENOUS

## 2023-06-07 MED ORDER — HYDROMORPHONE HCL 1 MG/ML IJ SOLN: 0.2500 mg | INTRAMUSCULAR | Status: DC | PRN

## 2023-06-07 MED ORDER — MEPERIDINE HCL 25 MG/ML IJ SOLN: 6.2500 mg | INTRAMUSCULAR | Status: DC | PRN

## 2023-06-07 MED ORDER — MIDAZOLAM HCL 5 MG/5ML IJ SOLN
INTRAMUSCULAR | Status: DC | PRN
  Administered 2023-06-07: 2 mg via INTRAVENOUS

## 2023-06-07 MED ORDER — CHLORHEXIDINE GLUCONATE 0.12 % MT SOLN
OROMUCOSAL | Status: AC
Start: 2023-06-07 — End: 2023-06-07
  Administered 2023-06-07: 15 mL via OROMUCOSAL
  Filled 2023-06-07: qty 15

## 2023-06-07 MED ORDER — LACTATED RINGERS IV SOLN: INTRAVENOUS | Status: DC

## 2023-06-07 MED ORDER — SUGAMMADEX SODIUM 200 MG/2ML IV SOLN
INTRAVENOUS | Status: DC | PRN
  Administered 2023-06-07: 200 mg via INTRAVENOUS

## 2023-06-07 MED ORDER — KETOROLAC TROMETHAMINE 30 MG/ML IJ SOLN
INTRAMUSCULAR | Status: AC
  Filled 2023-06-07: qty 1

## 2023-06-07 MED ORDER — SUCCINYLCHOLINE CHLORIDE 200 MG/10ML IV SOSY
PREFILLED_SYRINGE | INTRAVENOUS | Status: AC
  Filled 2023-06-07: qty 10

## 2023-06-07 MED ORDER — ROCURONIUM 10MG/ML (10ML) SYRINGE FOR MEDFUSION PUMP - OPTIME
INTRAVENOUS | Status: DC | PRN
  Administered 2023-06-07: 10 mg via INTRAVENOUS
  Administered 2023-06-07: 20 mg via INTRAVENOUS

## 2023-06-07 MED ORDER — MISOPROSTOL 100 MCG PO TABS
ORAL_TABLET | ORAL | Status: DC | PRN
  Administered 2023-06-07: 1000 ug

## 2023-06-07 MED ORDER — CHLOROPROCAINE HCL 1 % IJ SOLN
INTRAMUSCULAR | Status: DC | PRN
  Administered 2023-06-07: 10 mL

## 2023-06-07 SURGICAL SUPPLY — 22 items
CATH ROBINSON RED A/P 16FR (CATHETERS) ×2 IMPLANT
FILTER UTR ASPR ASSEMBLY (MISCELLANEOUS) ×2 IMPLANT
GLOVE BIOGEL PI IND STRL 6.5 (GLOVE) ×2 IMPLANT
GLOVE BIOGEL PI IND STRL 7.0 (GLOVE) ×2 IMPLANT
GLOVE SURG SS PI 6.5 STRL IVOR (GLOVE) ×2 IMPLANT
GOWN STRL REUS W/ TWL LRG LVL3 (GOWN DISPOSABLE) ×4 IMPLANT
HOSE CONNECTING 18IN BERKELEY (TUBING) ×2 IMPLANT
KIT BERKELEY 1ST TRI 3/8 NO TR (MISCELLANEOUS) ×2 IMPLANT
KIT BERKELEY 1ST TRIMESTER 3/8 (MISCELLANEOUS) ×2 IMPLANT
NS IRRIG 1000ML POUR BTL (IV SOLUTION) ×2 IMPLANT
PACK VAGINAL MINOR WOMEN LF (CUSTOM PROCEDURE TRAY) ×2 IMPLANT
PAD OB MATERNITY 11 LF (PERSONAL CARE ITEMS) ×2 IMPLANT
SET BERKELEY SUCTION TUBING (SUCTIONS) ×2 IMPLANT
SPIKE FLUID TRANSFER (MISCELLANEOUS) ×2 IMPLANT
TOWEL GREEN STERILE FF (TOWEL DISPOSABLE) ×4 IMPLANT
UNDERPAD 30X36 HEAVY ABSORB (UNDERPADS AND DIAPERS) ×2 IMPLANT
VACURETTE 10 RIGID CVD (CANNULA) IMPLANT
VACURETTE 12 RIGID CVD (CANNULA) IMPLANT
VACURETTE 6 ASPIR F TIP BERK (CANNULA) IMPLANT
VACURETTE 7MM CVD STRL WRAP (CANNULA) IMPLANT
VACURETTE 8 RIGID CVD (CANNULA) IMPLANT
VACURETTE 9 RIGID CVD (CANNULA) IMPLANT

## 2023-06-07 NOTE — Anesthesia Preprocedure Evaluation (Addendum)
 Anesthesia Evaluation  Patient identified by MRN, date of birth, ID band Patient awake    Reviewed: Allergy & Precautions, NPO status , Patient's Chart, lab work & pertinent test results  History of Anesthesia Complications Negative for: history of anesthetic complications  Airway Mallampati: I  TM Distance: >3 FB Neck ROM: Full    Dental  (+) Dental Advisory Given   Pulmonary neg pulmonary ROS   breath sounds clear to auscultation       Cardiovascular hypertension, Pt. on medications (-) angina  Rhythm:Regular Rate:Normal     Neuro/Psych   Anxiety Depression    negative neurological ROS     GI/Hepatic negative GI ROS, Neg liver ROS,,,  Endo/Other    Class 4 obesityBMI 75  Renal/GU negative Renal ROS     Musculoskeletal   Abdominal   Peds  Hematology Hb 14.1, plt 391k   Anesthesia Other Findings   Reproductive/Obstetrics                             Anesthesia Physical Anesthesia Plan  ASA: 3  Anesthesia Plan: General   Post-op Pain Management: Tylenol  PO (pre-op)*   Induction: Intravenous  PONV Risk Score and Plan: 3 and Ondansetron , Dexamethasone , Treatment may vary due to age or medical condition and Scopolamine patch - Pre-op  Airway Management Planned: Oral ETT  Additional Equipment: None  Intra-op Plan:   Post-operative Plan: Extubation in OR  Informed Consent: I have reviewed the patients History and Physical, chart, labs and discussed the procedure including the risks, benefits and alternatives for the proposed anesthesia with the patient or authorized representative who has indicated his/her understanding and acceptance.     Dental advisory given  Plan Discussed with: CRNA and Surgeon  Anesthesia Plan Comments:        Anesthesia Quick Evaluation

## 2023-06-07 NOTE — Anesthesia Postprocedure Evaluation (Signed)
 Anesthesia Post Note  Patient: Domnick Frisk Lua  Procedure(s) Performed: DILATION AND EVACUATION, UTERUS WITH CHROMOSOME STUDIES (Uterus) US  INTRAOPERATIVE (Abdomen)     Patient location during evaluation: PACU Anesthesia Type: General Level of consciousness: awake and alert, patient cooperative and oriented Pain management: pain level controlled Vital Signs Assessment: post-procedure vital signs reviewed and stable Respiratory status: spontaneous breathing, nonlabored ventilation and respiratory function stable Cardiovascular status: blood pressure returned to baseline and stable Postop Assessment: no apparent nausea or vomiting Anesthetic complications: no   No notable events documented.  Last Vitals:  Vitals:   06/07/23 1139 06/07/23 1200  BP: (!) 153/98 139/71  Pulse: 78 64  Resp: 16 18  Temp: 36.5 C   SpO2: 98% 99%    Last Pain:  Vitals:   06/07/23 1200  TempSrc:   PainSc: 0-No pain                 Chiyeko Ferre,E. Amado Andal

## 2023-06-07 NOTE — H&P (Signed)
 Shelby Chandler is an 33 y.o. female G2P0101 at [redacted]w[redacted]d by dates diagnosed with a fetal demise on 4/25, here for scheduled D&E. Patient was diagnosed with a missed abortion measuring 12 weeks at the time of an ROB. Patient denies vaginal bleeding or cramping. She received her prenatal care at [facility] complicated by previous classical cesarean section and morbid obesity with a BMI 74. Patient desires chromosomal studies as fetal anomalies were noted on 06/02/23 ultrasound   Menstrual History: Patient's last menstrual period was 01/31/2023 (exact date).    Past Medical History:  Diagnosis Date   Anemia    Anxiety    Hypertension    Pre-diabetes     Past Surgical History:  Procedure Laterality Date   CESAREAN SECTION  11/21/2020   Procedure: CESAREAN SECTION;  Surgeon: Granville Layer, MD;  Location: [facility] LD [facility]S;  Service: Obstetrics;;   TOOTH EXTRACTION      Family History  Problem Relation Age of Onset   Alcohol abuse Mother    Arthritis Mother    Depression Mother    Drug abuse Mother    Heart attack Mother    Hypertension Mother    Hyperlipidemia Mother    Alcohol abuse Father    Diabetes Father    Drug abuse Father    Hypertension Father    Heart attack Father    Stroke Father    Hypertension Sister    Arthritis Maternal Grandmother    Arthritis Maternal Grandfather    Diabetes Paternal Grandmother     Social History:  reports that she has never smoked. She has never used smokeless tobacco. She reports that she does not currently use alcohol. She reports that she does not use drugs.  Allergies: No Known Allergies  Medications Prior to Admission  Medication Sig Dispense Refill Last Dose/Taking   fluticasone  (FLONASE ) 50 MCG/ACT nasal spray Place 1 spray into both nostrils daily as needed for allergies or rhinitis.   Taking As Needed   labetalol  (NORMODYNE ) 200 MG tablet Take 1.5 tablets (300 mg total) by mouth 3 (three) times daily. 135 tablet 3 Taking    Vitamin D , Ergocalciferol , (DRISDOL ) 1.25 MG (50000 UNIT) CAPS capsule Take 1 capsule (50,000 Units total) by mouth every 7 (seven) days. (Patient not taking: Reported on 06/06/2023) 12 capsule 0 Not Taking    Review of Systems See pertinent in HPI. All other systems reviewed and non contributory Last menstrual period 01/31/2023. Physical Exam GENERAL: Well-developed, well-nourished female in no acute distress.  LUNGS: Clear to auscultation bilaterally.  HEART: Regular rate and rhythm. ABDOMEN: Soft, nontender, obese PELVIC: Deferred to OR EXTREMITIES: No cyanosis, clubbing, or edema, 2+ distal pulses.  No results found for this or any previous visit (from the past 24 hours).  No results found. US  OB LESS THAN 14 WEEKS WITH OB TRANSVAGINAL Result Date: 06/02/2023 CLINICAL DATA:  No fetal heart tones detected in the physician's office. Seventeen weeks and 3 days pregnant by last menstrual period. EXAM: OBSTETRIC <14 WK US  AND TRANSVAGINAL OB US  TECHNIQUE: Both transabdominal and transvaginal ultrasound examinations were performed for complete evaluation of the gestation as well as the maternal uterus, adnexal regions, and pelvic cul-de-sac. Transvaginal technique was performed to assess early pregnancy. COMPARISON:  None Available. FINDINGS: Intrauterine gestational sac: Visualized Yolk sac:  Not visualized Embryo:  Visualized Cardiac Activity: Not visualized Other: Large, bilobed nuchal lucency measuring 1.6 cm in length. CRL:  59.6 mm   12 w   4 d Subchorionic hemorrhage:  None visualized. Maternal uterus/adnexae: The ovaries were not visualized. No free peritoneal fluid. IMPRESSION: 1. Fetal demise at 12 weeks and 4 days' gestation. 2. Bilobed cystic hygroma. Critical Value/emergent results were called by telephone at the time of interpretation on 06/02/2023 at 12:07 pm to provider Dr. Racheal Buddle, who verbally acknowledged these results. Electronically Signed   By: Catherin Closs M.D.   On: 06/02/2023  12:09    Assessment/Plan: 33 yo G2P0101 with a 12w missed abortion here for D&E - Risks, benefits and alternatives were explained including but not limited to risks of bleeding, infection, uterine perforation and damage to adjacent organs - patient verbalized understanding and all questions were answered  Timo Hartwig 06/07/2023, 9:00 AM

## 2023-06-07 NOTE — Discharge Instructions (Signed)
 DO NOT TAKE Tylenol  until after 3:31PM DO NOT TAKE Motrin  until after 5:15pm Post Anesthesia Home Care Instructions  Activity: Get plenty of rest for the remainder of the day. A responsible adult should stay with you for 24 hours following the procedure.  For the next 24 hours, DO NOT: -Drive a car -Advertising copywriter -Drink alcoholic beverages -Take any medication unless instructed by your physician -Make any legal decisions or sign important papers.  Meals: Start with liquid foods such as gelatin or soup. Progress to regular foods as tolerated. Avoid greasy, spicy, heavy foods. If nausea and/or vomiting occur, drink only clear liquids until the nausea and/or vomiting subsides. Call your physician if vomiting continues.  Special Instructions/Symptoms: Your throat may feel dry or sore from the anesthesia or the breathing tube placed in your throat during surgery. If this causes discomfort, gargle with warm salt water. The discomfort should disappear within 24 hours.  If you had a scopolamine patch placed behind your ear for the management of post- operative nausea and/or vomiting:  1. The medication in the patch is effective for 72 hours, after which it should be removed.  Wrap patch in a tissue and discard in the trash. Wash hands thoroughly with soap and water. 2. You may remove the patch earlier than 72 hours if you experience unpleasant side effects which may include dry mouth, dizziness or visual disturbances. 3. Avoid touching the patch. Wash your hands with soap and water after contact with the patch.  Call your surgeon if you experience:   1.  Fever over 101.0. 2.  Inability to urinate. 3.  Nausea and/or vomiting. 4.  Extreme swelling or bruising at the surgical site. 5.  Continued bleeding from the incision. 6.  Increased pain, redness or drainage from the incision. 7.  Problems related to your pain medication. 8. Any change in color, movement and/or sensation 9. Any  problems and/or concerns

## 2023-06-07 NOTE — Op Note (Addendum)
 Shelby Chandler PROCEDURE DATE: 06/07/2023  PREOPERATIVE DIAGNOSIS: 12 week missed abortion. POSTOPERATIVE DIAGNOSIS: The same. PROCEDURE:     Dilation and Evacuation. SURGEON:  Dr. Verlyn Goad  INDICATIONS: 33 y.o. G2P0101 with MAB at 12.[redacted] weeks gestation, needing surgical completion.  Risks of surgery were discussed with the patient including but not limited to: bleeding which may require transfusion; infection which may require antibiotics; injury to uterus or surrounding organs;need for additional procedures including laparotomy or laparoscopy; possibility of intrauterine scarring which may impair future fertility; and other postoperative/anesthesia complications. Written informed consent was obtained.    FINDINGS:  A 16-week size anteverted uterus, moderate amounts of products of conception, specimen sent to pathology.Procedure performed under ultrasound guidance which demonstrated an empty uterus  ANESTHESIA:    Monitored intravenous sedation, paracervical block. INTRAVENOUS FLUIDS:  400 ml of LR ESTIMATED BLOOD LOSS:  500 ml. SPECIMENS:  Products of conception sent to pathology COMPLICATIONS:  None immediate.  PROCEDURE DETAILS:  The patient received intravenous antibiotics while in the preoperative area.  She was then taken to the operating room where general anesthesia was administered and was found to be adequate.  After an adequate timeout was performed, she was placed in the dorsal lithotomy position and examined; then prepped and draped in the sterile manner.   Her bladder was catheterized for an unmeasured amount of clear, yellow urine. A vaginal speculum was then placed in the patient's vagina and a single tooth tenaculum was applied to the anterior lip of the cervix.  A paracervical block using 0.5% Marcaine  was administered. The cervix was gently dilated to accommodate a 12 mm suction curette that was gently advanced to the uterine fundus.  The suction device was then  activated and curette slowly rotated to clear the uterus of products of conception.  A sharp curettage was then performed to confirm complete emptying of the uterus. There was minimal bleeding noted and the tenaculum removed with good hemostasis noted.   All instruments were removed from the patient's vagina. 1000 mcg cytotec  was placed in the rectum. The patient tolerated the procedure well and was taken to the recovery area awake, and in stable condition.  The patient will be discharged to home as per PACU criteria.  Routine postoperative instructions given. She will follow up in the clinic in 2 weeks for postoperative evaluation.

## 2023-06-07 NOTE — Anesthesia Procedure Notes (Signed)
 Procedure Name: Intubation Date/Time: 06/07/2023 10:52 AM  Performed by: Ezzie Holstein, CRNAPre-anesthesia Checklist: Patient identified, Emergency Drugs available, Suction available and Patient being monitored Patient Re-evaluated:Patient Re-evaluated prior to induction Oxygen Delivery Method: Circle System Utilized Preoxygenation: Pre-oxygenation with 100% oxygen Induction Type: IV induction Ventilation: Mask ventilation without difficulty Laryngoscope Size: Mac and 3 Grade View: Grade I Tube type: Oral Tube size: 7.0 mm Number of attempts: 1 Airway Equipment and Method: Stylet and Patient positioned with wedge pillow Placement Confirmation: ETT inserted through vocal cords under direct vision, positive ETCO2 and breath sounds checked- equal and bilateral Secured at: 22 cm Tube secured with: Tape Dental Injury: Teeth and Oropharynx as per pre-operative assessment

## 2023-06-07 NOTE — Transfer of Care (Signed)
 Immediate Anesthesia Transfer of Care Note  Patient: Shelby Chandler  Procedure(s) Performed: DILATION AND EVACUATION, UTERUS WITH CHROMOSOME STUDIES (Uterus) US  INTRAOPERATIVE (Abdomen)  Patient Location: PACU  Anesthesia Type:General  Level of Consciousness: awake, alert , oriented, and patient cooperative  Airway & Oxygen Therapy: Patient Spontanous Breathing  Post-op Assessment: Report given to RN and Post -op Vital signs reviewed and stable  Post vital signs: Reviewed and stable  Last Vitals:  Vitals Value Taken Time  BP 153/98 06/07/23 1139  Temp    Pulse 77 06/07/23 1143  Resp 16 06/07/23 1143  SpO2 95 % 06/07/23 1143  Vitals shown include unfiled device data.  Last Pain:  Vitals:   06/07/23 0925  TempSrc:   PainSc: 0-No pain         Complications: No notable events documented.

## 2023-06-08 ENCOUNTER — Encounter (HOSPITAL_COMMUNITY): Payer: Self-pay | Admitting: Obstetrics and Gynecology

## 2023-06-08 LAB — SURGICAL PATHOLOGY

## 2023-06-14 LAB — ANORA MISCARRIAGE TEST - FRESH

## 2023-06-15 ENCOUNTER — Other Ambulatory Visit (HOSPITAL_COMMUNITY): Payer: Self-pay | Admitting: Obstetrics & Gynecology

## 2023-06-15 ENCOUNTER — Encounter: Payer: Self-pay | Admitting: Obstetrics & Gynecology

## 2023-06-15 DIAGNOSIS — O3510X0 Maternal care for (suspected) chromosomal abnormality in fetus, unspecified, not applicable or unspecified: Secondary | ICD-10-CM

## 2023-06-16 ENCOUNTER — Emergency Department (HOSPITAL_BASED_OUTPATIENT_CLINIC_OR_DEPARTMENT_OTHER)
Admission: EM | Admit: 2023-06-16 | Discharge: 2023-06-16 | Disposition: A | Discharge status: Home / Self Care | Attending: Emergency Medicine | Admitting: Emergency Medicine

## 2023-06-16 ENCOUNTER — Ambulatory Visit: Payer: Self-pay

## 2023-06-16 ENCOUNTER — Encounter (HOSPITAL_BASED_OUTPATIENT_CLINIC_OR_DEPARTMENT_OTHER): Payer: Self-pay | Admitting: Emergency Medicine

## 2023-06-16 ENCOUNTER — Emergency Department (HOSPITAL_BASED_OUTPATIENT_CLINIC_OR_DEPARTMENT_OTHER)

## 2023-06-16 ENCOUNTER — Other Ambulatory Visit: Payer: Self-pay

## 2023-06-16 DIAGNOSIS — B349 Viral infection, unspecified: Secondary | ICD-10-CM

## 2023-06-16 DIAGNOSIS — R6883 Chills (without fever): Secondary | ICD-10-CM | POA: Insufficient documentation

## 2023-06-16 DIAGNOSIS — R0602 Shortness of breath: Secondary | ICD-10-CM | POA: Insufficient documentation

## 2023-06-16 DIAGNOSIS — Z79899 Other long term (current) drug therapy: Secondary | ICD-10-CM | POA: Diagnosis not present

## 2023-06-16 DIAGNOSIS — I517 Cardiomegaly: Secondary | ICD-10-CM | POA: Diagnosis not present

## 2023-06-16 DIAGNOSIS — I10 Essential (primary) hypertension: Secondary | ICD-10-CM | POA: Insufficient documentation

## 2023-06-16 DIAGNOSIS — N898 Other specified noninflammatory disorders of vagina: Secondary | ICD-10-CM | POA: Diagnosis not present

## 2023-06-16 LAB — CBC WITH DIFFERENTIAL/PLATELET
Abs Immature Granulocytes: 0.02 10*3/uL (ref 0.00–0.07)
Basophils Absolute: 0 10*3/uL (ref 0.0–0.1)
Basophils Relative: 1 %
Eosinophils Absolute: 0.3 10*3/uL (ref 0.0–0.5)
Eosinophils Relative: 6 %
HCT: 31.2 % — ABNORMAL LOW (ref 36.0–46.0)
Hemoglobin: 9.7 g/dL — ABNORMAL LOW (ref 12.0–15.0)
Immature Granulocytes: 0 %
Lymphocytes Relative: 24 %
Lymphs Abs: 1.5 10*3/uL (ref 0.7–4.0)
MCH: 24.4 pg — ABNORMAL LOW (ref 26.0–34.0)
MCHC: 31.1 g/dL (ref 30.0–36.0)
MCV: 78.6 fL — ABNORMAL LOW (ref 80.0–100.0)
Monocytes Absolute: 0.6 10*3/uL (ref 0.1–1.0)
Monocytes Relative: 9 %
Neutro Abs: 3.7 10*3/uL (ref 1.7–7.7)
Neutrophils Relative %: 60 %
Platelets: 380 10*3/uL (ref 150–400)
RBC: 3.97 MIL/uL (ref 3.87–5.11)
RDW: 14.2 % (ref 11.5–15.5)
WBC: 6.2 10*3/uL (ref 4.0–10.5)
nRBC: 0 % (ref 0.0–0.2)

## 2023-06-16 LAB — BASIC METABOLIC PANEL WITH GFR
Anion gap: 11 (ref 5–15)
BUN: 9 mg/dL (ref 6–20)
CO2: 22 mmol/L (ref 22–32)
Calcium: 8.8 mg/dL — ABNORMAL LOW (ref 8.9–10.3)
Chloride: 105 mmol/L (ref 98–111)
Creatinine, Ser: 0.84 mg/dL (ref 0.44–1.00)
GFR, Estimated: >60 mL/min (ref >60)
Glucose, Bld: 64 mg/dL — ABNORMAL LOW (ref 70–99)
Potassium: 4 mmol/L (ref 3.5–5.1)
Sodium: 138 mmol/L (ref 135–145)

## 2023-06-16 LAB — RESP PANEL BY RT-PCR (RSV, FLU A&B, COVID)  RVPGX2
Influenza A by PCR: NEGATIVE
Influenza B by PCR: NEGATIVE
Resp Syncytial Virus by PCR: NEGATIVE
SARS Coronavirus 2 by RT PCR: NEGATIVE

## 2023-06-16 LAB — HCG, QUANTITATIVE, PREGNANCY: hCG, Beta Chain, Quant, S: 76 m[IU]/mL — ABNORMAL HIGH (ref <5)

## 2023-06-16 MED ORDER — KETOROLAC TROMETHAMINE 15 MG/ML IJ SOLN
15.0000 mg | Freq: Once | INTRAMUSCULAR | Status: AC
  Administered 2023-06-16: 15 mg via INTRAVENOUS
  Filled 2023-06-16: qty 1

## 2023-06-16 NOTE — Telephone Encounter (Signed)
 Copied from CRM [id number]. Topic: Clinical - Red Word Triage >> Jun 16, 2023 11:28 AM Mag[employer]ene [employer] wrote: Red Word that prompted transfer to Nurse Triage: Shortness of breath, dizzy.   Chief Complaint: dizzines Symptoms: faint, shortness of breath, headach Frequency: constant Pertinent Negatives: Patient denies chest pain Disposition: [] ED /[] Urgent Care (no appt availability in office) / [] Appointment(In office/virtual)/ []  [facility]/ [] Home Care/ [] Refused Recommended Disposition /[] [facility]/ []  Follow-up with PCP Additional Notes: Pt s/p D&E on 4/30, now endorsing feeling faint, dizzy, headache, and shortness of breath on exertion. Pt also reports sinus congestion with pressure behind her eyes. Pt reports checking her BP at work and it 160/100. No avail appts today, RN advsing ED. Pt is agreeable.  Reason for Disposition  Patient sounds very sick or weak to the triager  Answer Assessment - Initial Assessment Questions 1. DESCRIPTION: "Describe your dizziness."     Felt faint and weak  2. LIGHTHEADED: "Do you feel lightheaded?" (e.g., somewhat faint, woozy, weak upon standing)     Woozy  3. VERTIGO: "Do you feel like either you or the room is spinning or tilting?" (i.e. vertigo)     No  4. SEVERITY: "How bad is it?"  "Do you feel like you are going to faint?" "Can you stand and walk?"   - MILD: Feels slightly dizzy, but walking normally.   - MODERATE: Feels unsteady when walking, but not falling; interferes with normal activities (e.g., school, work).   - SEVERE: Unable to walk without falling, or requires assistance to walk without falling; feels like passing out now.      Mild  5. ONSET:  "When did the dizziness begin?"     Last night  6. AGGRAVATING FACTORS: "Does anything make it worse?" (e.g., standing, change in head position)     No  7. HEART RATE: "Can you tell me your heart rate?" "How many beats in 15 seconds?"  (Note: not all patients can do  this)       No  8. CAUSE: "What do you think is causing the dizziness?"     Unsure of cause, but had a D&E on 4/30  9. RECURRENT SYMPTOM: "Have you had dizziness before?" If Yes, ask: "When was the last time?" "What happened that time?"     No  10. OTHER SYMPTOMS: "Do you have any other symptoms?" (e.g., fever, chest pain, vomiting, diarrhea, bleeding)       Headache, pressure behind eyes, shortness of breath when walkin, sinus congestion, sweats  11. PREGNANCY: "Is there any chance you are pregnant?" "When was your last menstrual period?"       no  Protocols used: Dizziness - Lightheadedness-A-AH

## 2023-06-16 NOTE — ED Provider Notes (Signed)
 [place] EMERGENCY DEPARTMENT AT [facility] Provider Note   CSN: [id number] Arrival date & time: 06/16/23  1243     History  Chief Complaint  Patient presents with   Post-op Problem    Shelby Chandler is a 33 y.o. female.  HPI Patient had dilatation evacuation due to miscarriage 10 days ago.  Now feeling worse overall.  Had pressure behind her eyes.  Has had some shortness of breath.  Has had chills without frank fever.  Still continues to have some vaginal discharge.  No real cough.   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 Authorizing Provider  fluticasone  (FLONASE ) 50 MCG/ACT nasal spray Place 1 spray into both nostrils daily as needed for allergies or rhinitis.    [provider]  ibuprofen  (ADVIL ) 800 MG tablet Take 1 tablet (800 mg total) by mouth 3 (three) times daily with meals as needed for headache or moderate pain (pain score 4-6). 06/07/23   Constant, Peggy, MD  labetalol  (NORMODYNE ) 200 MG tablet Take 1.5 tablets (300 mg total) by mouth 3 (three) times daily. 06/02/23   Smith, [name] , CNM  Vitamin D , Ergocalciferol , (DRISDOL ) 1.25 MG (50000 UNIT) CAPS capsule Take 1 capsule (50,000 Units total) by mouth every 7 (seven) days. Patient not taking: Reported on 06/06/2023 01/25/23   [name], MD      Allergies    Patient has no known allergies.    Review of Systems   Review of Systems  Physical Exam Updated Vital Signs BP (!) 174/88   Pulse 76   Temp 98.2 F (36.8 C)   Resp 20   LMP 06/07/2023 (Exact Date)   SpO2 96%   Breastfeeding Unknown  Physical Exam Vitals and nursing note reviewed.  Constitutional:      Appearance: She is obese.  HENT:     Head: Atraumatic.  Cardiovascular:     Rate and Rhythm: Normal rate.  Pulmonary:      Breath sounds: No wheezing.  Abdominal:     Tenderness: There is no abdominal tenderness.  Musculoskeletal:        General: No tenderness.     Cervical back: Neck supple.  Skin:    General: Skin is warm.     Capillary Refill: Capillary refill takes less than 2 seconds.  Neurological:     Mental Status: She is alert and oriented to person, place, and time.     ED Results / Procedures / Treatments   Labs (all labs ordered are listed, but only abnormal results are displayed) Labs Reviewed  RESP PANEL BY RT-PCR (RSV, FLU A&B, COVID)  [id number]  WET PREP, GENITAL  CBC WITH DIFFERENTIAL/PLATELET  BASIC METABOLIC PANEL WITH GFR  HCG, QUANTITATIVE, PREGNANCY    EKG None  Radiology No results found.  Procedures Procedures    Medications Ordered in ED Medications - No data to display  ED Course/ Medical Decision Making/ A&P                                 Medical Decision Making Amount and/or Complexity of Data Reviewed Labs: ordered. Radiology: ordered.   Patient with headache, sinus pressure and some diaphoresis.  Feeling weak.  Did have recent D&E.  Will get basic blood work.  Will get viral testing.  Will get x-ray.  Will do pelvic exam to evaluate for causes such as endometritis.  Pelvic exam showed no blood but no purulence.  Technically somewhat difficult due to patient's body habitus.  Pending blood work and x-ray.  Care turned over to Dr. Florie Husband.          Final Clinical Impression(s) / ED Diagnoses Final diagnoses:  None    Rx / DC Orders ED Discharge Orders     None         Mozell Arias, MD 06/16/23 1442

## 2023-06-16 NOTE — Telephone Encounter (Signed)
Patient has been seen in the ED 

## 2023-06-16 NOTE — ED Triage Notes (Signed)
 Pt reports D&E r/t miscarriage 4/30.  Pt c/o HA, pressure behind eyes starting last night and diaphoresis today. Endorses shob with ambulation today as well

## 2023-06-16 NOTE — ED Notes (Signed)
 Present with provider for pelvic exam

## 2023-06-16 NOTE — Discharge Instructions (Signed)
 While you were in the emergency room, you had blood work done.  At the time of discharge, the only blood work that you had back was your complete blood count, which looks at your hemoglobin and for signs of infection.  Your hemoglobin was lower than previously, but oftentimes after somebody has a surgery there will be some blood loss.  It is important that you follow-up with your primary care doctor within 1 week for repeat blood work to be done.  We will contact you later this evening when all of your blood work has returned and discussed them with you.

## 2023-06-16 NOTE — ED Notes (Signed)
 RN reviewed discharge instructions with pt. Pt verbalized understanding and had no further questions

## 2023-06-16 NOTE — ED Provider Notes (Signed)
  Physical Exam  BP (!) 157/106 (BP Location: Right Arm)   Pulse 72   Temp 98.2 F (36.8 C)   Resp 20   LMP 06/07/2023 (Exact Date)   SpO2 100%   Breastfeeding Unknown   Physical Exam  Procedures  Procedures  ED Course / MDM    Medical Decision Making Amount and/or Complexity of Data Reviewed Labs: ordered. Radiology: ordered.  Risk Prescription drug management.     Melody Spurling, assumed care for this patient.  In brief this is a 33 year old female presenting to the emergency department today due to feeling unwell.  Patient describes sore throat, cough, congestion, fatigue.  Patient had a D&C 10 days ago.  She has no abdominal pain or tenderness.  Patient's blood work did show a decreased hemoglobin, could be consistent with expected blood loss from [facility].  Patient with no active bleeding at this time.  Patient wanted to leave the emergency room prior to her results resulting.  Discussed this with the patient, informed her that I would call her with an update of her results where she was well, check MyChart.  When labs resulted, I called the patient and left a voicemail asking her to call me back, however the patient did not return the call.    Afton Horse T, DO 06/18/23 1519

## 2023-06-18 ENCOUNTER — Encounter: Payer: Self-pay | Admitting: Family Medicine

## 2023-06-20 ENCOUNTER — Ambulatory Visit

## 2023-06-20 ENCOUNTER — Other Ambulatory Visit

## 2023-07-04 ENCOUNTER — Encounter: Payer: Self-pay | Admitting: Obstetrics and Gynecology

## 2023-07-04 ENCOUNTER — Other Ambulatory Visit: Payer: Self-pay | Admitting: Obstetrics and Gynecology

## 2023-07-04 ENCOUNTER — Ambulatory Visit: Admitting: Obstetrics and Gynecology

## 2023-07-04 VITALS — BP 166/104 | HR 82 | Wt >= 6400 oz

## 2023-07-04 DIAGNOSIS — Z9889 Other specified postprocedural states: Secondary | ICD-10-CM

## 2023-07-04 MED ORDER — SLYND 4 MG PO TABS
1.0000 | ORAL_TABLET | Freq: Every day | ORAL | 11 refills | Status: AC
Start: 2023-07-04 — End: ?

## 2023-07-04 NOTE — Progress Notes (Signed)
 Pt is here for follow up after recent D&C. Pt would like to discuss BC options and choices due to her weight.

## 2023-07-04 NOTE — Progress Notes (Signed)
 33 yo here for post op check following D&E for missed abortion on 06/07/23. Patient reports feeling well since her procedure. She has not resumed her menstrual cycle. 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   Pre-diabetes    Past Surgical History:  Procedure Laterality Date   CESAREAN SECTION  11/21/2020   Procedure: CESAREAN SECTION;  Surgeon: Granville Layer, MD;  Location: [facility] LD ORS;  Service: Obstetrics;;   DILATION AND EVACUATION N/A 06/07/2023   Procedure: DILATION AND EVACUATION, UTERUS WITH CHROMOSOME STUDIES;  Surgeon: Verlyn Goad, MD;  Location: [facility] OR;  Service: Gynecology;  Laterality: N/A;   OPERATIVE ULTRASOUND N/A 06/07/2023   Procedure: US  INTRAOPERATIVE;  Surgeon: Verlyn Goad, MD;  Location: [facility] OR;  Service: Gynecology;  Laterality: N/A;   TOOTH EXTRACTION     Family History  Problem Relation Age of Onset   Alcohol abuse Mother    Arthritis Mother    Depression Mother    Drug abuse Mother    Heart attack Mother    Hypertension Mother    Hyperlipidemia Mother    Alcohol abuse Father    Diabetes Father    Drug abuse Father    Hypertension Father    Heart attack Father    Stroke Father    Hypertension Sister    Arthritis Maternal Grandmother    Arthritis Maternal Grandfather    Diabetes Paternal Grandmother    Social History   Tobacco Use   Smoking status: Never   Smokeless tobacco: Never  Vaping Use   Vaping status: Never Used  Substance Use Topics   Alcohol use: Not Currently    Comment: socially   Drug use: No   ROS See pertinent in HPI. All other systems reviewed and non contributory Blood pressure (!) 166/104, pulse 82, weight (!) 457 lb (207.3 kg), last menstrual period 06/07/2023, unknown if currently breastfeeding. GENERAL: Well-developed, well-nourished female in no acute distress.  NEURO: alert and oriented x 3  A/P 33  yo here for post op check s/p D&E - Reviewed pathology results - Reviewed genetic results consistent with Monosomy X - Patient desires to focus on her health and weight loss prior to conceiving again. She opted for pills for contraception- rx slynd provided - RTC prn

## 2023-07-18 ENCOUNTER — Telehealth: Payer: Self-pay | Admitting: Licensed Clinical Social Worker

## 2023-07-18 NOTE — Telephone Encounter (Signed)
 [facility] contacted patient on this date to provide [facility] intro and to schedule [facility] appointment. [facility] left VM.

## 2023-08-31 ENCOUNTER — Emergency Department (HOSPITAL_BASED_OUTPATIENT_CLINIC_OR_DEPARTMENT_OTHER)

## 2023-08-31 ENCOUNTER — Encounter: Payer: Self-pay | Admitting: Family Medicine

## 2023-08-31 ENCOUNTER — Ambulatory Visit (INDEPENDENT_AMBULATORY_CARE_PROVIDER_SITE_OTHER): Admitting: Family Medicine

## 2023-08-31 ENCOUNTER — Emergency Department (HOSPITAL_BASED_OUTPATIENT_CLINIC_OR_DEPARTMENT_OTHER)
Admission: EM | Admit: 2023-08-31 | Discharge: 2023-08-31 | Disposition: A | Discharge status: Home / Self Care | Source: Ambulatory Visit

## 2023-08-31 ENCOUNTER — Encounter (HOSPITAL_BASED_OUTPATIENT_CLINIC_OR_DEPARTMENT_OTHER): Payer: Self-pay

## 2023-08-31 ENCOUNTER — Other Ambulatory Visit: Payer: Self-pay

## 2023-08-31 VITALS — BP 220/114 | HR 100 | Temp 98.4°F | Ht 65.0 in | Wt >= 6400 oz

## 2023-08-31 DIAGNOSIS — R109 Unspecified abdominal pain: Secondary | ICD-10-CM | POA: Insufficient documentation

## 2023-08-31 DIAGNOSIS — R030 Elevated blood-pressure reading, without diagnosis of hypertension: Secondary | ICD-10-CM | POA: Diagnosis not present

## 2023-08-31 DIAGNOSIS — R519 Headache, unspecified: Secondary | ICD-10-CM | POA: Diagnosis not present

## 2023-08-31 DIAGNOSIS — I10 Essential (primary) hypertension: Secondary | ICD-10-CM | POA: Diagnosis not present

## 2023-08-31 DIAGNOSIS — R079 Chest pain, unspecified: Secondary | ICD-10-CM | POA: Diagnosis not present

## 2023-08-31 DIAGNOSIS — Z862 Personal history of diseases of the blood and blood-forming organs and certain disorders involving the immune mechanism: Secondary | ICD-10-CM | POA: Diagnosis not present

## 2023-08-31 DIAGNOSIS — E538 Deficiency of other specified B group vitamins: Secondary | ICD-10-CM

## 2023-08-31 DIAGNOSIS — I160 Hypertensive urgency: Secondary | ICD-10-CM | POA: Insufficient documentation

## 2023-08-31 DIAGNOSIS — N939 Abnormal uterine and vaginal bleeding, unspecified: Secondary | ICD-10-CM

## 2023-08-31 DIAGNOSIS — E66813 Obesity, class 3: Secondary | ICD-10-CM

## 2023-08-31 LAB — CBC
HCT: 36.8 % (ref 36.0–46.0)
Hemoglobin: 11.4 g/dL — ABNORMAL LOW (ref 12.0–15.0)
MCH: 23 pg — ABNORMAL LOW (ref 26.0–34.0)
MCHC: 31 g/dL (ref 30.0–36.0)
MCV: 74.3 fL — ABNORMAL LOW (ref 80.0–100.0)
Platelets: 399 K/uL (ref 150–400)
RBC: 4.95 MIL/uL (ref 3.87–5.11)
RDW: 14.7 % (ref 11.5–15.5)
WBC: 5.5 K/uL (ref 4.0–10.5)
nRBC: 0 % (ref 0.0–0.2)

## 2023-08-31 LAB — COMPREHENSIVE METABOLIC PANEL WITH GFR
ALT: 11 U/L (ref 0–44)
AST: 17 U/L (ref 15–41)
Albumin: 3.8 g/dL (ref 3.5–5.0)
Alkaline Phosphatase: 68 U/L (ref 38–126)
Anion gap: 11 (ref 5–15)
BUN: 9 mg/dL (ref 6–20)
CO2: 25 mmol/L (ref 22–32)
Calcium: 9.1 mg/dL (ref 8.9–10.3)
Chloride: 104 mmol/L (ref 98–111)
Creatinine, Ser: 0.86 mg/dL (ref 0.44–1.00)
GFR, Estimated: >60 mL/min (ref >60)
Glucose, Bld: 87 mg/dL (ref 70–99)
Potassium: 3.7 mmol/L (ref 3.5–5.1)
Sodium: 140 mmol/L (ref 135–145)
Total Bilirubin: 0.2 mg/dL (ref 0.0–1.2)
Total Protein: 7.6 g/dL (ref 6.5–8.1)

## 2023-08-31 LAB — URINALYSIS, ROUTINE W REFLEX MICROSCOPIC

## 2023-08-31 LAB — URINALYSIS, MICROSCOPIC (REFLEX)
Bacteria, UA: NONE SEEN
RBC / HPF: >50 RBC/hpf (ref 0–5)

## 2023-08-31 LAB — TROPONIN T, HIGH SENSITIVITY
Troponin T High Sensitivity: <15 ng/L
Troponin T High Sensitivity: <15 ng/L (ref <19)

## 2023-08-31 LAB — PREGNANCY, URINE: Preg Test, Ur: NEGATIVE

## 2023-08-31 LAB — LIPASE, BLOOD: Lipase: 21 U/L (ref 11–51)

## 2023-08-31 MED ORDER — LABETALOL HCL 100 MG PO TABS
200.0000 mg | ORAL_TABLET | Freq: Once | ORAL | Status: AC
  Administered 2023-08-31: 200 mg via ORAL
  Filled 2023-08-31: qty 2

## 2023-08-31 MED ORDER — LABETALOL HCL 100 MG PO TABS: 100.0000 mg | ORAL_TABLET | Freq: Once | ORAL | Status: DC

## 2023-08-31 MED ORDER — HYDRALAZINE HCL 20 MG/ML IJ SOLN
10.0000 mg | Freq: Once | INTRAMUSCULAR | Status: AC
  Administered 2023-08-31: 10 mg via INTRAVENOUS
  Filled 2023-08-31: qty 1

## 2023-08-31 MED ORDER — ACETAMINOPHEN 325 MG PO TABS
650.0000 mg | ORAL_TABLET | Freq: Once | ORAL | Status: AC
  Administered 2023-08-31: 650 mg via ORAL
  Filled 2023-08-31: qty 2

## 2023-08-31 MED ORDER — KETOROLAC TROMETHAMINE 15 MG/ML IJ SOLN
15.0000 mg | Freq: Once | INTRAMUSCULAR | Status: AC
  Administered 2023-08-31: 15 mg via INTRAVENOUS
  Filled 2023-08-31: qty 1

## 2023-08-31 MED ORDER — LABETALOL HCL 5 MG/ML IV SOLN
20.0000 mg | Freq: Once | INTRAVENOUS | Status: AC
  Administered 2023-08-31: 20 mg via INTRAVENOUS
  Filled 2023-08-31: qty 4

## 2023-08-31 NOTE — Discharge Instructions (Signed)
 You can use Tylenol  and Motrin  as needed for pain.  Is important that you take your antihypertensives as prescribed by primary care and make sure you follow-up with primary care and OB/GYN.  Return to the ER for any new or worsening symptoms.

## 2023-08-31 NOTE — ED Notes (Addendum)
 Report received by previous RN. Pt continues to have elevated BP. Endorses being out of her prescribed BP medication for the past month. Currently c/o headache rating 3/10. Remains A&Ox4, family at bedside. Connected to VS monitor.  Pt also voices concern for intermittent vaginal bright red bleeding. No clots noticed by pt. Denies need for pad/changing chucks. Sts bleeding associated with abdominal cramping. Pt updated in POC and regarding pending lab test. Call bell remains within reach.

## 2023-08-31 NOTE — ED Notes (Signed)
 AVS provided by edp was reviewed with the pt. Pt verbalized understanding of discharge instructions. Pt reports following up with PCP. Pt had BP cuff to monitor BP at home. Pt denies any additional questions. IV removed. Pt going home with s/o at bedside

## 2023-08-31 NOTE — Progress Notes (Signed)
 Established Patient Office Visit   Subjective  Patient ID: Shelby Chandler, female    DOB: February 07, 1991  Age: 33 y.o. MRN: [id number]  Chief Complaint  Patient presents with   Medical Management of Chronic Issues  Patient accompanied by her fianc.  Pt is a 33 year old female initially scheduled for CPE however visit changed to acute.  Patient endorses headaches x 1 week.  States ran out of BP meds at the end of June.  Has yet to pick up refill.  Endorses not feeling well overall status post D&E for missed abortion 06/07/2023.  Patient endorses irregular bleeding all of June noted as brown spotting.  States menses started again July 18.  Currently having intense cramping.    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   Anxiety and depression 08/28/2017   Chronic low back pain without sciatica 08/28/2017   History of anemia 08/28/2017   Essential hypertension 01/09/2017   Class 3 severe obesity due to excess calories without serious comorbidity with body mass index (BMI) of 60.0 to 69.9 in adult 01/09/2017   Past Medical History:  Diagnosis Date   Anemia    Anxiety    Hypertension    Miscarriage    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   Pre-diabetes    Past Surgical History:  Procedure Laterality Date   CESAREAN SECTION  11/21/2020   Procedure: CESAREAN SECTION;  Surgeon: Fredirick Glenys RAMAN, MD;  Location: [facility] LD ORS;  Service: Obstetrics;;    DILATION AND EVACUATION N/A 06/07/2023   Procedure: DILATION AND EVACUATION, UTERUS WITH CHROMOSOME STUDIES;  Surgeon: Alger Gong, MD;  Location: [facility] OR;  Service: Gynecology;  Laterality: N/A;   OPERATIVE ULTRASOUND N/A 06/07/2023   Procedure: US  INTRAOPERATIVE;  Surgeon: Alger Gong, MD;  Location: [facility] OR;  Service: Gynecology;  Laterality: N/A;   TOOTH EXTRACTION     Social History   Tobacco Use   Smoking status: Never   Smokeless tobacco: Never  Vaping Use   Vaping status: Never Used  Substance Use Topics   Alcohol use: Not Currently    Comment: socially   Drug use: No   Family History  Problem Relation Age of Onset   Alcohol abuse Mother    Arthritis Mother    Depression Mother    Drug abuse Mother    Heart attack Mother    Hypertension Mother    Hyperlipidemia Mother    Alcohol abuse Father    Diabetes Father    Drug abuse Father    Hypertension Father    Heart attack Father    Stroke Father    Hypertension Sister    Arthritis Maternal Grandmother    Arthritis Maternal Grandfather    Diabetes Paternal Grandmother    No Known Allergies  ROS Negative unless stated above    Objective:     BP (!) 220/114 (BP Location: Left Wrist, Patient Position: Sitting, Cuff Size: Normal)   Pulse 100   Temp 98.4 F (36.9  C) (Oral)   Ht 5' 5 (1.651 m)   Wt (!) 453 lb 9.6 oz (205.8 kg)   LMP 08/25/2023 (Exact Date)   SpO2 98%   Breastfeeding No   BMI 75.48 kg/m  BP Readings from Last 3 Encounters:  07/04/23 (!) 166/104  06/16/23 (!) 157/106  06/07/23 139/71   Wt Readings from Last 3 Encounters:  08/31/23 (!) 453 lb 9.6 oz (205.8 kg)  07/04/23 (!) 457 lb (207.3 kg)  06/07/23 (!) 447 lb (202.8 kg)      Physical Exam Constitutional:      General: She is not in acute distress.    Appearance: Normal appearance. She is obese.  HENT:     Head: Normocephalic and atraumatic.     Nose: Nose normal.     Mouth/Throat:     Mouth: Mucous membranes are moist.   Cardiovascular:     Rate and Rhythm: Normal rate and regular rhythm.     Heart sounds: Murmur heard.     Systolic murmur is present with a grade of 2/6.     No gallop.  Pulmonary:     Effort: Pulmonary effort is normal. No respiratory distress.     Breath sounds: Normal breath sounds. No wheezing, rhonchi or rales.  Skin:    General: Skin is warm and dry.  Neurological:     Mental Status: She is alert and oriented to person, place, and time.        04/25/2023    9:49 AM 03/08/2023    4:34 PM 03/08/2023    4:33 PM  Depression screen PHQ 2/9  Decreased Interest 0 0 0  Down, Depressed, Hopeless 0 0 0  PHQ - 2 Score 0 0 0  Altered sleeping 2 0 0  Tired, decreased energy 2 3 0  Change in appetite 2 3 0  Feeling bad or failure about yourself  0 0 3  Trouble concentrating 0 0 3  Moving slowly or fidgety/restless 0 0   Suicidal thoughts 0 0   PHQ-9 Score 6 6 6   Difficult doing work/chores  Somewhat difficult       04/25/2023    9:50 AM 03/08/2023    4:35 PM 01/25/2023   11:23 AM 08/08/2022    9:51 AM  GAD 7 : Generalized Anxiety Score  Nervous, Anxious, on Edge 0 0 0 0  Control/stop worrying 0 0 0 0  Worry too much - different things 1 0 0 0  Trouble relaxing 1 0 0 0  Restless 0 0 0 0  Easily annoyed or irritable 2 0 0 0  Afraid - awful might happen 0 0 0 0  Total GAD 7 Score 4 0 0 0  Anxiety Difficulty  Not difficult at all Not difficult at all Not difficult at all     No results found for any visits on 08/31/23.    Assessment & Plan:   Hypertensive urgency  Essential hypertension  Morbid obesity (HCC)  History of anemia  Abnormal uterine bleeding (AUB)  Patient with hypertensive urgency/emergency as BP 220/114 in clinic with headache and general unwell feeling.  Given symptoms patient advised to proceed to nearest ED.    Reschedule CPE.  No follow-ups on file.   Clotilda JONELLE Single, MD

## 2023-08-31 NOTE — ED Provider Notes (Signed)
 [place] EMERGENCY DEPARTMENT AT [facility] Provider Note   CSN: [id number] Arrival date & time: 08/31/23  1704     Patient presents with: Hypertension and Abdominal Cramping (/)   Shelby Chandler is a 33 y.o. female.   33 year old female presents for evaluation of elevated blood pressure.  States she was at her primary care doctor's office and was sent in for possible hypertensive urgency.  States she has had high blood pressure for the last few weeks, she is stopped taking her labetalol  after she had a D&C.  States she has been on labetalol  for a while.  States she took it until she ran out.  She does admit to intermittent headache as well as generalized fatigue and bodyaches.  Denies any chest pain, shortness of breath, vision changes, syncope, or any other symptoms or concern time.   Hypertension Associated symptoms include headaches. Pertinent negatives include no chest pain, no abdominal pain and no shortness of breath.  Abdominal Cramping Associated symptoms include headaches. Pertinent negatives include no chest pain, no abdominal pain and no shortness of breath.       Prior to Admission medications   Medication Sig Start Date End Date Taking? Authorizing Provider  acetaminophen  (TYLENOL ) 500 MG tablet Take 500 mg by mouth every 6 (six) hours as needed.    [provider]  Drospirenone  (SLYND ) 4 MG TABS Take 1 tablet (4 mg total) by mouth daily. 07/04/23   Constant, Peggy, MD  fluticasone  (FLONASE ) 50 MCG/ACT nasal spray Place 1 spray into both nostrils daily as needed for allergies or rhinitis.    [provider]  ibuprofen  (ADVIL ) 800 MG tablet Take 1 tablet (800 mg total) by mouth 3 (three) times daily with meals as needed for headache or moderate pain (pain score 4-6). 06/07/23   Constant, Peggy, MD  labetalol  (NORMODYNE ) 200 MG tablet Take 1.5 tablets (300 mg total) by mouth 3 (three) times daily. 06/02/23   Smith, [name] , CNM   Vitamin D , Ergocalciferol , (DRISDOL ) 1.25 MG (50000 UNIT) CAPS capsule Take 1 capsule (50,000 Units total) by mouth every 7 (seven) days. 01/25/23   [name], MD    Allergies: Patient has no known allergies.    Review of Systems  Constitutional:  Negative for chills and fever.  HENT:  Negative for ear pain and sore throat.   Eyes:  Negative for pain and visual disturbance.  Respiratory:  Negative for cough and shortness of breath.   Cardiovascular:  Negative for chest pain and palpitations.  Gastrointestinal:  Negative for abdominal pain and vomiting.  Genitourinary:  Negative for dysuria and hematuria.  Musculoskeletal:  Negative for arthralgias and back pain.  Skin:  Negative for color change and rash.  Neurological:  Positive for headaches. Negative for seizures and syncope.  All other systems reviewed and are negative.   Updated Vital Signs BP (!) 184/106   Pulse 85   Temp 98 F (36.7 C) (Oral)   Resp 18   Ht 5' 5 (1.651 m)   Wt (!) 205.5 kg   LMP 08/25/2023 (Exact Date)   SpO2 100%   BMI 75.38 kg/m   Physical Exam Vitals and nursing note reviewed.  Constitutional:      General: She is not in acute distress.    Appearance: Normal appearance. She is well-developed. She is not ill-appearing.  HENT:     Head: Normocephalic and atraumatic.  Eyes:     Conjunctiva/sclera: Conjunctivae normal.  Cardiovascular:  Rate and Rhythm: Normal rate and regular rhythm.     Heart sounds: No murmur heard. Pulmonary:     Effort: Pulmonary effort is normal. No respiratory distress.     Breath sounds: Normal breath sounds.  Abdominal:     Palpations: Abdomen is soft.     Tenderness: There is no abdominal tenderness.  Musculoskeletal:        General: No swelling.     Cervical back: Neck supple.  Skin:    General: Skin is warm and dry.     Capillary Refill: Capillary refill takes less than 2 seconds.  Neurological:     General: No focal deficit present.     Mental  Status: She is alert.  Psychiatric:        Mood and Affect: Mood normal.     (all labs ordered are listed, but only abnormal results are displayed) Labs Reviewed  CBC - Abnormal; Notable for the following components:      Result Value   Hemoglobin 11.4 (*)    MCV 74.3 (*)    MCH 23.0 (*)    All other components within normal limits  URINALYSIS, ROUTINE W REFLEX MICROSCOPIC - Abnormal; Notable for the following components:   Color, Urine RED (*)    APPearance HAZY (*)    Glucose, UA   (*)    Value: TEST NOT REPORTED DUE TO COLOR INTERFERENCE OF URINE PIGMENT   Hgb urine dipstick   (*)    Value: TEST NOT REPORTED DUE TO COLOR INTERFERENCE OF URINE PIGMENT   Bilirubin Urine   (*)    Value: TEST NOT REPORTED DUE TO COLOR INTERFERENCE OF URINE PIGMENT   Ketones, ur   (*)    Value: TEST NOT REPORTED DUE TO COLOR INTERFERENCE OF URINE PIGMENT   Protein, ur   (*)    Value: TEST NOT REPORTED DUE TO COLOR INTERFERENCE OF URINE PIGMENT   Nitrite   (*)    Value: TEST NOT REPORTED DUE TO COLOR INTERFERENCE OF URINE PIGMENT   Leukocytes,Ua   (*)    Value: TEST NOT REPORTED DUE TO COLOR INTERFERENCE OF URINE PIGMENT   All other components within normal limits  LIPASE, BLOOD  COMPREHENSIVE METABOLIC PANEL WITH GFR  PREGNANCY, URINE  URINALYSIS, MICROSCOPIC (REFLEX)  TROPONIN T, HIGH SENSITIVITY  TROPONIN T, HIGH SENSITIVITY    EKG: EKG Interpretation Date/Time:  Thursday August 31 2023 18:19:05 EDT Ventricular Rate:  84 PR Interval:  178 QRS Duration:  105 QT Interval:  372 QTC Calculation: 440 R Axis:   51  Text Interpretation: Sinus rhythm Low voltage, precordial leads Consider anterior infarct No significant change when compared to prior EKG from 08/31/2023 Confirmed by Gennaro Bouchard ([id number]) on 08/31/2023 6:35:30 PM  Radiology: [facility] Chest 1 View Result Date: 08/31/2023 CLINICAL DATA:  Chest pain.  Hypertension. EXAM: CHEST  1 VIEW COMPARISON:  06/16/2023 FINDINGS: The heart size  and mediastinal contours are within normal limits. Both lungs are clear. The visualized skeletal structures are unremarkable. IMPRESSION: No active disease. Electronically Signed   By: Norleen DELENA Kil M.D.   On: 08/31/2023 19:10   CT Head Wo Contrast Result Date: 08/31/2023 CLINICAL DATA:  Headache.  Uncontrolled hypertension. EXAM: CT HEAD WITHOUT CONTRAST TECHNIQUE: Contiguous axial images were obtained from the base of the skull through the vertex without intravenous contrast. RADIATION DOSE REDUCTION: This exam was performed according to the departmental dose-optimization program which includes automated exposure control, adjustment of the mA and/or kV according  to patient size and/or use of iterative reconstruction technique. COMPARISON:  08/07/2020 FINDINGS: Brain: No evidence of intracranial hemorrhage, acute infarction, hydrocephalus, extra-axial collection, or mass lesion/mass effect. Vascular:  No hyperdense vessel or other acute findings. Skull: No evidence of fracture or other significant bone abnormality. Sinuses/Orbits:  No acute findings. Other: None. IMPRESSION: Negative noncontrast head CT. Electronically Signed   By: Norleen DELENA Kil M.D.   On: 08/31/2023 19:09     Procedures   Medications Ordered in the ED  labetalol  (NORMODYNE ) injection 20 mg (20 mg Intravenous Given 08/31/23 1820)  acetaminophen  (TYLENOL ) tablet 650 mg (650 mg Oral Given 08/31/23 1820)  labetalol  (NORMODYNE ) injection 20 mg (20 mg Intravenous Given 08/31/23 1849)  hydrALAZINE  (APRESOLINE ) injection 10 mg (10 mg Intravenous Given 08/31/23 1910)  labetalol  (NORMODYNE ) tablet 200 mg (200 mg Oral Given 08/31/23 1916)  ketorolac  (TORADOL ) 15 MG/ML injection 15 mg (15 mg Intravenous Given 08/31/23 1953)                                    Medical Decision Making Cardiac monitor interpretation: Sinus rhythm, no ectopy  Patient here for elevated blood pressure.  She has some headache and is complaining of menstrual cramping  that is worse than usual.  She recently had a D&C.  Stopped taking her labetalol  after that.  She was given multiple doses of IV labetalol  hydralazine  and a dose of p.o. labetalol  as well as Toradol  for pain and did have greatly improved blood pressure.  Imaging and lab workup unremarkable.  I did offer admission but she declined like to go home I think this is reasonable as her blood pressure has improved and she is fairly asymptomatic from it.  Patient strongly advised to pick up her labetalol  from the pharmacy and to take it as prescribed.  Advise close follow-up with primary care and OB/GYN and use Tylenol  Motrin  as needed for pain.  Advise return to the ER for any new or worsening symptoms.  She feels comfortable being discharged home.  Problems Addressed: Abdominal cramping: acute illness or injury Hypertensive urgency: acute illness or injury that poses a threat to life or bodily functions Nonintractable headache, unspecified chronicity pattern, unspecified headache type: acute illness or injury  Amount and/or Complexity of Data Reviewed External Data Reviewed: notes.    Details: Outpatient records reviewed and patient was seen in family medicine office earlier today and sent to the ER for elevated blood pressure Labs: ordered. Decision-making details documented in ED Course.    Details: Ordered and reviewed by me and unremarkable for any acute abnormality, troponins negative x 2 Radiology: ordered and independent interpretation performed. Decision-making details documented in ED Course.    Details: Ordered and interpreted by me independently of radiology Chest x-ray: Shows no acute abnormality CT head: Shows no acute abnormality ECG/medicine tests: ordered and independent interpretation performed. Decision-making details documented in ED Course.    Details: Ordered and interpreted by me in the absence of cardiology and shows sinus rhythm, no STEMI no acute abnormality  Risk OTC  drugs. Prescription drug management. Drug therapy requiring intensive monitoring for toxicity. Diagnosis or treatment significantly limited by social determinants of health.     Final diagnoses:  Hypertensive urgency  Abdominal cramping  Nonintractable headache, unspecified chronicity pattern, unspecified headache type    ED Discharge Orders     None          Keigo Whalley  L, DO 08/31/23 2238

## 2023-08-31 NOTE — ED Notes (Signed)
 Additional dose of po labetalol  given per MD order. Confirmed dose with EDP following additional orders previously given for BP management.

## 2023-08-31 NOTE — ED Triage Notes (Signed)
 Pt reports being at her physical today and blood pressure was over 200 systolic. Pt reports recently having a DNC in May (roughly [redacted] weeks pregnant). Pt endorses groin cramping that radiates to umbilicus. Pt also told by PCP that she had a murmur. Pt reports HA x1 week.

## 2023-09-05 ENCOUNTER — Emergency Department (HOSPITAL_BASED_OUTPATIENT_CLINIC_OR_DEPARTMENT_OTHER)

## 2023-09-05 ENCOUNTER — Other Ambulatory Visit: Payer: Self-pay

## 2023-09-05 ENCOUNTER — Emergency Department (HOSPITAL_BASED_OUTPATIENT_CLINIC_OR_DEPARTMENT_OTHER)
Admission: EM | Admit: 2023-09-05 | Discharge: 2023-09-05 | Disposition: A | Discharge status: Home / Self Care | Attending: Emergency Medicine | Admitting: Emergency Medicine

## 2023-09-05 ENCOUNTER — Encounter (HOSPITAL_BASED_OUTPATIENT_CLINIC_OR_DEPARTMENT_OTHER): Payer: Self-pay

## 2023-09-05 DIAGNOSIS — R109 Unspecified abdominal pain: Secondary | ICD-10-CM | POA: Diagnosis not present

## 2023-09-05 DIAGNOSIS — N939 Abnormal uterine and vaginal bleeding, unspecified: Secondary | ICD-10-CM | POA: Insufficient documentation

## 2023-09-05 DIAGNOSIS — R1084 Generalized abdominal pain: Secondary | ICD-10-CM

## 2023-09-05 DIAGNOSIS — R102 Pelvic and perineal pain: Secondary | ICD-10-CM | POA: Diagnosis not present

## 2023-09-05 DIAGNOSIS — I10 Essential (primary) hypertension: Secondary | ICD-10-CM | POA: Diagnosis not present

## 2023-09-05 DIAGNOSIS — O26891 Other specified pregnancy related conditions, first trimester: Secondary | ICD-10-CM | POA: Diagnosis not present

## 2023-09-05 LAB — CBC
HCT: 34.9 % — ABNORMAL LOW (ref 36.0–46.0)
Hemoglobin: 10.7 g/dL — ABNORMAL LOW (ref 12.0–15.0)
MCH: 22.8 pg — ABNORMAL LOW (ref 26.0–34.0)
MCHC: 30.7 g/dL (ref 30.0–36.0)
MCV: 74.4 fL — ABNORMAL LOW (ref 80.0–100.0)
Platelets: 398 K/uL (ref 150–400)
RBC: 4.69 MIL/uL (ref 3.87–5.11)
RDW: 14.9 % (ref 11.5–15.5)
WBC: 5.4 K/uL (ref 4.0–10.5)
nRBC: 0 % (ref 0.0–0.2)

## 2023-09-05 LAB — COMPREHENSIVE METABOLIC PANEL WITH GFR
ALT: 12 U/L (ref 0–44)
AST: 15 U/L (ref 15–41)
Albumin: 4 g/dL (ref 3.5–5.0)
Alkaline Phosphatase: 67 U/L (ref 38–126)
Anion gap: 10 (ref 5–15)
BUN: 5 mg/dL — ABNORMAL LOW (ref 6–20)
CO2: 25 mmol/L (ref 22–32)
Calcium: 9.1 mg/dL (ref 8.9–10.3)
Chloride: 103 mmol/L (ref 98–111)
Creatinine, Ser: 0.95 mg/dL (ref 0.44–1.00)
GFR, Estimated: >60 mL/min (ref >60)
Glucose, Bld: 99 mg/dL (ref 70–99)
Potassium: 3.8 mmol/L (ref 3.5–5.1)
Sodium: 139 mmol/L (ref 135–145)
Total Bilirubin: 0.3 mg/dL (ref 0.0–1.2)
Total Protein: 7.9 g/dL (ref 6.5–8.1)

## 2023-09-05 LAB — URINALYSIS, ROUTINE W REFLEX MICROSCOPIC
Bilirubin Urine: NEGATIVE
Glucose, UA: NEGATIVE mg/dL
Nitrite: NEGATIVE
Protein, ur: NEGATIVE mg/dL
Specific Gravity, Urine: 1.019 (ref 1.005–1.030)
pH: 6.5 (ref 5.0–8.0)

## 2023-09-05 LAB — PREGNANCY, URINE: Preg Test, Ur: NEGATIVE

## 2023-09-05 LAB — LIPASE, BLOOD: Lipase: 14 U/L (ref 11–51)

## 2023-09-05 LAB — HCG, QUANTITATIVE, PREGNANCY: hCG, Beta Chain, Quant, S: <1 m[IU]/mL

## 2023-09-05 MED ORDER — PANTOPRAZOLE SODIUM 40 MG IV SOLR
40.0000 mg | Freq: Once | INTRAVENOUS | Status: AC
  Administered 2023-09-05: 40 mg via INTRAVENOUS
  Filled 2023-09-05: qty 10

## 2023-09-05 MED ORDER — OMEPRAZOLE 20 MG PO CPDR: 20.0000 mg | DELAYED_RELEASE_CAPSULE | Freq: Every day | ORAL | 0 refills | Status: AC

## 2023-09-05 MED ORDER — AMLODIPINE BESYLATE 10 MG PO TABS: 10.0000 mg | ORAL_TABLET | Freq: Every day | ORAL | 0 refills | Status: AC

## 2023-09-05 MED ORDER — AMLODIPINE BESYLATE 5 MG PO TABS
5.0000 mg | ORAL_TABLET | Freq: Once | ORAL | Status: AC
  Administered 2023-09-05: 5 mg via ORAL
  Filled 2023-09-05: qty 1

## 2023-09-05 MED ORDER — ALUM & MAG HYDROXIDE-SIMETH 200-200-20 MG/5ML PO SUSP
15.0000 mL | Freq: Once | ORAL | Status: AC
  Administered 2023-09-05: 15 mL via ORAL
  Filled 2023-09-05: qty 30

## 2023-09-05 MED ORDER — FENTANYL CITRATE PF 50 MCG/ML IJ SOSY
50.0000 ug | PREFILLED_SYRINGE | Freq: Once | INTRAMUSCULAR | Status: AC
  Administered 2023-09-05: 50 ug via INTRAVENOUS
  Filled 2023-09-05: qty 1

## 2023-09-05 MED ORDER — DICYCLOMINE HCL 20 MG PO TABS: 20.0000 mg | ORAL_TABLET | Freq: Two times a day (BID) | ORAL | 0 refills | Status: AC

## 2023-09-05 NOTE — ED Provider Notes (Addendum)
  Physical Exam  BP (!) 178/163   Pulse 76   Temp 98.2 F (36.8 C) (Oral)   Resp 18   LMP 08/25/2023 (Exact Date)   SpO2 100%   Physical Exam  Procedures  Procedures  ED Course / MDM    Medical Decision Making Amount and/or Complexity of Data Reviewed Labs: ordered. Radiology: ordered.  Risk OTC drugs. Prescription drug management.   LILLETTE Fairy Gravely, assumed care for this patient.  In brief 33 year old female here today for intermittent vaginal bleeding, lower abdominal pain.,  Had a D&C at the end of April.  Patient reported that she had a menstrual cycle in May, did not have 1 in June, started bleeding on 18 July.  Patient signed out pending ultrasound which showed small heterogeneous mass within the endometrium.  Discussed this with the on-call gynecologist, Dr. Zina, who reviewed the patient's imaging, blood work.  Did not believe that this was likely retained products of conception given the absence of fever, negative hCG, and of being this far out.  They are recommending ED follow-up.  Dr. Zina is going to reach out to Dr. Alger to help arrange a follow-up appointment with the patient.  I discussed this with the patient, she was concerned that she was still having some pain.  Patient had been taking moderate amount of ibuprofen  since her symptoms began.  Ordered Protonix , Maalox for the patient.  Also ordered CT imaging of the abdomen pelvis.  Shortly thereafter, nursing staff informing the patient wish to speak with me further.  Went into see the patient, she stated that since her daughter was now awake, she would prefer to go home.  She had reached out to the OB/GYN office, and they were arranging for follow-up for her.  Discussed with the patient my recommendation would be to reduce her ibuprofen , start taking a PPI, prescribed Bentyl .  Will discharge patient.       Gravely Fairy T, DO 09/05/23 1002    Gravely Fairy T, DO 09/05/23 1049

## 2023-09-05 NOTE — ED Provider Notes (Signed)
 [place] EMERGENCY DEPARTMENT AT [facility] Provider Note   CSN: [id number] Arrival date & time: 09/05/23  9462     Patient presents with: Abdominal Pain   Shelby Chandler is a 33 y.o. female.    Abdominal Pain    33 year old female with medical history significant for HTN, anemia, anxiety, DNC in May presenting to the emergency department with abdominal cramping.  The patient states that she is having increasing menstrual cramping that is worse than normal.  She endorses sharp pain in her lower abdomen.  She last saw OBGYN outpatient on 07/04/23, saw Dr. Alger.  She has had some vaginal spotting.  Her D&C was back in May of this year.  She denies any abnormal vaginal discharge.  No dysuria or frequency.  No fevers or chills.  Prior to Admission medications   Medication Sig Start Date End Date Taking? Authorizing Provider  acetaminophen  (TYLENOL ) 500 MG tablet Take 500 mg by mouth every 6 (six) hours as needed.    [provider]  Drospirenone  (SLYND ) 4 MG TABS Take 1 tablet (4 mg total) by mouth daily. 07/04/23   Constant, Peggy, MD  fluticasone  (FLONASE ) 50 MCG/ACT nasal spray Place 1 spray into both nostrils daily as needed for allergies or rhinitis.    [provider]  ibuprofen  (ADVIL ) 800 MG tablet Take 1 tablet (800 mg total) by mouth 3 (three) times daily with meals as needed for headache or moderate pain (pain score 4-6). 06/07/23   Constant, Peggy, MD  labetalol  (NORMODYNE ) 200 MG tablet Take 1.5 tablets (300 mg total) by mouth 3 (three) times daily. 06/02/23   Smith, [name] , CNM  Vitamin D , Ergocalciferol , (DRISDOL ) 1.25 MG (50000 UNIT) CAPS capsule Take 1 capsule (50,000 Units total) by mouth every 7 (seven) days. 01/25/23   [name], MD    Allergies: Patient has no known allergies.    Review of Systems  Genitourinary:  Positive for menstrual problem and pelvic pain.  All other systems reviewed and are negative.   Updated  Vital Signs LMP 08/25/2023 (Exact Date)   Physical Exam Vitals and nursing note reviewed.  Constitutional:      General: She is not in acute distress.    Appearance: She is well-developed. She is obese.  HENT:     Head: Normocephalic and atraumatic.  Eyes:     Conjunctiva/sclera: Conjunctivae normal.  Cardiovascular:     Rate and Rhythm: Normal rate and regular rhythm.     Heart sounds: No murmur heard. Pulmonary:     Effort: Pulmonary effort is normal. No respiratory distress.     Breath sounds: Normal breath sounds.  Abdominal:     Palpations: Abdomen is soft.     Tenderness: There is no abdominal tenderness.  Musculoskeletal:        General: No swelling.     Cervical back: Neck supple.  Skin:    General: Skin is warm and dry.     Capillary Refill: Capillary refill takes less than 2 seconds.  Neurological:     Mental Status: She is alert.  Psychiatric:        Mood and Affect: Mood normal.     (all labs ordered are listed, but only abnormal results are displayed) Labs Reviewed  LIPASE, BLOOD  COMPREHENSIVE METABOLIC PANEL WITH GFR  CBC  URINALYSIS, ROUTINE W REFLEX MICROSCOPIC  PREGNANCY, URINE    EKG: None  Radiology: No results found.   Procedures   Medications Ordered in the ED -  No data to display                                  Medical Decision Making Amount and/or Complexity of Data Reviewed Labs: ordered. Radiology: ordered.  Risk Prescription drug management.    33 year old female with medical history significant for HTN, anemia, anxiety, DNC in May presenting to the emergency department with abdominal cramping.  The patient states that she is having increasing menstrual cramping that is worse than normal.  She endorses sharp pain in her lower abdomen.  She last saw OBGYN outpatient on 07/04/23, saw Dr. Alger.  She has had some vaginal spotting.  Her D&C was back in May of this year.  She denies any abnormal vaginal discharge.  No dysuria  or frequency.  No fevers or chills.  On arrival, the patient was afebrile, not tachycardic heart rate 89, hypertensive BP 211/113, respiratory rate 20, saturating 100% on room air.  Patient denies chest pain, shortness of breath, headache or vision changes, will obtain screening labs and ultrasound imaging as patient is now endorsing menstrual cramping.  No dysuria or frequency.  She denies vaginal bleeding.  Pain is sharp and located across the lower abdomen in the pelvis.  Differential diagnosis includes heavy menstrual cramping, uterine fibroids, ovarian torsion less likely, PID, tubo-ovarian abscess less likely, less likely other acute intra-abdominal abnormality. Pain has been ongoing since 7/18.   Labs: CBC without a leukocytosis, hemoglobin at baseline of 10.7, CMP generally unremarkable, lipase normal, urinalysis and urine pregnancy pending.  Pelvic ultrasound ordered and pending as well.  Plan to follow-up results of diagnostic testing, reassess the patient, ultimate disposition pending results of diagnostic testing and reassessment. Signout given to Dr. Mannie at 0700.     Final diagnoses:  None    ED Discharge Orders     None          Jerrol Agent, MD 09/05/23 [phone]

## 2023-09-05 NOTE — ED Triage Notes (Signed)
 Pt presents via POV c/o abd pain that started on 7/18 after having a D&C. Pt denies vaginal bleeding. Reports pain is sharp in nature.

## 2023-09-05 NOTE — Discharge Instructions (Signed)
 Like we discussed, your transvaginal ultrasound showed an area that was concerning for possibly retained products, but after discussion with your OB/GYN group, we think that this is less likely at this time.  You should still follow-up with them for repeat testing.  They will reach out to you for a follow-up appointment.  Regarding some of the other discomfort that you have been having, I recommend reducing your ibuprofen  use.  You may take 1000 mg of Tylenol  every 8 hours instead.  You can also take omeprazole , 20 mg each day.  I also recommend taking Bentyl  as needed for abdominal pain and cramping.  Regarding your blood pressure, I have sent you a prescription for amlodipine .  You may begin taking 10 mg each day for the next 30 days.  Follow-up with your primary care doctor for further management of your blood pressure.

## 2023-09-18 ENCOUNTER — Ambulatory Visit: Admitting: Obstetrics and Gynecology

## 2023-11-24 ENCOUNTER — Other Ambulatory Visit: Payer: Self-pay | Admitting: Family Medicine

## 2023-11-24 DIAGNOSIS — I10 Essential (primary) hypertension: Secondary | ICD-10-CM

## 2023-11-28 ENCOUNTER — Telehealth: Payer: Self-pay

## 2023-11-28 NOTE — Telephone Encounter (Signed)
 Copied from CRM #[id number]. Topic: Clinical - Medication Question >> Nov 27, 2023  4:18 PM Anairis L wrote: Reason for CRM: Patient is following up on Pending Labetalol  HCl 100 mg Oral 2 times daily   Please advise.

## 2023-11-29 NOTE — Telephone Encounter (Signed)
 E2c2 agent will let pt know can take up to 3 business days

## 2023-11-30 NOTE — Telephone Encounter (Signed)
 Please clarify dose.  Appears there is another rx for labetalol  on chart written in April 2025 that increased the dose to 300 mg TID? from another provider.

## 2023-12-01 ENCOUNTER — Other Ambulatory Visit: Payer: Self-pay

## 2023-12-01 DIAGNOSIS — O0990 Supervision of high risk pregnancy, unspecified, unspecified trimester: Secondary | ICD-10-CM

## 2023-12-01 DIAGNOSIS — Z23 Encounter for immunization: Secondary | ICD-10-CM | POA: Diagnosis not present

## 2023-12-01 MED ORDER — LABETALOL HCL 200 MG PO TABS: 300.0000 mg | ORAL_TABLET | Freq: Three times a day (TID) | ORAL | 0 refills | Status: DC

## 2023-12-01 NOTE — Telephone Encounter (Signed)
 Pt called in to follow up on refill request. She states it has been almost a week since she requested a refill and has not heard anything yet.

## 2023-12-04 ENCOUNTER — Ambulatory Visit (INDEPENDENT_AMBULATORY_CARE_PROVIDER_SITE_OTHER): Admitting: Family Medicine

## 2023-12-04 ENCOUNTER — Encounter: Payer: Self-pay | Admitting: Family Medicine

## 2023-12-04 VITALS — BP 200/120 | HR 70 | Temp 97.1°F | Ht 65.0 in | Wt >= 6400 oz

## 2023-12-04 DIAGNOSIS — Z0000 Encounter for general adult medical examination without abnormal findings: Secondary | ICD-10-CM | POA: Diagnosis not present

## 2023-12-04 DIAGNOSIS — I160 Hypertensive urgency: Secondary | ICD-10-CM

## 2023-12-04 DIAGNOSIS — I10 Essential (primary) hypertension: Secondary | ICD-10-CM | POA: Diagnosis not present

## 2023-12-04 MED ORDER — LABETALOL HCL 200 MG PO TABS: 300.0000 mg | ORAL_TABLET | Freq: Three times a day (TID) | ORAL | 11 refills | Status: DC

## 2023-12-04 NOTE — Patient Instructions (Signed)
 Order for sleep study placed.  Our pharmacist has already gone for the day, but I can ask the question regarding Wegovy  and pregnancy when she is back in the office.

## 2023-12-04 NOTE — Progress Notes (Signed)
 Established Patient Office Visit   Subjective  Patient ID: Shelby Chandler, female    DOB: 12/20/1990  Age: 33 y.o. MRN: [id number]  Chief Complaint  Patient presents with   Annual Exam    Pt is a 33 yo female seen for CPE.  BP uncontrolled.  Pt states she has been out of labetalol  300 mg TID x 5 days.  Pt is not check bp at home.  States feels normal on the medication and maybe having to go to the ED during last OFV that was to have been for CPE was what she needed.  Pt states on the med she no longer had a dull HA and had the energy to work out.  States also felt better when trying to sleep.  Still waking up feeling tired.  In the past a sleep study was ordered, but pt did not have it done.  Pt inquires about restarting We[employer] .  States she was advised that if she wanted to try conceiving again it would be best to be off the med x 1 yr.  Pt states she wants to get back to taking care of herself before trying again.   Not currently on birth control.    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   Anxiety and depression 08/28/2017   Chronic low back pain without sciatica 08/28/2017   History of anemia 08/28/2017   Essential hypertension 01/09/2017   Class 3 severe obesity due to excess calories without serious comorbidity with body mass index (BMI) of 60.0 to 69.9 in adult [facility]) 01/09/2017   Past Medical History:  Diagnosis Date   Anemia     Anxiety    Hypertension    Miscarriage    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   Pre-diabetes    Past Surgical History:  Procedure Laterality Date   CESAREAN SECTION  11/21/2020   Procedure: CESAREAN SECTION;  Surgeon: Fredirick Glenys RAMAN, MD;  Location: [facility];  Service: Obstetrics;;   DILATION AND EVACUATION N/A 06/07/2023   Procedure: DILATION AND EVACUATION, UTERUS WITH CHROMOSOME STUDIES;  Surgeon: Alger Gong, MD;  Location: [facility] OR;  Service: Gynecology;  Laterality: N/A;   OPERATIVE ULTRASOUND N/A 06/07/2023   Procedure: US  INTRAOPERATIVE;  Surgeon: Alger Gong, MD;  Location: [facility] OR;  Service: Gynecology;  Laterality: N/A;   TOOTH EXTRACTION     Social History   Tobacco Use   Smoking status: Never   Smokeless tobacco: Never  Vaping Use   Vaping status: Never Used  Substance Use Topics   Alcohol use: Not Currently    Comment: socially   Drug use: No   Family History  Problem Relation Age of Onset   Alcohol abuse Mother    Arthritis Mother    Depression Mother    Drug abuse Mother    Heart attack Mother    Hypertension Mother    Hyperlipidemia Mother    Alcohol abuse Father    Diabetes Father  Drug abuse Father    Hypertension Father    Heart attack Father    Stroke Father    Hypertension Sister    Arthritis Maternal Grandmother    Arthritis Maternal Grandfather    Diabetes Paternal Grandmother    No Known Allergies  ROS Negative unless stated above    Objective:     BP (!) 200/120 (BP Location: Right Arm, Patient Position: Sitting, Cuff Size: Large)   Pulse 70   Temp (!) 97.1 F (36.2 C) (Oral)   Ht 5' 5 (1.651 m)   Wt (!) 441 lb 9.6 oz (200.3 kg)   LMP  (LMP Unknown)   SpO2 99%   BMI 73.49 kg/m  BP Readings from Last 3 Encounters:  12/04/23 (!) 200/120  09/05/23 (!) 187/110  08/31/23 (!) 184/106   Wt Readings from Last 3 Encounters:  12/04/23 (!) 441 lb 9.6 oz (200.3 kg)  08/31/23 (!) 453 lb (205.5 kg)  08/31/23 (!) 453 lb 9.6 oz (205.8  kg)      Physical Exam Constitutional:      Appearance: Normal appearance. She is obese.  HENT:     Head: Normocephalic and atraumatic.     Right Ear: Tympanic membrane, ear canal and external ear normal.     Left Ear: Tympanic membrane, ear canal and external ear normal.     Nose: Nose normal.     Mouth/Throat:     Mouth: Mucous membranes are moist.     Pharynx: No oropharyngeal exudate or posterior oropharyngeal erythema.  Eyes:     General: No scleral icterus.    Extraocular Movements: Extraocular movements intact.     Conjunctiva/sclera: Conjunctivae normal.     Pupils: Pupils are equal, round, and reactive to light.  Neck:     Thyroid : No thyromegaly.     Vascular: No carotid bruit.  Cardiovascular:     Rate and Rhythm: Normal rate and regular rhythm.     Pulses: Normal pulses.     Heart sounds: Normal heart sounds. No murmur heard.    No friction rub.  Pulmonary:     Effort: Pulmonary effort is normal.     Breath sounds: Normal breath sounds. No wheezing, rhonchi or rales.  Abdominal:     General: Bowel sounds are normal.     Palpations: Abdomen is soft.     Tenderness: There is no abdominal tenderness.  Musculoskeletal:        General: No deformity. Normal range of motion.  Lymphadenopathy:     Cervical: No cervical adenopathy.  Skin:    General: Skin is warm and dry.     Findings: No lesion.  Neurological:     General: No focal deficit present.     Mental Status: She is alert and oriented to person, place, and time.  Psychiatric:        Mood and Affect: Mood normal.        Thought Content: Thought content normal.        08/31/2023    4:13 PM 04/25/2023    9:49 AM 03/08/2023    4:34 PM  Depression screen PHQ 2/9  Decreased Interest 0 0 0  Down, Depressed, Hopeless 0 0 0  PHQ - 2 Score 0 0 0  Altered sleeping 3 2 0  Tired, decreased energy 3 2 3   Change in appetite 3 2 3   Feeling bad or failure about yourself  0 0 0  Trouble concentrating 0 0 0   Moving slowly  or fidgety/restless 0 0 0  Suicidal thoughts 0 0 0  PHQ-9 Score 9 6 6   Difficult doing work/chores   Somewhat difficult      08/31/2023    4:13 PM 04/25/2023    9:50 AM 03/08/2023    4:35 PM 01/25/2023   11:23 AM  GAD 7 : Generalized Anxiety Score  Nervous, Anxious, on Edge 3 0 0 0  Control/stop worrying 0 0 0 0  Worry too much - different things 3 1 0 0  Trouble relaxing 0 1 0 0  Restless 0 0 0 0  Easily annoyed or irritable 2 2 0 0  Afraid - awful might happen 0 0 0 0  Total GAD 7 Score 8 4 0 0  Anxiety Difficulty   Not difficult at all Not difficult at all     No results found for any visits on 12/04/23.    Assessment & Plan:   Well adult exam -     CBC with Differential/Platelet; Future -     Comprehensive metabolic panel with GFR; Future -     Hemoglobin A1c; Future -     Lipid panel; Future -     T4, free; Future -     TSH; Future  Morbid obesity (HCC) -     Hemoglobin A1c; Future -     Lipid panel; Future -     Vitamin B12; Future -     VITAMIN D  25 Hydroxy (Vit-D Deficiency, Fractures); Future -     Ambulatory referral to Sleep Studies  Chronic hypertension -     Comprehensive metabolic panel with GFR; Future -     T4, free; Future -     TSH; Future -     Labetalol  HCl; Take 1.5 tablets (300 mg total) by mouth 3 (three) times daily.  Dispense: 135 tablet; Refill: 11 -     Ambulatory referral to Sleep Studies  Hypertensive urgency  Age appropriate health screenings.  Labs.  Pap with OB/Gyn.  Discussed the importance of medication compliance and continuing lifestyle modifications.  Consider new referral to wt management. Sleep study referral placed.  Pt encouraged to keep appt as OSA likely contributing to HTN.    Return in about 1 month (around 01/04/2024) for blood pressure.   Clotilda JONELLE Single, MD

## 2023-12-05 LAB — CBC WITH DIFFERENTIAL/PLATELET
Basophils Absolute: 0.1 K/uL (ref 0.0–0.1)
Basophils Relative: 1 % (ref 0.0–3.0)
Eosinophils Absolute: 0.3 K/uL (ref 0.0–0.7)
Eosinophils Relative: 4.4 % (ref 0.0–5.0)
HCT: 34.3 % — ABNORMAL LOW (ref 36.0–46.0)
Hemoglobin: 10.9 g/dL — ABNORMAL LOW (ref 12.0–15.0)
Lymphocytes Relative: 35.9 % (ref 12.0–46.0)
Lymphs Abs: 2.2 K/uL (ref 0.7–4.0)
MCHC: 31.7 g/dL (ref 30.0–36.0)
MCV: 71.5 fl — ABNORMAL LOW (ref 78.0–100.0)
Monocytes Absolute: 0.5 K/uL (ref 0.1–1.0)
Monocytes Relative: 7.9 % (ref 3.0–12.0)
Neutro Abs: 3.1 K/uL (ref 1.4–7.7)
Neutrophils Relative %: 50.8 % (ref 43.0–77.0)
Platelets: 428 K/uL — ABNORMAL HIGH (ref 150.0–400.0)
RBC: 4.8 Mil/uL (ref 3.87–5.11)
RDW: 16.5 % — ABNORMAL HIGH (ref 11.5–15.5)
WBC: 6 K/uL (ref 4.0–10.5)

## 2023-12-05 LAB — VITAMIN B12: Vitamin B-12: 282 pg/mL (ref 211–911)

## 2023-12-05 LAB — VITAMIN D 25 HYDROXY (VIT D DEFICIENCY, FRACTURES): VITD: 19.42 ng/mL — ABNORMAL LOW (ref 30.00–100.00)

## 2023-12-05 LAB — T4, FREE: Free T4: 0.7 ng/dL (ref 0.60–1.60)

## 2023-12-05 LAB — COMPREHENSIVE METABOLIC PANEL WITH GFR
ALT: 13 U/L (ref 0–35)
AST: 14 U/L (ref 0–37)
Albumin: 4.1 g/dL (ref 3.5–5.2)
Alkaline Phosphatase: 51 U/L (ref 39–117)
BUN: 8 mg/dL (ref 6–23)
CO2: 26 meq/L (ref 19–32)
Calcium: 9 mg/dL (ref 8.4–10.5)
Chloride: 101 meq/L (ref 96–112)
Creatinine, Ser: 0.82 mg/dL (ref 0.40–1.20)
GFR: 93.42 mL/min (ref >60.00)
Glucose, Bld: 70 mg/dL (ref 70–99)
Potassium: 3.9 meq/L (ref 3.5–5.1)
Sodium: 136 meq/L (ref 135–145)
Total Bilirubin: 0.3 mg/dL (ref 0.2–1.2)
Total Protein: 7.7 g/dL (ref 6.0–8.3)

## 2023-12-05 LAB — LIPID PANEL
Cholesterol: 167 mg/dL (ref 0–200)
HDL: 44.5 mg/dL (ref >39.00)
LDL Cholesterol: 104 mg/dL — ABNORMAL HIGH (ref 0–99)
NonHDL: 122.22
Total CHOL/HDL Ratio: 4
Triglycerides: 90 mg/dL (ref 0.0–149.0)
VLDL: 18 mg/dL (ref 0.0–40.0)

## 2023-12-05 LAB — TSH: TSH: 2.35 u[IU]/mL (ref 0.35–5.50)

## 2023-12-05 LAB — HEMOGLOBIN A1C: Hgb A1c MFr Bld: 5.7 % (ref 4.6–6.5)

## 2023-12-14 ENCOUNTER — Ambulatory Visit: Payer: Self-pay | Admitting: Family Medicine

## 2023-12-14 DIAGNOSIS — E559 Vitamin D deficiency, unspecified: Secondary | ICD-10-CM

## 2023-12-14 MED ORDER — VITAMIN D (ERGOCALCIFEROL) 1.25 MG (50000 UNIT) PO CAPS: 50000.0000 [IU] | ORAL_CAPSULE | ORAL | 0 refills | Status: AC

## 2024-01-14 DIAGNOSIS — G4730 Sleep apnea, unspecified: Secondary | ICD-10-CM | POA: Diagnosis not present

## 2024-01-31 ENCOUNTER — Telehealth: Payer: Self-pay

## 2024-01-31 NOTE — Progress Notes (Addendum)
" ° °  01/31/2024  Patient ID: Shelby Chandler, female   DOB: [birth year], 33 y.o.   MRN: [id number]  Pharmacy Quality Measure Review  This patient is appearing on a report for being at risk of failing the Controlling Blood Pressure measure this calendar year.   Last documented BP 200/120 on 12/04/23  Spoke with patient. She has not been checking BP at home regularly because she is trying to get an extended cuff size for her arm. Discussed scheduling f/u, she is under the weather at this time and would like to wait til Jan. Sch for 1/16  Jon VEAR [place], PharmD Clinical Pharmacist [phone]    "

## 2024-02-23 ENCOUNTER — Ambulatory Visit: Admitting: Family Medicine

## 2024-02-23 ENCOUNTER — Encounter: Payer: Self-pay | Admitting: Family Medicine

## 2024-02-23 VITALS — BP 168/90 | HR 80 | Temp 98.3°F | Ht 65.0 in | Wt >= 6400 oz

## 2024-02-23 DIAGNOSIS — G4730 Sleep apnea, unspecified: Secondary | ICD-10-CM | POA: Diagnosis not present

## 2024-02-23 DIAGNOSIS — O10919 Unspecified pre-existing hypertension complicating pregnancy, unspecified trimester: Secondary | ICD-10-CM

## 2024-02-23 DIAGNOSIS — Z3490 Encounter for supervision of normal pregnancy, unspecified, unspecified trimester: Secondary | ICD-10-CM

## 2024-02-23 DIAGNOSIS — Z6841 Body Mass Index (BMI) 40.0 and over, adult: Secondary | ICD-10-CM

## 2024-02-23 MED ORDER — LABETALOL HCL 200 MG PO TABS: 400.0000 mg | ORAL_TABLET | Freq: Three times a day (TID) | ORAL | 1 refills | Status: AC

## 2024-02-23 NOTE — Progress Notes (Signed)
 "  Established Patient Office Visit   Subjective  Patient ID: Shelby Chandler, female    DOB: 10-04-1990  Age: 34 y.o. MRN: [id number]  No chief complaint on file. Pt accompanied by her husband.  Pt is a 34 year old female seen for follow-up.  Patient states she is [redacted] weeks pregnant.  Currently followed by Dr. Laurence, OB/[facility] at [facility].  Taking labetalol  300 mg 3 times daily for HTN.  Patient states she was making changes in diet and exercising but did not notice changes in weight or BP.  Patient frustrated regarding weight gain likely with pregnancy.  Patient had sleep study that noted mild OSA.    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   Pre-diabetes    Past Surgical History:  Procedure Laterality Date   CESAREAN SECTION  11/21/2020   Procedure: CESAREAN SECTION;  Surgeon: Fredirick Glenys RAMAN, MD;  Location: [facility];  Service: Obstetrics;;   DILATION AND EVACUATION N/A 06/07/2023   Procedure: DILATION AND  EVACUATION, UTERUS WITH CHROMOSOME STUDIES;  Surgeon: Alger Gong, MD;  Location: [facility] OR;  Service: Gynecology;  Laterality: N/A;   OPERATIVE ULTRASOUND N/A 06/07/2023   Procedure: US  INTRAOPERATIVE;  Surgeon: Alger Gong, MD;  Location: [facility] OR;  Service: Gynecology;  Laterality: N/A;   TOOTH EXTRACTION     Social History[1] Family History  Problem Relation Age of Onset   Alcohol abuse Mother    Arthritis Mother    Depression Mother    Drug abuse Mother    Heart attack Mother    Hypertension Mother    Hyperlipidemia Mother    Alcohol abuse Father    Diabetes Father    Drug abuse Father    Hypertension Father    Heart attack Father    Stroke Father    Hypertension Sister    Arthritis Maternal Grandmother    Arthritis Maternal Grandfather    Diabetes Paternal Grandmother    Allergies[2]  ROS Negative unless stated above    Objective:     There were no vitals taken for this visit. BP Readings from Last 3 Encounters:  12/04/23 (!) 200/120  09/05/23 (!) 187/110  08/31/23 (!) 184/106   Wt Readings from Last 3 Encounters:  12/04/23 (!) 441 lb 9.6 oz (200.3 kg)  08/31/23 (!) 453 lb (205.5 kg)  08/31/23 (!) 453 lb 9.6 oz (205.8 kg)      Physical Exam Constitutional:      General: She is not in acute distress.  Appearance: Normal appearance. She is obese.  HENT:     Head: Normocephalic and atraumatic.     Nose: Nose normal.     Mouth/Throat:     Mouth: Mucous membranes are moist.  Cardiovascular:     Rate and Rhythm: Normal rate and regular rhythm.     Heart sounds: Normal heart sounds. No murmur heard.    No gallop.  Pulmonary:     Effort: Pulmonary effort is normal. No respiratory distress.     Breath sounds: Normal breath sounds. No wheezing, rhonchi or rales.  Skin:    General: Skin is warm and dry.  Neurological:     Mental Status: She is alert and oriented to person, place, and time.        08/31/2023    4:13 PM 04/25/2023    9:49 AM  03/08/2023    4:34 PM  Depression screen PHQ 2/9  Decreased Interest 0 0 0  Down, Depressed, Hopeless 0 0 0  PHQ - 2 Score 0 0 0  Altered sleeping 3 2 0  Tired, decreased energy 3 2 3   Change in appetite 3 2 3   Feeling bad or failure about yourself  0 0 0  Trouble concentrating 0 0 0  Moving slowly or fidgety/restless 0 0 0  Suicidal thoughts 0 0 0  PHQ-9 Score 9  6  6    Difficult doing work/chores   Somewhat difficult     Data saved with a previous flowsheet row definition      08/31/2023    4:13 PM 04/25/2023    9:50 AM 03/08/2023    4:35 PM 01/25/2023   11:23 AM  GAD 7 : Generalized Anxiety Score  Nervous, Anxious, on Edge 3 0 0 0  Control/stop worrying 0 0 0 0  Worry too much - different things 3 1 0 0  Trouble relaxing 0 1 0 0  Restless 0 0 0 0  Easily annoyed or irritable 2 2 0 0  Afraid - awful might happen 0 0 0 0  Total GAD 7 Score 8 4 0 0  Anxiety Difficulty   Not difficult at all Not difficult at all     No results found for any visits on 02/23/24.    Assessment & Plan:   Chronic hypertension in pregnancy -     Labetalol  HCl; Take 2 tablets (400 mg total) by mouth 3 (three) times daily.  Dispense: 540 tablet; Refill: 1  Gravida 7, currently pregnant  Morbid obesity (HCC)  Mild sleep apnea   Uncontrolled chronic HTN in pregnancy.  Currently [redacted] weeks pregnant.  Will DC labetalol  300 mg 3 times daily.  Start labetalol  400 mg 3 times daily.  Advised max dose 2400 mg/day in case further adjustments are needed.  Patient encouraged to continue lifestyle modifications.  Advised on likely need to see high risk OB provider.  Mild sleep apnea noted on sleep study.  Will look into getting patient set up with a CPAP.  Given strict precautions.  Return in about 4 weeks (around 03/22/2024).   Clotilda JONELLE Single, MD     [1]  Social History Tobacco Use   Smoking status: Never   Smokeless tobacco: Never  Vaping Use   Vaping status: Never Used  Substance Use Topics    Alcohol use: Not Currently    Comment: socially   Drug use: No  [2] No Known Allergies  "

## 2024-02-29 ENCOUNTER — Telehealth: Payer: Self-pay

## 2024-02-29 NOTE — Telephone Encounter (Signed)
 [facility] order has been faxed to [facility]

## 2024-03-08 ENCOUNTER — Telehealth: Payer: Self-pay | Admitting: Family Medicine

## 2024-03-08 NOTE — Telephone Encounter (Signed)
 Copied from CRM #[id number]. Topic: General - Other >> Mar 08, 2024  3:29 PM China J wrote: Reason for CRM: Chiquita calling from [facility] to let Dr. Mercer know that the prescription sent over for the CPAP was sent without a signature or date. She is needing this re-faxed to [phone].

## 2024-03-08 NOTE — Telephone Encounter (Signed)
 Faxed.
